# Patient Record
Sex: Female | Born: 1950 | Race: White | Hispanic: No | Marital: Married | Smoking: Never smoker
Health system: Southern US, Community
[De-identification: ages and names within clinical notes are randomized; demographics above are authoritative.]

## PROBLEM LIST (undated history)

## (undated) DIAGNOSIS — R011 Cardiac murmur, unspecified: Secondary | ICD-10-CM

## (undated) DIAGNOSIS — M1990 Unspecified osteoarthritis, unspecified site: Secondary | ICD-10-CM

## (undated) DIAGNOSIS — K219 Gastro-esophageal reflux disease without esophagitis: Secondary | ICD-10-CM

## (undated) DIAGNOSIS — D649 Anemia, unspecified: Secondary | ICD-10-CM

## (undated) DIAGNOSIS — I350 Nonrheumatic aortic (valve) stenosis: Secondary | ICD-10-CM

## (undated) DIAGNOSIS — E7800 Pure hypercholesterolemia, unspecified: Secondary | ICD-10-CM

## (undated) DIAGNOSIS — R931 Abnormal findings on diagnostic imaging of heart and coronary circulation: Secondary | ICD-10-CM

## (undated) DIAGNOSIS — I10 Essential (primary) hypertension: Secondary | ICD-10-CM

## (undated) DIAGNOSIS — I509 Heart failure, unspecified: Secondary | ICD-10-CM

## (undated) DIAGNOSIS — E119 Type 2 diabetes mellitus without complications: Secondary | ICD-10-CM

## (undated) DIAGNOSIS — Z95820 Peripheral vascular angioplasty status with implants and grafts: Secondary | ICD-10-CM

## (undated) DIAGNOSIS — I5030 Unspecified diastolic (congestive) heart failure: Secondary | ICD-10-CM

## (undated) DIAGNOSIS — I2510 Atherosclerotic heart disease of native coronary artery without angina pectoris: Secondary | ICD-10-CM

## (undated) DIAGNOSIS — G43909 Migraine, unspecified, not intractable, without status migrainosus: Secondary | ICD-10-CM

## (undated) DIAGNOSIS — Z9289 Personal history of other medical treatment: Secondary | ICD-10-CM

## (undated) HISTORY — DX: Heart failure, unspecified: I50.9

## (undated) HISTORY — PX: BREAST BIOPSY: SHX20

## (undated) HISTORY — PX: CARDIAC CATHETERIZATION: SHX172

---
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
## 2004-11-29 ENCOUNTER — Other Ambulatory Visit: Admission: RE | Admit: 2004-11-29 | Discharge: 2004-11-29 | Payer: Self-pay | Admitting: Obstetrics and Gynecology

## 2004-12-08 ENCOUNTER — Ambulatory Visit (HOSPITAL_COMMUNITY): Admission: RE | Admit: 2004-12-08 | Discharge: 2004-12-08 | Payer: Self-pay | Admitting: Unknown Physician Specialty

## 2006-02-26 ENCOUNTER — Ambulatory Visit (HOSPITAL_COMMUNITY): Admission: RE | Admit: 2006-02-26 | Discharge: 2006-02-26 | Payer: Self-pay | Admitting: Obstetrics and Gynecology

## 2006-07-17 ENCOUNTER — Other Ambulatory Visit: Admission: RE | Admit: 2006-07-17 | Discharge: 2006-07-17 | Payer: Self-pay | Admitting: Obstetrics and Gynecology

## 2006-08-17 ENCOUNTER — Ambulatory Visit: Payer: Self-pay | Admitting: Gastroenterology

## 2007-04-26 ENCOUNTER — Ambulatory Visit (HOSPITAL_COMMUNITY): Admission: RE | Admit: 2007-04-26 | Discharge: 2007-04-26 | Payer: Self-pay | Admitting: Obstetrics and Gynecology

## 2007-08-13 ENCOUNTER — Other Ambulatory Visit: Admission: RE | Admit: 2007-08-13 | Discharge: 2007-08-13 | Payer: Self-pay | Admitting: Obstetrics and Gynecology

## 2008-04-30 ENCOUNTER — Ambulatory Visit (HOSPITAL_COMMUNITY): Admission: RE | Admit: 2008-04-30 | Discharge: 2008-04-30 | Payer: Self-pay | Admitting: Obstetrics and Gynecology

## 2009-05-12 ENCOUNTER — Ambulatory Visit (HOSPITAL_COMMUNITY): Admission: RE | Admit: 2009-05-12 | Discharge: 2009-05-12 | Payer: Self-pay | Admitting: Obstetrics and Gynecology

## 2010-05-16 ENCOUNTER — Ambulatory Visit (HOSPITAL_COMMUNITY): Admission: RE | Admit: 2010-05-16 | Discharge: 2010-05-16 | Payer: Self-pay | Admitting: Obstetrics and Gynecology

## 2011-05-10 ENCOUNTER — Other Ambulatory Visit: Payer: Self-pay | Admitting: Obstetrics and Gynecology

## 2011-05-10 DIAGNOSIS — Z1231 Encounter for screening mammogram for malignant neoplasm of breast: Secondary | ICD-10-CM

## 2011-05-25 ENCOUNTER — Ambulatory Visit (HOSPITAL_COMMUNITY)
Admission: RE | Admit: 2011-05-25 | Discharge: 2011-05-25 | Disposition: A | Discharge status: Home / Self Care | Payer: BC Managed Care – PPO | Source: Ambulatory Visit | Attending: Obstetrics and Gynecology | Admitting: Obstetrics and Gynecology

## 2011-05-25 DIAGNOSIS — Z1231 Encounter for screening mammogram for malignant neoplasm of breast: Secondary | ICD-10-CM | POA: Insufficient documentation

## 2012-05-07 ENCOUNTER — Other Ambulatory Visit: Payer: Self-pay | Admitting: Obstetrics and Gynecology

## 2012-05-07 DIAGNOSIS — Z1231 Encounter for screening mammogram for malignant neoplasm of breast: Secondary | ICD-10-CM

## 2012-05-28 ENCOUNTER — Ambulatory Visit (HOSPITAL_COMMUNITY)
Admission: RE | Admit: 2012-05-28 | Discharge: 2012-05-28 | Disposition: A | Discharge status: Home / Self Care | Payer: BC Managed Care – PPO | Source: Ambulatory Visit | Attending: Obstetrics and Gynecology | Admitting: Obstetrics and Gynecology

## 2012-05-28 DIAGNOSIS — Z1231 Encounter for screening mammogram for malignant neoplasm of breast: Secondary | ICD-10-CM | POA: Insufficient documentation

## 2013-04-22 ENCOUNTER — Encounter: Payer: Self-pay | Admitting: Obstetrics and Gynecology

## 2013-04-28 ENCOUNTER — Encounter: Payer: Self-pay | Admitting: Gynecology

## 2013-04-28 ENCOUNTER — Ambulatory Visit: Payer: Self-pay | Admitting: Obstetrics and Gynecology

## 2013-04-28 ENCOUNTER — Ambulatory Visit (INDEPENDENT_AMBULATORY_CARE_PROVIDER_SITE_OTHER): Payer: BC Managed Care – PPO | Admitting: Gynecology

## 2013-04-28 VITALS — BP 140/80 | HR 80 | Resp 14 | Ht 62.0 in | Wt 195.0 lb

## 2013-04-28 DIAGNOSIS — I10 Essential (primary) hypertension: Secondary | ICD-10-CM

## 2013-04-28 DIAGNOSIS — Z01419 Encounter for gynecological examination (general) (routine) without abnormal findings: Secondary | ICD-10-CM

## 2013-04-28 DIAGNOSIS — Z124 Encounter for screening for malignant neoplasm of cervix: Secondary | ICD-10-CM

## 2013-04-28 DIAGNOSIS — E7800 Pure hypercholesterolemia, unspecified: Secondary | ICD-10-CM | POA: Insufficient documentation

## 2013-04-28 NOTE — Progress Notes (Signed)
62 y.o. Married Caucasian female   [phone] here for annual exam. Pt reports menses absent.  She does not report hot flashes, does not have night sweats, does have vaginal dryness.  She is not using lubricants.  She does not report post-menopasual bleeding.  Pt had never filled estrace cream due to lack of sexual activity.  Prior to hurting knee, pt used to clog on regular basis.    Patient's last menstrual period was 08/07/1998.          Sexually active: yes  The current method of family planning is status post menopausal. Exercising: yes  The patient does not participate in regular exercise at present. Last pap: 01/14/2010 Abnormal PAP: no Mammogram: 05/2012 BSE: not monthly  Colonoscopy:  2010 DEXA: no Alcohol: 3-4 drinks/wk Tobacco: no  Hgb: PCP ; Urine: PCP  Health Maintenance  Topic Date Due  . Pap Smear  05/28/1968  . Colonoscopy  05/28/2000  . Zostavax  05/28/2010  . Influenza Vaccine  03/07/2013  . Mammogram  05/28/2014  . Tetanus/tdap  08/07/2017    Family History  Problem Relation Age of Onset  . Hypertension Mother   . Heart disease Mother     There are no active problems to display for this patient.   History reviewed. No pertinent past medical history.  Past Surgical History  Procedure Laterality Date  . Appendectomy    . Breast biopsy Right     2- benign  . Stomach surgery  1965    Hairball removed from stomach    Allergies: Review of patient's allergies indicates no known allergies.  Current Outpatient Prescriptions  Medication Sig Dispense Refill  . CALCIUM PO Take by mouth 2 (two) times daily.      . Cholecalciferol (VITAMIN D) 2000 UNITS CAPS Take by mouth.      . IBUPROFEN PO Take by mouth.      . Multiple Vitamins-Minerals (MULTIVITAMIN PO) Take by mouth.      Marland Kitchen lisinopril-hydrochlorothiazide (PRINZIDE,ZESTORETIC) 20-12.5 MG per tablet       . SIMVASTATIN PO Take by mouth.       No current facility-administered medications for this visit.     ROS: Pertinent items are noted in HPI.  Exam:    BP 140/80  Pulse 80  Resp 14  Ht 5\' 2"  (1.575 m)  Wt 195 lb (88.451 kg)  BMI 35.66 kg/m2  LMP 08/07/1998 Weight change: @WEIGHTCHANGE @ Last 3 height recordings:  Ht Readings from Last 3 Encounters:  04/28/13 5\' 2"  (1.575 m)   General appearance: alert, cooperative and appears stated age Head: Normocephalic, without obvious abnormality, atraumatic Neck: no adenopathy, no carotid bruit, no JVD, supple, symmetrical, trachea midline and thyroid not enlarged, symmetric, no tenderness/mass/nodules Lungs: clear to auscultation bilaterally Breasts: normal appearance, no masses or tenderness Heart: regular rate and rhythm, holosystolic flow murmur II/IV mitral Abdomen: soft, non-tender; bowel sounds normal; no masses,  no organomegaly Extremities: extremities normal, atraumatic, no cyanosis or edema Skin: Skin color, texture, turgor normal. No rashes or lesions Lymph nodes: Cervical, supraclavicular, and axillary nodes normal. no inguinal nodes palpated Neurologic: Grossly normal   Pelvic: External genitalia:  atrophic appearance              Urethra: normal appearing urethra with no masses, tenderness or lesions              Bartholins and Skenes: Bartholin's, Urethra, Skene's normal  Vagina: atrophic              Cervix: normal appearance              Pap taken: yes        Bimanual Exam:  Uterus:  uterus is normal size, shape, consistency and nontender                                      Adnexa:    no masses                                      Rectovaginal: Confirms                                      Anus:  normal sphincter tone, no lesions  A: well woman Menopause Mitral flow murmur     P: mammogram due, BSE stressed pap smear with HRHPV-guidelines reviewed Pt with echo-5y before, sees Dr Kevan Ny, no antibiotic prophylaxis counseled on breast self exam, mammography screening, menopause, osteoporosis,  adequate intake of calcium and vitamin D, diet and exercise return annually or prn Discussed PAP guideline changes, importance of weight bearing exercises, calcium, vit D and balanced diet.  An After Visit Summary was printed and given to the patient.

## 2013-04-28 NOTE — Patient Instructions (Signed)

## 2013-05-07 ENCOUNTER — Other Ambulatory Visit: Payer: Self-pay | Admitting: Gynecology

## 2013-05-07 DIAGNOSIS — Z1231 Encounter for screening mammogram for malignant neoplasm of breast: Secondary | ICD-10-CM

## 2013-06-05 ENCOUNTER — Other Ambulatory Visit: Payer: Self-pay | Admitting: Orthopedic Surgery

## 2013-06-05 NOTE — Progress Notes (Signed)
Preoperative surgical orders have been place into the Epic hospital system for Kerry Perez on 06/05/2013, 1:01 PM  by Patrica Duel for surgery on 06/18/2013.  Preop Knee Scope orders including IV Tylenol and IV Decadron as long as there are no contraindications to the above medications. Avel Peace, PA-C

## 2013-06-12 ENCOUNTER — Encounter (HOSPITAL_COMMUNITY): Payer: Self-pay | Admitting: Pharmacy Technician

## 2013-06-13 ENCOUNTER — Encounter (HOSPITAL_COMMUNITY): Payer: Self-pay

## 2013-06-13 ENCOUNTER — Ambulatory Visit (HOSPITAL_COMMUNITY)
Admission: RE | Admit: 2013-06-13 | Discharge: 2013-06-13 | Disposition: A | Discharge status: Home / Self Care | Payer: BC Managed Care – PPO | Source: Ambulatory Visit | Attending: Orthopedic Surgery | Admitting: Orthopedic Surgery

## 2013-06-13 ENCOUNTER — Ambulatory Visit (HOSPITAL_COMMUNITY): Admission: RE | Admit: 2013-06-13 | Payer: BC Managed Care – PPO | Source: Ambulatory Visit

## 2013-06-13 ENCOUNTER — Encounter (HOSPITAL_COMMUNITY): Payer: Self-pay | Admitting: *Deleted

## 2013-06-13 ENCOUNTER — Encounter (HOSPITAL_COMMUNITY)
Admission: RE | Admit: 2013-06-13 | Discharge: 2013-06-13 | Disposition: A | Discharge status: Home / Self Care | Payer: BC Managed Care – PPO | Source: Ambulatory Visit | Attending: Orthopedic Surgery | Admitting: Orthopedic Surgery

## 2013-06-13 DIAGNOSIS — IMO0002 Reserved for concepts with insufficient information to code with codable children: Secondary | ICD-10-CM | POA: Insufficient documentation

## 2013-06-13 DIAGNOSIS — Z01810 Encounter for preprocedural cardiovascular examination: Secondary | ICD-10-CM | POA: Insufficient documentation

## 2013-06-13 DIAGNOSIS — Z01812 Encounter for preprocedural laboratory examination: Secondary | ICD-10-CM | POA: Insufficient documentation

## 2013-06-13 DIAGNOSIS — X58XXXA Exposure to other specified factors, initial encounter: Secondary | ICD-10-CM | POA: Insufficient documentation

## 2013-06-13 DIAGNOSIS — M4130 Thoracogenic scoliosis, site unspecified: Secondary | ICD-10-CM | POA: Insufficient documentation

## 2013-06-13 DIAGNOSIS — Z01818 Encounter for other preprocedural examination: Secondary | ICD-10-CM | POA: Insufficient documentation

## 2013-06-13 LAB — BASIC METABOLIC PANEL
BUN: 12 mg/dL (ref 6–23)
Calcium: 9.5 mg/dL (ref 8.4–10.5)
Creatinine, Ser: 0.63 mg/dL (ref 0.50–1.10)
GFR calc Af Amer: >90 mL/min (ref >90)

## 2013-06-13 LAB — CBC
MCH: 29.6 pg (ref 26.0–34.0)
MCHC: 33.9 g/dL (ref 30.0–36.0)
MCV: 87.4 fL (ref 78.0–100.0)
Platelets: 327 10*3/uL (ref 150–400)
RDW: 13 % (ref 11.5–15.5)

## 2013-06-13 NOTE — Patient Instructions (Addendum)
20 SOPHEE MCKIMMY  06/13/2013   Your procedure is scheduled on:  11-12 -2014  Report to [facility] at      0800  AM .  Call this number if you have problems the morning of surgery: [phone]  Or Presurgical Testing [phone](Asalee Barrette)   .   Do not eat food:After Midnight.   Take these medicines the morning of surgery with A SIP OF WATER: none   Do not wear jewelry, make-up or nail polish.  Do not wear lotions, powders, or perfumes. You may wear deodorant.  Do not shave 12 hours prior to first CHG shower(legs and under arms).(face and neck okay.)  Do not bring valuables to the hospital.  Contacts, dentures or removable bridgework, body piercing, hair pins may not be worn into surgery.  Leave suitcase in the car. After surgery it may be brought to your room.  For patients admitted to the hospital, checkout time is 11:00 AM the day of discharge.   Patients discharged the day of surgery will not be allowed to drive home. Must have responsible person with you x 24 hours once discharged.  Name and phone number of your driver: Council Mechanic- spouse [phone] cell  Special Instructions: CHG(Chlorhedine 4%-"Hibiclens","Betasept","Aplicare") Shower Use Special Wash: see special instructions.(avoid face and genitals)   Please read over the following fact sheets that you were given: Incentive Spirometry Instruction.    Failure to follow these instructions may result in Cancellation of your surgery.   Patient signature_______________________________________________________

## 2013-06-13 NOTE — Progress Notes (Signed)
06-13-13 1610 labs viewable in Epic-please note.

## 2013-06-13 NOTE — Pre-Procedure Instructions (Addendum)
06-13-13 EKG/ CXR done today 06-13-13 1610 labs viewable in epic-note to Dr. Deri Fuelling office.

## 2013-06-17 DIAGNOSIS — S83249A Other tear of medial meniscus, current injury, unspecified knee, initial encounter: Secondary | ICD-10-CM

## 2013-06-17 NOTE — H&P (Signed)
  CC- Kerry Perez is a 62 y.o. female who presents with left knee pain.  HPI- . Knee Pain: Patient presents with knee pain involving the  left knee. Onset of the symptoms was several months ago. Inciting event: none known. Current symptoms include giving out, locking, pain located medially and stiffness. Pain is aggravated by lateral movements, pivoting, rising after sitting and squatting.  Patient has had prior knee problems. Evaluation to date: MRI: abnormal medial meniscalt tear. Treatment to date: corticosteroid injection which was not very effective.  Past Medical History  Diagnosis Date  . Hypertension   . Heart murmur   . GERD (gastroesophageal reflux disease)     mild  . Hypercholesterolemia   . Headache(784.0)     migraines, less frequent now  . Arthritis     ? knee issues    Past Surgical History  Procedure Laterality Date  . Appendectomy    . Breast biopsy Right     2- benign  . Stomach surgery  1965    Hairball removed from stomach    Prior to Admission medications   Medication Sig Start Date End Date Taking? Authorizing Provider  calcium-vitamin D (OSCAL WITH D) 500-200 MG-UNIT per tablet Take 1 tablet by mouth 2 (two) times daily.    Historical Provider, MD  Cholecalciferol (VITAMIN D) 2000 UNITS CAPS Take 1 capsule by mouth daily.     Historical Provider, MD  ibuprofen (ADVIL,MOTRIN) 200 MG tablet Take 400 mg by mouth every 6 (six) hours as needed for headache.    Historical Provider, MD  lisinopril-hydrochlorothiazide (PRINZIDE,ZESTORETIC) 20-12.5 MG per tablet Take 1 tablet by mouth every morning.  04/08/13   Historical Provider, MD  Multiple Vitamins-Minerals (MULTIVITAMIN PO) Take 1 tablet by mouth daily.     Historical Provider, MD  simvastatin (ZOCOR) 20 MG tablet Take 20 mg by mouth at bedtime.    Historical Provider, MD   KNEE EXAM antalgic gait, soft tissue tenderness over medial joint line, no effusion, negative drawer sign, collateral ligaments  intact  Physical Examination: General appearance - alert, well appearing, and in no distress Mental status - alert, oriented to person, place, and time Chest - clear to auscultation, no wheezes, rales or rhonchi, symmetric air entry Heart - normal rate, regular rhythm, normal S1, S2, no murmurs, rubs, clicks or gallops Abdomen - soft, nontender, nondistended, no masses or organomegaly Neurological - alert, oriented, normal speech, no focal findings or movement disorder noted   Asessment/Plan--- Left knee medial meniscal tear- - Plan left knee arthroscopy with meniscal debridement. Procedure risks and potential comps discussed with patient who elects to proceed. Goals are decreased pain and increased function with a high likelihood of achieving both

## 2013-06-18 ENCOUNTER — Encounter (HOSPITAL_COMMUNITY)
Admission: RE | Disposition: A | Discharge status: Home / Self Care | Payer: Self-pay | Source: Ambulatory Visit | Attending: Orthopedic Surgery

## 2013-06-18 ENCOUNTER — Ambulatory Visit (HOSPITAL_COMMUNITY)
Admission: RE | Admit: 2013-06-18 | Discharge: 2013-06-18 | Disposition: A | Discharge status: Home / Self Care | Payer: BC Managed Care – PPO | Source: Ambulatory Visit | Attending: Orthopedic Surgery | Admitting: Orthopedic Surgery

## 2013-06-18 ENCOUNTER — Encounter (HOSPITAL_COMMUNITY): Payer: BC Managed Care – PPO | Admitting: Anesthesiology

## 2013-06-18 ENCOUNTER — Encounter (HOSPITAL_COMMUNITY): Payer: Self-pay | Admitting: *Deleted

## 2013-06-18 ENCOUNTER — Ambulatory Visit (HOSPITAL_COMMUNITY): Payer: BC Managed Care – PPO | Admitting: Anesthesiology

## 2013-06-18 DIAGNOSIS — Z79899 Other long term (current) drug therapy: Secondary | ICD-10-CM | POA: Insufficient documentation

## 2013-06-18 DIAGNOSIS — M2240 Chondromalacia patellae, unspecified knee: Secondary | ICD-10-CM | POA: Insufficient documentation

## 2013-06-18 DIAGNOSIS — S83242A Other tear of medial meniscus, current injury, left knee, initial encounter: Secondary | ICD-10-CM

## 2013-06-18 DIAGNOSIS — X58XXXA Exposure to other specified factors, initial encounter: Secondary | ICD-10-CM | POA: Insufficient documentation

## 2013-06-18 DIAGNOSIS — S83249A Other tear of medial meniscus, current injury, unspecified knee, initial encounter: Secondary | ICD-10-CM

## 2013-06-18 DIAGNOSIS — I10 Essential (primary) hypertension: Secondary | ICD-10-CM | POA: Insufficient documentation

## 2013-06-18 DIAGNOSIS — E7800 Pure hypercholesterolemia, unspecified: Secondary | ICD-10-CM | POA: Insufficient documentation

## 2013-06-18 DIAGNOSIS — IMO0002 Reserved for concepts with insufficient information to code with codable children: Secondary | ICD-10-CM | POA: Insufficient documentation

## 2013-06-18 DIAGNOSIS — K219 Gastro-esophageal reflux disease without esophagitis: Secondary | ICD-10-CM | POA: Insufficient documentation

## 2013-06-18 HISTORY — DX: Gastro-esophageal reflux disease without esophagitis: K21.9

## 2013-06-18 HISTORY — DX: Pure hypercholesterolemia, unspecified: E78.00

## 2013-06-18 HISTORY — DX: Cardiac murmur, unspecified: R01.1

## 2013-06-18 HISTORY — DX: Essential (primary) hypertension: I10

## 2013-06-18 HISTORY — PX: KNEE ARTHROSCOPY: SHX127

## 2013-06-18 HISTORY — DX: Unspecified osteoarthritis, unspecified site: M19.90

## 2013-06-18 SURGERY — ARTHROSCOPY, KNEE
Anesthesia: General | Site: Knee | Laterality: Left | Wound class: Clean

## 2013-06-18 MED ORDER — BUPIVACAINE-EPINEPHRINE 0.25% -1:200000 IJ SOLN
INTRAMUSCULAR | Status: DC | PRN
  Administered 2013-06-18: 30 mL

## 2013-06-18 MED ORDER — LACTATED RINGERS IV SOLN: INTRAVENOUS | Status: DC

## 2013-06-18 MED ORDER — FENTANYL CITRATE 0.05 MG/ML IJ SOLN
INTRAMUSCULAR | Status: DC | PRN
  Administered 2013-06-18: 25 ug via INTRAVENOUS
  Administered 2013-06-18: 50 ug via INTRAVENOUS
  Administered 2013-06-18: 25 ug via INTRAVENOUS

## 2013-06-18 MED ORDER — LACTATED RINGERS IR SOLN
Status: DC | PRN
  Administered 2013-06-18: 6000 mL

## 2013-06-18 MED ORDER — LACTATED RINGERS IV SOLN
INTRAVENOUS | Status: DC
  Administered 2013-06-18: 1000 mL via INTRAVENOUS

## 2013-06-18 MED ORDER — ONDANSETRON HCL 4 MG/2ML IJ SOLN
INTRAMUSCULAR | Status: DC | PRN
  Administered 2013-06-18: 4 mg via INTRAVENOUS

## 2013-06-18 MED ORDER — PROMETHAZINE HCL 25 MG/ML IJ SOLN: 6.2500 mg | INTRAMUSCULAR | Status: DC | PRN

## 2013-06-18 MED ORDER — MEPERIDINE HCL 50 MG/ML IJ SOLN: 6.2500 mg | INTRAMUSCULAR | Status: DC | PRN

## 2013-06-18 MED ORDER — FENTANYL CITRATE 0.05 MG/ML IJ SOLN
25.0000 ug | INTRAMUSCULAR | Status: DC | PRN
  Administered 2013-06-18 (×2): 50 ug via INTRAVENOUS

## 2013-06-18 MED ORDER — FENTANYL CITRATE 0.05 MG/ML IJ SOLN
INTRAMUSCULAR | Status: AC
  Filled 2013-06-18: qty 2

## 2013-06-18 MED ORDER — ACETAMINOPHEN 10 MG/ML IV SOLN
1000.0000 mg | Freq: Once | INTRAVENOUS | Status: DC
  Filled 2013-06-18: qty 100

## 2013-06-18 MED ORDER — HYDROMORPHONE HCL PF 1 MG/ML IJ SOLN
INTRAMUSCULAR | Status: AC
  Filled 2013-06-18: qty 1

## 2013-06-18 MED ORDER — HYDROMORPHONE HCL PF 1 MG/ML IJ SOLN
0.2500 mg | INTRAMUSCULAR | Status: DC | PRN
  Administered 2013-06-18: 0.5 mg via INTRAVENOUS

## 2013-06-18 MED ORDER — BUPIVACAINE HCL (PF) 0.25 % IJ SOLN
INTRAMUSCULAR | Status: AC
  Filled 2013-06-18: qty 30

## 2013-06-18 MED ORDER — DEXAMETHASONE SODIUM PHOSPHATE 10 MG/ML IJ SOLN: 10.0000 mg | Freq: Once | INTRAMUSCULAR | Status: DC

## 2013-06-18 MED ORDER — CEFAZOLIN SODIUM-DEXTROSE 2-3 GM-% IV SOLR
2.0000 g | INTRAVENOUS | Status: AC
  Administered 2013-06-18: 2 g via INTRAVENOUS

## 2013-06-18 MED ORDER — CHLORHEXIDINE GLUCONATE 4 % EX LIQD: 60.0000 mL | Freq: Once | CUTANEOUS | Status: DC

## 2013-06-18 MED ORDER — MIDAZOLAM HCL 5 MG/5ML IJ SOLN
INTRAMUSCULAR | Status: DC | PRN
  Administered 2013-06-18: 2 mg via INTRAVENOUS

## 2013-06-18 MED ORDER — PROPOFOL 10 MG/ML IV BOLUS
INTRAVENOUS | Status: DC | PRN
  Administered 2013-06-18: 50 mg via INTRAVENOUS
  Administered 2013-06-18: 200 mg via INTRAVENOUS

## 2013-06-18 MED ORDER — METHOCARBAMOL 100 MG/ML IJ SOLN
500.0000 mg | Freq: Once | INTRAVENOUS | Status: AC
  Administered 2013-06-18: 500 mg via INTRAVENOUS
  Filled 2013-06-18: qty 5

## 2013-06-18 MED ORDER — DEXAMETHASONE SODIUM PHOSPHATE 10 MG/ML IJ SOLN
INTRAMUSCULAR | Status: DC | PRN
  Administered 2013-06-18: 10 mg via INTRAVENOUS

## 2013-06-18 MED ORDER — SODIUM CHLORIDE 0.9 % IV SOLN: INTRAVENOUS | Status: DC

## 2013-06-18 MED ORDER — METHOCARBAMOL 500 MG PO TABS: 500.0000 mg | ORAL_TABLET | Freq: Four times a day (QID) | ORAL | Status: DC

## 2013-06-18 MED ORDER — CEFAZOLIN SODIUM-DEXTROSE 2-3 GM-% IV SOLR
INTRAVENOUS | Status: AC
  Filled 2013-06-18: qty 50

## 2013-06-18 MED ORDER — HYDROCODONE-ACETAMINOPHEN 5-325 MG PO TABS: 1.0000 | ORAL_TABLET | Freq: Four times a day (QID) | ORAL | Status: DC | PRN

## 2013-06-18 SURGICAL SUPPLY — 28 items
BANDAGE ELASTIC 6 VELCRO ST LF (GAUZE/BANDAGES/DRESSINGS) ×2 IMPLANT
BLADE 4.2CUDA (BLADE) ×2 IMPLANT
CLOTH BEACON ORANGE TIMEOUT ST (SAFETY) ×2 IMPLANT
COUNTER NEEDLE 20 DBL MAG RED (NEEDLE) ×2 IMPLANT
CUFF TOURN SGL QUICK 34 (TOURNIQUET CUFF) ×1
CUFF TRNQT CYL 34X4X40X1 (TOURNIQUET CUFF) ×1 IMPLANT
DRAPE U-SHAPE 47X51 STRL (DRAPES) ×2 IMPLANT
DRSG EMULSION OIL 3X3 NADH (GAUZE/BANDAGES/DRESSINGS) ×2 IMPLANT
DURAPREP 26ML APPLICATOR (WOUND CARE) ×2 IMPLANT
GLOVE BIO SURGEON STRL SZ8 (GLOVE) ×2 IMPLANT
GLOVE BIOGEL PI IND STRL 8 (GLOVE) ×2 IMPLANT
GLOVE BIOGEL PI INDICATOR 8 (GLOVE) ×2
GLOVE SURG SS PI 6.5 STRL IVOR (GLOVE) ×2 IMPLANT
GOWN PREVENTION PLUS LG XLONG (DISPOSABLE) ×2 IMPLANT
MANIFOLD NEPTUNE II (INSTRUMENTS) ×4 IMPLANT
PACK ARTHROSCOPY WL (CUSTOM PROCEDURE TRAY) ×2 IMPLANT
PACK ICE MAXI GEL EZY WRAP (MISCELLANEOUS) ×6 IMPLANT
PADDING CAST COTTON 6X4 STRL (CAST SUPPLIES) ×2 IMPLANT
POSITIONER SURGICAL ARM (MISCELLANEOUS) ×2 IMPLANT
SET ARTHROSCOPY TUBING (MISCELLANEOUS) ×1
SET ARTHROSCOPY TUBING LN (MISCELLANEOUS) ×1 IMPLANT
SPONGE GAUZE 4X4 12PLY (GAUZE/BANDAGES/DRESSINGS) ×2 IMPLANT
SUT ETHILON 4 0 PS 2 18 (SUTURE) ×2 IMPLANT
SYR 20CC LL (SYRINGE) ×2 IMPLANT
SYR 30ML LL (SYRINGE) ×2 IMPLANT
TOWEL OR 17X26 10 PK STRL BLUE (TOWEL DISPOSABLE) ×2 IMPLANT
WAND 90 DEG TURBOVAC W/CORD (SURGICAL WAND) ×2 IMPLANT
WRAP KNEE MAXI GEL POST OP (GAUZE/BANDAGES/DRESSINGS) ×4 IMPLANT

## 2013-06-18 NOTE — Preoperative (Signed)
Beta Blockers   Reason not to administer Beta Blockers:Not Applicable 

## 2013-06-18 NOTE — Anesthesia Preprocedure Evaluation (Signed)
Anesthesia Evaluation  Patient identified by MRN, date of birth, ID band Patient awake    Reviewed: Allergy & Precautions, H&P , NPO status , Patient's Chart, lab work & pertinent test results  Airway Mallampati: II TM Distance: >3 FB Neck ROM: Full    Dental no notable dental hx.    Pulmonary neg pulmonary ROS,  breath sounds clear to auscultation  Pulmonary exam normal       Cardiovascular hypertension, Pt. on medications negative cardio ROS  Rhythm:Regular Rate:Normal     Neuro/Psych negative neurological ROS  negative psych ROS   GI/Hepatic negative GI ROS, Neg liver ROS,   Endo/Other  negative endocrine ROS  Renal/GU negative Renal ROS  negative genitourinary   Musculoskeletal negative musculoskeletal ROS (+)   Abdominal   Peds negative pediatric ROS (+)  Hematology negative hematology ROS (+)   Anesthesia Other Findings   Reproductive/Obstetrics negative OB ROS                           Anesthesia Physical Anesthesia Plan  ASA: II  Anesthesia Plan: General   Post-op Pain Management:    Induction: Intravenous  Airway Management Planned: LMA  Additional Equipment:   Intra-op Plan:   Post-operative Plan: Extubation in OR  Informed Consent: I have reviewed the patients History and Physical, chart, labs and discussed the procedure including the risks, benefits and alternatives for the proposed anesthesia with the patient or authorized representative who has indicated his/her understanding and acceptance.   Dental advisory given  Plan Discussed with: CRNA  Anesthesia Plan Comments:         Anesthesia Quick Evaluation  

## 2013-06-18 NOTE — Anesthesia Postprocedure Evaluation (Signed)
Anesthesia Post Note  Patient: Kerry Perez  Procedure(s) Performed: Procedure(s) (LRB): LEFT KNEE ARTHROSCOPY WITH DEBRIDEMENT (Left)  Anesthesia type: General  Patient location: PACU  Post pain: Pain level controlled  Post assessment: Post-op Vital signs reviewed  Last Vitals: BP 130/60  Pulse 70  Temp(Src) 36.2 C (Oral)  Resp 18  SpO2 100%  Post vital signs: Reviewed  Level of consciousness: sedated  Complications: No apparent anesthesia complications

## 2013-06-18 NOTE — Op Note (Signed)
Preoperative diagnosis-  Left knee medial meniscal tear  Postoperative diagnosis Left- knee medial meniscal tear   Procedure- Left knee arthroscopy with medial meniscal debridement    Surgeon- Gus Rankin. Kiran Lapine, MD  Anesthesia-General  EBL-  Minimal  Complications- None  Condition- PACU - hemodynamically stable.  Brief clinical note- -Kerry Perez is a 62 y.o.  female with a several month history of left knee pain and mechanical symptoms. Exam and history suggested medial meniscal tear confirmed by MRI. The patient presents now for arthroscopy and debridement  Procedure in detail -       After successful administration of General anesthetic, a tourmiquet is placed high on the Left  thigh and the Left lower extremity is prepped and draped in the usual sterile fashion. Time out is performed by the surgical team. Standard superomedial and inferolateral portal sites are marked and incisions made with an 11 blade. The inflow cannula is passed through the superomedial portal and camera through the inferolateral portal and inflow is initiated. Arthroscopic visualization proceeds.      The undersurface of the patella and trochlea are visualized and there is grade III chondromalacia central trochlea but no unstable defects. The medial and lateral gutters are visualized and there are  no loose bodies. Flexion and valgus force is applied to the knee and the medial compartment is entered. A spinal needle is passed into the joint through the site marked for the inferomedial portal. A small incision is made and the dilator passed into the joint. The findings for the medial compartment are medial meniscal tear at junction of body and posterior horn coursing back through the posterior horn. There is also grade II chondromalacia medial femoral condyle but no unstable defects . The meniscal tear is debrided to a stable base with baskets and a shaver and sealed off with the Arthrocare.     The intercondylar  notch is visualized and the ACL appears normal. The lateral compartment is entered and the findings are normal .      The joint is again inspected and there are no other tears, defects or loose bodies identified. The arthroscopic equipment is then removed from the inferior portals which are closed with interrupted 4-0 nylon. 20 ml of .25% Marcaine with epinephrine are injected through the inflow cannula and the cannula is then removed and the portal closed with nylon. The incisions are cleaned and dried and a bulky sterile dressing is applied. The patient is then awakened and transported to recovery in stable condition.   06/18/2013, 11:02 AM

## 2013-06-18 NOTE — Transfer of Care (Signed)
Immediate Anesthesia Transfer of Care Note  Patient: Kerry Perez  Procedure(s) Performed: Procedure(s) (LRB): LEFT KNEE ARTHROSCOPY WITH DEBRIDEMENT (Left)  Patient Location: PACU  Anesthesia Type: General  Level of Consciousness: sedated, patient cooperative and responds to stimulation  Airway & Oxygen Therapy: Patient Spontanous Breathing and Patient connected to face mask oxgen  Post-op Assessment: Report given to PACU RN and Post -op Vital signs reviewed and stable  Post vital signs: Reviewed and stable  Complications: No apparent anesthesia complications

## 2013-06-18 NOTE — Interval H&P Note (Signed)
History and Physical Interval Note:  06/18/2013 9:46 AM  Kerry Perez  has presented today for surgery, with the diagnosis of left knee medial mensical tear  The various methods of treatment have been discussed with the patient and family. After consideration of risks, benefits and other options for treatment, the patient has consented to  Procedure(s): LEFT KNEE ARTHROSCOPY WITH DEBRIDEMENT (Left) as a surgical intervention .  The patient's history has been reviewed, patient examined, no change in status, stable for surgery.  I have reviewed the patient's chart and labs.  Questions were answered to the patient's satisfaction.     Loanne Drilling

## 2013-06-19 ENCOUNTER — Encounter (HOSPITAL_COMMUNITY): Payer: Self-pay | Admitting: Orthopedic Surgery

## 2013-07-08 ENCOUNTER — Ambulatory Visit (HOSPITAL_COMMUNITY)
Admission: RE | Admit: 2013-07-08 | Discharge: 2013-07-08 | Disposition: A | Discharge status: Home / Self Care | Payer: BC Managed Care – PPO | Source: Ambulatory Visit | Attending: Gynecology | Admitting: Gynecology

## 2013-07-08 ENCOUNTER — Other Ambulatory Visit: Payer: Self-pay | Admitting: Gynecology

## 2013-07-08 DIAGNOSIS — Z1231 Encounter for screening mammogram for malignant neoplasm of breast: Secondary | ICD-10-CM | POA: Insufficient documentation

## 2013-10-14 ENCOUNTER — Ambulatory Visit (INDEPENDENT_AMBULATORY_CARE_PROVIDER_SITE_OTHER): Payer: BC Managed Care – PPO | Admitting: Cardiology

## 2013-10-14 ENCOUNTER — Ambulatory Visit (HOSPITAL_COMMUNITY)
Admission: RE | Admit: 2013-10-14 | Discharge: 2013-10-14 | Disposition: A | Discharge status: Home / Self Care | Payer: BC Managed Care – PPO | Source: Ambulatory Visit | Attending: Cardiology | Admitting: Cardiology

## 2013-10-14 ENCOUNTER — Encounter (HOSPITAL_COMMUNITY): Payer: Self-pay

## 2013-10-14 ENCOUNTER — Encounter: Payer: Self-pay | Admitting: Cardiology

## 2013-10-14 VITALS — BP 130/80 | HR 62 | Ht 62.0 in | Wt 203.0 lb

## 2013-10-14 DIAGNOSIS — R0609 Other forms of dyspnea: Secondary | ICD-10-CM | POA: Insufficient documentation

## 2013-10-14 DIAGNOSIS — R0600 Dyspnea, unspecified: Secondary | ICD-10-CM

## 2013-10-14 DIAGNOSIS — R079 Chest pain, unspecified: Secondary | ICD-10-CM

## 2013-10-14 DIAGNOSIS — R0989 Other specified symptoms and signs involving the circulatory and respiratory systems: Principal | ICD-10-CM | POA: Insufficient documentation

## 2013-10-14 DIAGNOSIS — R0789 Other chest pain: Secondary | ICD-10-CM

## 2013-10-14 DIAGNOSIS — I2510 Atherosclerotic heart disease of native coronary artery without angina pectoris: Secondary | ICD-10-CM | POA: Insufficient documentation

## 2013-10-14 MED ORDER — IOHEXOL 350 MG/ML SOLN
80.0000 mL | Freq: Once | INTRAVENOUS | Status: AC | PRN
  Administered 2013-10-14: 80 mL via INTRAVENOUS

## 2013-10-14 NOTE — Progress Notes (Signed)
Patient ID: Kerry Perez Pooler, female   DOB: 07-18-1951, 63 y.o.   MRN: [id number]    Patient Name: Kerry Perez Pendley Date of Encounter: 10/14/2013  Primary Care Provider:  Hollice EspyGATES,DONNA RUTH, MD Primary Cardiologist:  Lars MassonNELSON, Rhylei Mcquaig H  Problem List   Past Medical History  Diagnosis Date  . Hypertension   . Heart murmur   . GERD (gastroesophageal reflux disease)     mild  . Hypercholesterolemia   . Headache(784.0)     migraines, less frequent now  . Arthritis     ? knee issues   Past Surgical History  Procedure Laterality Date  . Appendectomy    . Breast biopsy Right     2- benign  . Stomach surgery  1965    Hairball removed from stomach  . Knee arthroscopy Left 06/18/2013    Procedure: LEFT KNEE ARTHROSCOPY WITH DEBRIDEMENT;  Surgeon: Loanne DrillingFrank V Aluisio, MD;  Location: [facility];  Service: Orthopedics;  Laterality: Left;    Allergies  No Known Allergies  HPI  Pleasant 63 year old female with prior medical history of well-controlled hypertension and hyperlipidemia who has been referred by her primary care physician for concerns of chest pain and dyspnea on exertion. The patient started to feel short of breath on 4 days ago when she was walking downtown and attributed her SOB to bad weather and walking too fast. However her dyspnea persisted and appears with any mild to moderate exertion. She also feels retrosternal, pressure-like chest pain that's persistent and gets worse with exertion. She feels overall very weak. The patient is planning to fly to [place] tomorrow as she just had her first grandchild born. She denies any palpitations or syncope. She has never smoked. Her father had a bypass surgery in his 760s.  Home Medications  Prior to Admission medications   Medication Sig Start Date End Date Taking? Authorizing Provider  aspirin 81 MG tablet Take 81 mg by mouth daily.   Yes Historical Provider, MD  calcium-vitamin D (OSCAL WITH D) 500-200 MG-UNIT per tablet Take  1 tablet by mouth 2 (two) times daily.   Yes Historical Provider, MD  Cholecalciferol (VITAMIN D) 2000 UNITS CAPS Take 1 capsule by mouth daily.    Yes Historical Provider, MD  HYDROcodone-acetaminophen (NORCO) 5-325 MG per tablet Take 1-2 tablets by mouth every 6 (six) hours as needed for moderate pain. 06/18/13  Yes Loanne DrillingFrank V Aluisio, MD  ibuprofen (ADVIL,MOTRIN) 200 MG tablet Take 400 mg by mouth every 6 (six) hours as needed for headache.   Yes Historical Provider, MD  lisinopril-hydrochlorothiazide (PRINZIDE,ZESTORETIC) 20-25 MG per tablet Take 1 tablet by mouth daily.   Yes Historical Provider, MD  methocarbamol (ROBAXIN) 500 MG tablet Take 1 tablet (500 mg total) by mouth 4 (four) times daily. As needed for muscle spasm 06/18/13  Yes Loanne DrillingFrank V Aluisio, MD  Multiple Vitamins-Minerals (MULTIVITAMIN PO) Take 1 tablet by mouth daily.    Yes Historical Provider, MD  nebivolol (BYSTOLIC) 5 MG tablet Take 5 mg by mouth daily.   Yes Historical Provider, MD  simvastatin (ZOCOR) 20 MG tablet Take 20 mg by mouth at bedtime.   Yes Historical Provider, MD    Family History  Family History  Problem Relation Age of Onset  . Hypertension Mother   . Heart disease Mother     Social History  History   Social History  . Marital Status: Married    Spouse Name: N/A    Number of Children: N/A  . Years of  Education: N/A   Occupational History  . Not on file.   Social History Main Topics  . Smoking status: Never Smoker   . Smokeless tobacco: Not on file  . Alcohol Use: 0.0 oz/week    1-4 Glasses of wine, 3-4 Drinks containing 0.5 oz of alcohol per week     Comment: weekly  . Drug Use: No  . Sexual Activity: No   Other Topics Concern  . Not on file   Social History Narrative  . No narrative on file     Review of Systems, as per HPI, otherwise negative General:  No chills, fever, night sweats or weight changes.  Cardiovascular:  No chest pain, dyspnea on exertion, edema, orthopnea,  palpitations, paroxysmal nocturnal dyspnea. Dermatological: No rash, lesions/masses Respiratory: No cough, dyspnea Urologic: No hematuria, dysuria Abdominal:   No nausea, vomiting, diarrhea, bright red blood per rectum, melena, or hematemesis Neurologic:  No visual changes, wkns, changes in mental status. All other systems reviewed and are otherwise negative except as noted above.  Physical Exam  Blood pressure 130/80, pulse 62, height 5\' 2"  (1.575 m), weight 203 lb (92.08 kg), last menstrual period 08/07/1998.  General: Pleasant, NAD Psych: Normal affect. Neuro: Alert and oriented X 3. Moves all extremities spontaneously. HEENT: Normal  Neck: Supple without bruits or JVD. Lungs:  Resp regular and unlabored, CTA. Heart: RRR no s3, s4, or murmurs. Abdomen: Soft, non-tender, non-distended, BS + x 4.  Extremities: No clubbing, cyanosis or edema. DP/PT/Radials 2+ and equal bilaterally.  Labs:  No results found for this basename: CKTOTAL, CKMB, TROPONINI,  in the last 72 hours Lab Results  Component Value Date   WBC 6.5 06/13/2013   HGB 12.4 06/13/2013   HCT 36.6 06/13/2013   MCV 87.4 06/13/2013   PLT 327 06/13/2013   No results found for this basename: NA, K, CL, CO2, BUN, CREATININE, CALCIUM, LABALBU, PROT, BILITOT, ALKPHOS, ALT, AST, GLUCOSE,  in the last 168 hours No results found for this basename: CHOL, HDL, LDLCALC, TRIG   No results found for this basename: DDIMER   No components found with this basename: POCBNP,   Accessory Clinical Findings  echocardiogram  ECG - sinus rhythm normal EKG   Assessment & Plan  A very pleasant 63 year old female with prior medical history of hypertension hyperlipidemia and family history of coronary artery disease. The patient is presenting with typical exertional chest pain and shortness of breath. The differential includes coronary artery disease or pulmonary embolism. We would normally order cardiac catheterization however patient has  planned travel tomorrow we will try to perform coronary CT today to rule out the significant coronary stenosis or pulmonary embolism. Patient has normal creatinine, her heart rate is 62 and suitable for coronary CT. Hypertension is controlled.  Hyperlipidemia is followed by PCP.    Lars Masson, MD, [facility] 10/14/2013, 4:34 PM

## 2013-10-14 NOTE — Patient Instructions (Signed)
**Note De-Identified Chere Babson Obfuscation** Your physician has requested that you have cardiac CT. Cardiac computed tomography (CT) is a painless test that uses an x-ray machine to take clear, detailed pictures of your heart. For further information please visit [email or web address] Please follow instruction sheet as given.  Your physician recommends that you schedule a follow-up appointment in: after test

## 2013-10-15 ENCOUNTER — Encounter (HOSPITAL_COMMUNITY): Payer: Self-pay | Admitting: General Practice

## 2013-10-15 ENCOUNTER — Observation Stay (HOSPITAL_COMMUNITY)
Admission: AD | Admit: 2013-10-15 | Discharge: 2013-10-17 | Disposition: A | Discharge status: Home / Self Care | Payer: BC Managed Care – PPO | Source: Ambulatory Visit | Attending: Cardiology | Admitting: Cardiology

## 2013-10-15 ENCOUNTER — Encounter (HOSPITAL_COMMUNITY)
Admission: AD | Disposition: A | Discharge status: Home / Self Care | Payer: BC Managed Care – PPO | Source: Ambulatory Visit | Attending: Cardiology

## 2013-10-15 DIAGNOSIS — I2510 Atherosclerotic heart disease of native coronary artery without angina pectoris: Secondary | ICD-10-CM | POA: Diagnosis present

## 2013-10-15 DIAGNOSIS — R079 Chest pain, unspecified: Secondary | ICD-10-CM | POA: Diagnosis present

## 2013-10-15 DIAGNOSIS — Z79899 Other long term (current) drug therapy: Secondary | ICD-10-CM | POA: Insufficient documentation

## 2013-10-15 DIAGNOSIS — R0989 Other specified symptoms and signs involving the circulatory and respiratory systems: Secondary | ICD-10-CM | POA: Insufficient documentation

## 2013-10-15 DIAGNOSIS — I5031 Acute diastolic (congestive) heart failure: Secondary | ICD-10-CM | POA: Diagnosis present

## 2013-10-15 DIAGNOSIS — K219 Gastro-esophageal reflux disease without esophagitis: Secondary | ICD-10-CM | POA: Insufficient documentation

## 2013-10-15 DIAGNOSIS — Z862 Personal history of diseases of the blood and blood-forming organs and certain disorders involving the immune mechanism: Secondary | ICD-10-CM | POA: Insufficient documentation

## 2013-10-15 DIAGNOSIS — I200 Unstable angina: Secondary | ICD-10-CM | POA: Diagnosis present

## 2013-10-15 DIAGNOSIS — E7800 Pure hypercholesterolemia, unspecified: Secondary | ICD-10-CM

## 2013-10-15 DIAGNOSIS — Z7982 Long term (current) use of aspirin: Secondary | ICD-10-CM | POA: Insufficient documentation

## 2013-10-15 DIAGNOSIS — M1710 Unilateral primary osteoarthritis, unspecified knee: Secondary | ICD-10-CM | POA: Insufficient documentation

## 2013-10-15 DIAGNOSIS — I10 Essential (primary) hypertension: Secondary | ICD-10-CM

## 2013-10-15 DIAGNOSIS — Z9889 Other specified postprocedural states: Secondary | ICD-10-CM | POA: Insufficient documentation

## 2013-10-15 DIAGNOSIS — IMO0002 Reserved for concepts with insufficient information to code with codable children: Secondary | ICD-10-CM

## 2013-10-15 DIAGNOSIS — E119 Type 2 diabetes mellitus without complications: Secondary | ICD-10-CM | POA: Insufficient documentation

## 2013-10-15 DIAGNOSIS — R0789 Other chest pain: Principal | ICD-10-CM | POA: Insufficient documentation

## 2013-10-15 DIAGNOSIS — R0609 Other forms of dyspnea: Secondary | ICD-10-CM | POA: Diagnosis present

## 2013-10-15 DIAGNOSIS — I359 Nonrheumatic aortic valve disorder, unspecified: Secondary | ICD-10-CM | POA: Insufficient documentation

## 2013-10-15 HISTORY — DX: Nonrheumatic aortic (valve) stenosis: I35.0

## 2013-10-15 HISTORY — DX: Unspecified diastolic (congestive) heart failure: I50.30

## 2013-10-15 HISTORY — DX: Abnormal findings on diagnostic imaging of heart and coronary circulation: R93.1

## 2013-10-15 HISTORY — DX: Anemia, unspecified: D64.9

## 2013-10-15 HISTORY — PX: LEFT HEART CATHETERIZATION WITH CORONARY ANGIOGRAM: SHX5451

## 2013-10-15 HISTORY — DX: Atherosclerotic heart disease of native coronary artery without angina pectoris: I25.10

## 2013-10-15 LAB — BASIC METABOLIC PANEL
BUN: 13 mg/dL (ref 6–23)
CO2: 27 mEq/L (ref 19–32)
Calcium: 9.4 mg/dL (ref 8.4–10.5)
Chloride: 100 mEq/L (ref 96–112)
Creatinine, Ser: 0.76 mg/dL (ref 0.50–1.10)
GFR calc Af Amer: >90 mL/min (ref >90)
GFR calc non Af Amer: 88 mL/min — ABNORMAL LOW (ref >90)
Glucose, Bld: 113 mg/dL — ABNORMAL HIGH (ref 70–99)
Potassium: 4.6 mEq/L (ref 3.7–5.3)
Sodium: 140 mEq/L (ref 137–147)

## 2013-10-15 LAB — CBC WITH DIFFERENTIAL/PLATELET
Basophils Absolute: 0 10*3/uL (ref 0.0–0.1)
Basophils Relative: 1 % (ref 0–1)
Eosinophils Absolute: 0.2 10*3/uL (ref 0.0–0.7)
Eosinophils Relative: 3 % (ref 0–5)
HCT: 33.1 % — ABNORMAL LOW (ref 36.0–46.0)
Hemoglobin: 11.3 g/dL — ABNORMAL LOW (ref 12.0–15.0)
Lymphocytes Relative: 26 % (ref 12–46)
Lymphs Abs: 1.3 10*3/uL (ref 0.7–4.0)
MCH: 30.5 pg (ref 26.0–34.0)
MCHC: 34.1 g/dL (ref 30.0–36.0)
MCV: 89.2 fL (ref 78.0–100.0)
Monocytes Absolute: 0.7 10*3/uL (ref 0.1–1.0)
Monocytes Relative: 13 % — ABNORMAL HIGH (ref 3–12)
Neutro Abs: 2.8 10*3/uL (ref 1.7–7.7)
Neutrophils Relative %: 56 % (ref 43–77)
Platelets: 268 10*3/uL (ref 150–400)
RBC: 3.71 MIL/uL — ABNORMAL LOW (ref 3.87–5.11)
RDW: 12.9 % (ref 11.5–15.5)
WBC: 5 10*3/uL (ref 4.0–10.5)

## 2013-10-15 LAB — PRO B NATRIURETIC PEPTIDE: Pro B Natriuretic peptide (BNP): 648.8 pg/mL — ABNORMAL HIGH (ref 0–125)

## 2013-10-15 LAB — PROTIME-INR
INR: 0.96 (ref 0.00–1.49)
Prothrombin Time: 12.6 seconds (ref 11.6–15.2)

## 2013-10-15 LAB — APTT: aPTT: 31 seconds (ref 24–37)

## 2013-10-15 SURGERY — LEFT HEART CATHETERIZATION WITH CORONARY ANGIOGRAM
Anesthesia: LOCAL

## 2013-10-15 MED ORDER — SIMVASTATIN 20 MG PO TABS
20.0000 mg | ORAL_TABLET | Freq: Every day | ORAL | Status: DC
  Administered 2013-10-15 – 2013-10-16 (×2): 20 mg via ORAL
  Filled 2013-10-15 (×3): qty 1

## 2013-10-15 MED ORDER — SODIUM CHLORIDE 0.9 % IJ SOLN: 3.0000 mL | INTRAMUSCULAR | Status: DC | PRN

## 2013-10-15 MED ORDER — HYDROCHLOROTHIAZIDE 25 MG PO TABS
25.0000 mg | ORAL_TABLET | Freq: Every day | ORAL | Status: DC
  Administered 2013-10-15 – 2013-10-17 (×3): 25 mg via ORAL
  Filled 2013-10-15 (×3): qty 1

## 2013-10-15 MED ORDER — HEPARIN (PORCINE) IN NACL 2-0.9 UNIT/ML-% IJ SOLN
INTRAMUSCULAR | Status: AC
  Filled 2013-10-15: qty 1000

## 2013-10-15 MED ORDER — NITROGLYCERIN 0.2 MG/ML ON CALL CATH LAB
INTRAVENOUS | Status: AC
  Filled 2013-10-15: qty 1

## 2013-10-15 MED ORDER — VITAMIN D 50 MCG (2000 UT) PO CAPS: 1.0000 | ORAL_CAPSULE | Freq: Every day | ORAL | Status: DC

## 2013-10-15 MED ORDER — SODIUM CHLORIDE 0.9 % IV SOLN: INTRAVENOUS | Status: AC

## 2013-10-15 MED ORDER — SODIUM CHLORIDE 0.9 % IJ SOLN
3.0000 mL | Freq: Two times a day (BID) | INTRAMUSCULAR | Status: DC
  Administered 2013-10-16 – 2013-10-17 (×3): 3 mL via INTRAVENOUS

## 2013-10-15 MED ORDER — ACETAMINOPHEN 325 MG PO TABS: 650.0000 mg | ORAL_TABLET | ORAL | Status: DC | PRN

## 2013-10-15 MED ORDER — SODIUM CHLORIDE 0.9 % IV SOLN: 250.0000 mL | INTRAVENOUS | Status: DC | PRN

## 2013-10-15 MED ORDER — FENTANYL CITRATE 0.05 MG/ML IJ SOLN
INTRAMUSCULAR | Status: AC
Start: 2013-10-15 — End: 2013-10-15
  Filled 2013-10-15: qty 2

## 2013-10-15 MED ORDER — LISINOPRIL-HYDROCHLOROTHIAZIDE 20-25 MG PO TABS: 1.0000 | ORAL_TABLET | Freq: Every day | ORAL | Status: DC

## 2013-10-15 MED ORDER — ASPIRIN 81 MG PO CHEW
81.0000 mg | CHEWABLE_TABLET | ORAL | Status: AC
  Administered 2013-10-15: 81 mg via ORAL

## 2013-10-15 MED ORDER — ASPIRIN 81 MG PO TABS: 81.0000 mg | ORAL_TABLET | Freq: Every day | ORAL | Status: DC

## 2013-10-15 MED ORDER — ASPIRIN EC 81 MG PO TBEC
81.0000 mg | DELAYED_RELEASE_TABLET | Freq: Every day | ORAL | Status: DC
  Administered 2013-10-16 – 2013-10-17 (×2): 81 mg via ORAL
  Filled 2013-10-15 (×3): qty 1

## 2013-10-15 MED ORDER — LISINOPRIL 20 MG PO TABS
20.0000 mg | ORAL_TABLET | Freq: Every day | ORAL | Status: DC
  Administered 2013-10-15 – 2013-10-17 (×3): 20 mg via ORAL
  Filled 2013-10-15 (×3): qty 1

## 2013-10-15 MED ORDER — NEBIVOLOL HCL 5 MG PO TABS
5.0000 mg | ORAL_TABLET | Freq: Every evening | ORAL | Status: DC
  Administered 2013-10-15: 5 mg via ORAL
  Filled 2013-10-15 (×2): qty 1

## 2013-10-15 MED ORDER — HEPARIN SODIUM (PORCINE) 5000 UNIT/ML IJ SOLN
5000.0000 [IU] | Freq: Three times a day (TID) | INTRAMUSCULAR | Status: DC
  Administered 2013-10-15 – 2013-10-17 (×5): 5000 [IU] via SUBCUTANEOUS
  Filled 2013-10-15 (×8): qty 1

## 2013-10-15 MED ORDER — ONDANSETRON HCL 4 MG/2ML IJ SOLN: 4.0000 mg | Freq: Four times a day (QID) | INTRAMUSCULAR | Status: DC | PRN

## 2013-10-15 MED ORDER — VITAMIN D3 25 MCG (1000 UNIT) PO TABS
2000.0000 [IU] | ORAL_TABLET | Freq: Every day | ORAL | Status: DC
  Administered 2013-10-16 – 2013-10-17 (×2): 2000 [IU] via ORAL
  Filled 2013-10-15 (×3): qty 2

## 2013-10-15 MED ORDER — NITROGLYCERIN 0.4 MG SL SUBL: 0.4000 mg | SUBLINGUAL_TABLET | SUBLINGUAL | Status: DC | PRN

## 2013-10-15 MED ORDER — CALCIUM CARBONATE-VITAMIN D 500-200 MG-UNIT PO TABS
1.0000 | ORAL_TABLET | Freq: Two times a day (BID) | ORAL | Status: DC
  Administered 2013-10-15 – 2013-10-17 (×4): 1 via ORAL
  Filled 2013-10-15 (×5): qty 1

## 2013-10-15 MED ORDER — SODIUM CHLORIDE 0.9 % IV SOLN: INTRAVENOUS | Status: DC

## 2013-10-15 MED ORDER — LIDOCAINE HCL (PF) 1 % IJ SOLN
INTRAMUSCULAR | Status: AC
  Filled 2013-10-15: qty 30

## 2013-10-15 MED ORDER — VERAPAMIL HCL 2.5 MG/ML IV SOLN
INTRAVENOUS | Status: AC
  Filled 2013-10-15: qty 2

## 2013-10-15 MED ORDER — ADULT MULTIVITAMIN W/MINERALS CH
1.0000 | ORAL_TABLET | Freq: Every day | ORAL | Status: DC
  Administered 2013-10-16 – 2013-10-17 (×2): 1 via ORAL
  Filled 2013-10-15 (×3): qty 1

## 2013-10-15 MED ORDER — MIDAZOLAM HCL 2 MG/2ML IJ SOLN
INTRAMUSCULAR | Status: AC
  Filled 2013-10-15: qty 2

## 2013-10-15 NOTE — Addendum Note (Signed)
Addended by: Lars MassonNELSON, Aedon Deason H on: 10/15/2013 08:14 AM   Modules accepted: Orders

## 2013-10-15 NOTE — H&P (Signed)
See office note by Dr. Delton See from yesterday which serves as this patient's H&P. She has evaluated the patient this morning. Cardiac CT demonstrated concern for significant CAD thus Dr. Delton See arranged for patient to go for cath today. Ronie Spies PA-C  ---------------------------------------------------------------  Patient ID: Kerry Perez, female   DOB: [birth year], 63 y.o.   MRN: [id number]     Patient Name: Kerry Perez Date of Encounter: 10/14/2013  Primary Care Provider:  Hollice Espy, MD Primary Cardiologist:  Lars Masson  Problem List   Past Medical History   Diagnosis  Date   .  Hypertension     .  Heart murmur     .  GERD (gastroesophageal reflux disease)         mild   .  Hypercholesterolemia     .  Headache(784.0)         migraines, less frequent now   .  Arthritis         ? knee issues    Past Surgical History   Procedure  Laterality  Date   .  Appendectomy       .  Breast biopsy  Right         2- benign   .  Stomach surgery    1965       Hairball removed from stomach   .  Knee arthroscopy  Left  06/18/2013       Procedure: LEFT KNEE ARTHROSCOPY WITH DEBRIDEMENT;  Surgeon: Loanne Drilling, MD;  Location: [facility];  Service: Orthopedics;  Laterality: Left;    Allergies No Known Allergies   HPI Pleasant 63 year old female with prior medical history of well-controlled hypertension and hyperlipidemia who has been referred by her primary care physician for concerns of chest pain and dyspnea on exertion. The patient started to feel short of breath on 4 days ago when she was walking downtown and attributed her SOB to bad weather and walking too fast. However her dyspnea persisted and appears with any mild to moderate exertion. She also feels retrosternal, pressure-like chest pain that's persistent and gets worse with exertion. She feels overall very weak. The patient is planning to fly to [place] tomorrow as she just had her first grandchild  born. She denies any palpitations or syncope. She has never smoked. Her father had a bypass surgery in his 43s.  Home Medications   Prior to Admission medications    Medication  Sig  Start Date  End Date  Taking?  Authorizing Provider   aspirin 81 MG tablet  Take 81 mg by mouth daily.      Yes  Historical Provider, MD   calcium-vitamin D (OSCAL WITH D) 500-200 MG-UNIT per tablet  Take 1 tablet by mouth 2 (two) times daily.      Yes  Historical Provider, MD   Cholecalciferol (VITAMIN D) 2000 UNITS CAPS  Take 1 capsule by mouth daily.       Yes  Historical Provider, MD   HYDROcodone-acetaminophen (NORCO) 5-325 MG per tablet  Take 1-2 tablets by mouth every 6 (six) hours as needed for moderate pain.  06/18/13    Yes  Loanne Drilling, MD   ibuprofen (ADVIL,MOTRIN) 200 MG tablet  Take 400 mg by mouth every 6 (six) hours as needed for headache.      Yes  Historical Provider, MD   lisinopril-hydrochlorothiazide (PRINZIDE,ZESTORETIC) 20-25 MG per tablet  Take 1 tablet by mouth daily.      Yes  Historical Provider, MD   methocarbamol (ROBAXIN) 500 MG tablet  Take 1 tablet (500 mg total) by mouth 4 (four) times daily. As needed for muscle spasm  06/18/13    Yes  Loanne DrillingFrank V Aluisio, MD   Multiple Vitamins-Minerals (MULTIVITAMIN PO)  Take 1 tablet by mouth daily.       Yes  Historical Provider, MD   nebivolol (BYSTOLIC) 5 MG tablet  Take 5 mg by mouth daily.      Yes  Historical Provider, MD   simvastatin (ZOCOR) 20 MG tablet  Take 20 mg by mouth at bedtime.      Yes  Historical Provider, MD    Family History   Problem  Relation  Age of Onset   .  Hypertension  Mother     .  Heart disease  Mother      Social History   .  Marital Status:  Married       Spouse Name:  N/A       Number of Children:  N/A   .  Years of Education:  N/A    Occupational History   .  Not on file.     Social History Main Topics   .  Smoking status:  Never Smoker    .  Smokeless tobacco:  Not on file   .  Alcohol Use:  0.0  oz/week       1-4 Glasses of wine, 3-4 Drinks containing 0.5 oz of alcohol per week         Comment: weekly   .  Drug Use:  No   .  Sexual Activity:  No     Other Topics  Concern   .  Not on file   Social History Narrative   .  No narrative on file    Review of Systems, as per HPI, otherwise negative General:  No chills, fever, night sweats or weight changes.   Cardiovascular:  No chest pain, dyspnea on exertion, edema, orthopnea, palpitations, paroxysmal nocturnal dyspnea. Dermatological: No rash, lesions/masses Respiratory: No cough, dyspnea Urologic: No hematuria, dysuria Abdominal:   No nausea, vomiting, diarrhea, bright red blood per rectum, melena, or hematemesis Neurologic:  No visual changes, wkns, changes in mental status. All other systems reviewed and are otherwise negative except as noted above.   Physical Exam Blood pressure 130/80, pulse 62, height 5\' 2"  (1.575 m), weight 203 lb (92.08 kg), last menstrual period 08/07/1998.  General: Pleasant, NAD Psych: Normal affect. Neuro: Alert and oriented X 3. Moves all extremities spontaneously. HEENT: Normal          Neck: Supple without bruits or JVD. Lungs:  Resp regular and unlabored, CTA. Heart: RRR no s3, s4, or murmurs. Abdomen: Soft, non-tender, non-distended, BS + x 4.   Extremities: No clubbing, cyanosis or edema. DP/PT/Radials 2+ and equal bilaterally.   Labs: No results found for this basename: CKTOTAL, CKMB, TROPONINI,  in the last 72 hours Lab Results   Component  Value  Date     WBC  6.5  06/13/2013     HGB  12.4  06/13/2013     HCT  36.6  06/13/2013     MCV  87.4  06/13/2013     PLT  327  06/13/2013    No results found for this basename: NA, K, CL, CO2, BUN, CREATININE, CALCIUM, LABALBU, PROT, BILITOT, ALKPHOS, ALT, AST, GLUCOSE,  in the last 168 hours No results found for this basename: CHOL, HDL, LDLCALC, TRIG  No results found for this basename: DDIMER    No components found with this basename:  POCBNP,    Accessory Clinical Findings echocardiogram  ECG - sinus rhythm normal EKG  Assessment & Plan A very pleasant 63 year old female with prior medical history of hypertension hyperlipidemia and family history of coronary artery disease. The patient is presenting with typical exertional chest pain and shortness of breath. The differential includes coronary artery disease or pulmonary embolism. We would normally order cardiac catheterization however patient has planned travel tomorrow we will try to perform coronary CT today to rule out the significant coronary stenosis or pulmonary embolism. Patient has normal creatinine, her heart rate is 62 and suitable for coronary CT. Hypertension is controlled.   Hyperlipidemia is followed by PCP.    Lars Masson, MD, [facility] 10/14/2013, 4:34 PM

## 2013-10-15 NOTE — CV Procedure (Signed)
   Cardiac Catheterization Procedure Note  Name: Kerry ForgetCatherine J Perez MRN: [id number] DOB: 13-Mar-1951  Procedure: Left Heart Cath, Selective Coronary Angiography, LV angiography  Indication: chest pain with abnormal CTA.  Medications:  Sedation:  1 mg IV Versed, 25 mcg IV Fentanyl  Contrast:  70 mL Omnipaque   Procedural Details: The right wrist was prepped, draped, and anesthetized with 1% lidocaine. Using the modified Seldinger technique, a 5 French sheath was introduced into the right radial artery. 3 mg of verapamil was administered through the sheath, weight-based unfractionated heparin was administered intravenously. A Jackie catheter was used for selective coronary angiography. A pigtail catheter was used for left ventriculography. Catheter exchanges were performed over an exchange length guidewire. There were no immediate procedural complications. A TR band was used for radial hemostasis at the completion of the procedure.  The patient was transferred to the post catheterization recovery area for further monitoring.  Procedural Findings:  Hemodynamics: AO:  141/59   mmHg LV:  142/12    mmHg LVEDP: 20  mmHg  Coronary angiography: Coronary dominance: right   Left Main:  normal  Left Anterior Descending (LAD):  Normal in size and mildly calcified. There is a 50-60% discrete stenosis after the origin of second diagonal. The rest of the midsegment has minor irregularities. There is mild myocardial bridging noted in the midsegment.  1st diagonal (D1):  Normal in size with 30% ostial stenosis.  2nd diagonal (D2):  Normal in size with 20% ostial stenosis.  3rd diagonal (D3):  Small in size with minor irregularities.  Circumflex (LCx):  Normal in size and nondominant. The vessel has minor irregularities.  1st obtuse marginal:  Very small in size.  2nd obtuse marginal:  Normal in size with no significant disease.  3rd obtuse marginal:  Normal in size with minor  irregularities.   Right Coronary Artery: normal in size and dominant. The stent percent proximal stenosis and 20% mid stenosis.  Posterior descending artery: normal in size with no significant disease.  Posterior AV segment: normal in size with no significant disease.  Posterolateral branchs:  No significant disease.  Left ventriculography: Left ventricular systolic function is normal , LVEF is estimated at 60 %, there is no significant mitral regurgitation   Final Conclusions:   1. Moderate mid LAD stenosis with mild mid myocardial bridging.  2.Normal LV systolic function with mildly elevated left ventricular end-diastolic pressure.  Recommendations:  I requested an echocardiogram. Recommend initial medical therapy. If the patient fails medical therapy, consider functional testing or pressure wire interrogation of the LAD  Lorine BearsMuhammad Arida MD, [facility] 10/15/2013, 7:47 PM

## 2013-10-15 NOTE — Progress Notes (Signed)
The patient presented with unstable angina and DOE.  Coronary CT showed 1 vessel CAD in LAD and non-obstructive CAD in RCA and LCX.  Chest CT showed mild CHF and B/L pleural effusions. There is also significant lymphadenopathy in the mediastinum, with no obvious mass in the lung, however not the entire lungs fields were visualized on this study.   We will admit her for left cardiac cath and treatment of CHF. She will an echocardiogram (never had one).   For the mediastinal lymphadenopathy we will repeat large FOV chest CT in 1 months to evaluate for the progression (the differential includes reactive lymphadenopathy vs mass).   Lars MassonELSON, Sheranda Seabrooks H 10/15/2013

## 2013-10-15 NOTE — H&P (Signed)
Lars MassonELSON, Candia Kingsbury H 10/15/2013

## 2013-10-15 NOTE — Interval H&P Note (Signed)
Cath Lab Visit (complete for each Cath Lab visit)  Clinical Evaluation Leading to the Procedure:   ACS: no  Non-ACS:    Anginal Classification: CCS III  Anti-ischemic medical therapy: Minimal Therapy (1 class of medications)  Non-Invasive Test Results: Intermediate-risk stress test findings: cardiac mortality 1-3%/year  Prior CABG: No previous CABG      History and Physical Interval Note:  10/15/2013 6:51 PM  Elio Forgetatherine J Gailey  has presented today for surgery, with the diagnosis of cp  The various methods of treatment have been discussed with the patient and family. After consideration of risks, benefits and other options for treatment, the patient has consented to  Procedure(s): LEFT HEART CATHETERIZATION WITH CORONARY ANGIOGRAM (N/A) as a surgical intervention .  The patient's history has been reviewed, patient examined, no change in status, stable for surgery.  I have reviewed the patient's chart and labs.  Questions were answered to the patient's satisfaction.     Lorine BearsMuhammad Noam Karaffa

## 2013-10-15 NOTE — Addendum Note (Signed)
Addended by: Lars MassonNELSON, Sou Nohr H on: 10/15/2013 08:30 AM   Modules accepted: Orders

## 2013-10-16 DIAGNOSIS — I2510 Atherosclerotic heart disease of native coronary artery without angina pectoris: Secondary | ICD-10-CM

## 2013-10-16 DIAGNOSIS — R0609 Other forms of dyspnea: Secondary | ICD-10-CM

## 2013-10-16 DIAGNOSIS — I200 Unstable angina: Secondary | ICD-10-CM | POA: Diagnosis present

## 2013-10-16 DIAGNOSIS — R0989 Other specified symptoms and signs involving the circulatory and respiratory systems: Secondary | ICD-10-CM

## 2013-10-16 DIAGNOSIS — I359 Nonrheumatic aortic valve disorder, unspecified: Secondary | ICD-10-CM

## 2013-10-16 DIAGNOSIS — I10 Essential (primary) hypertension: Secondary | ICD-10-CM

## 2013-10-16 DIAGNOSIS — R079 Chest pain, unspecified: Secondary | ICD-10-CM

## 2013-10-16 DIAGNOSIS — E7800 Pure hypercholesterolemia, unspecified: Secondary | ICD-10-CM

## 2013-10-16 DIAGNOSIS — I5031 Acute diastolic (congestive) heart failure: Secondary | ICD-10-CM

## 2013-10-16 LAB — CBC
HCT: 29.8 % — ABNORMAL LOW (ref 36.0–46.0)
HEMOGLOBIN: 10 g/dL — AB (ref 12.0–15.0)
MCH: 29.9 pg (ref 26.0–34.0)
MCHC: 33.6 g/dL (ref 30.0–36.0)
MCV: 89.2 fL (ref 78.0–100.0)
PLATELETS: 244 10*3/uL (ref 150–400)
RBC: 3.34 MIL/uL — AB (ref 3.87–5.11)
RDW: 13 % (ref 11.5–15.5)
WBC: 5 10*3/uL (ref 4.0–10.5)

## 2013-10-16 LAB — BASIC METABOLIC PANEL
BUN: 12 mg/dL (ref 6–23)
CALCIUM: 9.3 mg/dL (ref 8.4–10.5)
CO2: 24 meq/L (ref 19–32)
Chloride: 99 mEq/L (ref 96–112)
Creatinine, Ser: 0.72 mg/dL (ref 0.50–1.10)
GFR calc Af Amer: >90 mL/min (ref >90)
GFR calc non Af Amer: 89 mL/min — ABNORMAL LOW (ref >90)
GLUCOSE: 83 mg/dL (ref 70–99)
Potassium: 4.2 mEq/L (ref 3.7–5.3)
SODIUM: 138 meq/L (ref 137–147)

## 2013-10-16 MED ORDER — ISOSORBIDE MONONITRATE ER 30 MG PO TB24
30.0000 mg | ORAL_TABLET | Freq: Every day | ORAL | Status: DC
  Administered 2013-10-16 – 2013-10-17 (×2): 30 mg via ORAL
  Filled 2013-10-16 (×2): qty 1

## 2013-10-16 MED ORDER — FUROSEMIDE 10 MG/ML IJ SOLN
40.0000 mg | Freq: Once | INTRAMUSCULAR | Status: AC
  Administered 2013-10-16: 40 mg via INTRAVENOUS
  Filled 2013-10-16: qty 4

## 2013-10-16 MED ORDER — METOPROLOL SUCCINATE ER 25 MG PO TB24
25.0000 mg | ORAL_TABLET | Freq: Every day | ORAL | Status: DC
  Administered 2013-10-17: 25 mg via ORAL
  Filled 2013-10-16: qty 1

## 2013-10-16 NOTE — Progress Notes (Signed)
UR completed 

## 2013-10-16 NOTE — Progress Notes (Signed)
DAILY PROGRESS NOTE  Subjective:  No chest pain currently. Cath showed moderate LAD lesion - FFR was not performed. CTA suggested more likely severe LAD stenosis. She reported typical angina and dyspnea with exertion. An echocardiogram was scheduled for today.  BNP is elevated at 648.  Apparently she was planning on flying to [place] this weekend to see her new granddaughter.   Objective:  Temp:  [98 F (36.7 C)] 98 F (36.7 C) (03/12 0529) Pulse Rate:  [54-62] 54 (03/12 0529) Resp:  [16-18] 18 (03/12 0529) BP: (117-173)/(45-74) 130/53 mmHg (03/12 0529) SpO2:  [96 %-98 %] 98 % (03/12 0529) Weight change:   Intake/Output from previous day: 03/11 0701 - 03/12 0700 In: 436 [P.O.:436] Out: 1100 [Urine:1100]  Intake/Output from this shift: Total I/O In: -  Out: 300 [Urine:300]  Medications: Current Facility-Administered Medications  Medication Dose Route Frequency Provider Last Rate Last Dose  . 0.9 %  sodium chloride infusion  250 mL Intravenous PRN Dorothy Spark, MD      . acetaminophen (TYLENOL) tablet 650 mg  650 mg Oral Q4H PRN Dayna N Dunn, PA-C      . aspirin EC tablet 81 mg  81 mg Oral Daily Dorothy Spark, MD   81 mg at 10/16/13 1005  . calcium-vitamin D (OSCAL WITH D) 500-200 MG-UNIT per tablet 1 tablet  1 tablet Oral BID Dayna N Dunn, PA-C   1 tablet at 10/16/13 1005  . cholecalciferol (VITAMIN D) tablet 2,000 Units  2,000 Units Oral Daily Dorothy Spark, MD   2,000 Units at 10/16/13 1005  . heparin injection 5,000 Units  5,000 Units Subcutaneous 3 times per day Dorothy Spark, MD   5,000 Units at 10/16/13 0511  . lisinopril (PRINIVIL,ZESTRIL) tablet 20 mg  20 mg Oral Daily Dorothy Spark, MD   20 mg at 10/16/13 1005   And  . hydrochlorothiazide (HYDRODIURIL) tablet 25 mg  25 mg Oral Daily Dorothy Spark, MD   25 mg at 10/16/13 1005  . multivitamin with minerals tablet 1 tablet  1 tablet Oral Daily Dayna N Dunn, PA-C   1 tablet at 10/16/13 1005    . nebivolol (BYSTOLIC) tablet 5 mg  5 mg Oral QPM Dayna N Dunn, PA-C   5 mg at 10/15/13 1828  . nitroGLYCERIN (NITROSTAT) SL tablet 0.4 mg  0.4 mg Sublingual Q5 Min x 3 PRN Dayna N Dunn, PA-C      . ondansetron (ZOFRAN) injection 4 mg  4 mg Intravenous Q6H PRN Dayna N Dunn, PA-C      . simvastatin (ZOCOR) tablet 20 mg  20 mg Oral QHS Dayna N Dunn, PA-C   20 mg at 10/15/13 2222  . sodium chloride 0.9 % injection 3 mL  3 mL Intravenous Q12H Dorothy Spark, MD   3 mL at 10/16/13 1005  . sodium chloride 0.9 % injection 3 mL  3 mL Intravenous PRN Dorothy Spark, MD        Physical Exam: General appearance: alert and no distress Lungs: rales bibasilar Heart: regular rate and rhythm, S1, S2 normal, no murmur, click, rub or gallop Abdomen: soft, non-tender; bowel sounds normal; no masses,  no organomegaly Extremities: extremities normal, atraumatic, no cyanosis or edema  Lab Results: Results for orders placed during the hospital encounter of 10/15/13 (from the past 48 hour(s))  APTT     Status: None   Collection Time    10/15/13 10:50 AM  Result Value Ref Range   aPTT 31  24 - 37 seconds  BASIC METABOLIC PANEL     Status: Abnormal   Collection Time    10/15/13 10:50 AM      Result Value Ref Range   Sodium 140  137 - 147 mEq/L   Potassium 4.6  3.7 - 5.3 mEq/L   Chloride 100  96 - 112 mEq/L   CO2 27  19 - 32 mEq/L   Glucose, Bld 113 (*) 70 - 99 mg/dL   BUN 13  6 - 23 mg/dL   Creatinine, Ser 0.76  0.50 - 1.10 mg/dL   Calcium 9.4  8.4 - 10.5 mg/dL   GFR calc non Af Amer 88 (*) >90 mL/min   GFR calc Af Amer >90  >90 mL/min   Comment: (NOTE)     The eGFR has been calculated using the CKD EPI equation.     This calculation has not been validated in all clinical situations.     eGFR's persistently <90 mL/min signify possible Chronic Kidney     Disease.  CBC WITH DIFFERENTIAL     Status: Abnormal   Collection Time    10/15/13 10:50 AM      Result Value Ref Range   WBC 5.0  4.0  - 10.5 K/uL   RBC 3.71 (*) 3.87 - 5.11 MIL/uL   Hemoglobin 11.3 (*) 12.0 - 15.0 g/dL   HCT 33.1 (*) 36.0 - 46.0 %   MCV 89.2  78.0 - 100.0 fL   MCH 30.5  26.0 - 34.0 pg   MCHC 34.1  30.0 - 36.0 g/dL   RDW 12.9  11.5 - 15.5 %   Platelets 268  150 - 400 K/uL   Neutrophils Relative % 56  43 - 77 %   Neutro Abs 2.8  1.7 - 7.7 K/uL   Lymphocytes Relative 26  12 - 46 %   Lymphs Abs 1.3  0.7 - 4.0 K/uL   Monocytes Relative 13 (*) 3 - 12 %   Monocytes Absolute 0.7  0.1 - 1.0 K/uL   Eosinophils Relative 3  0 - 5 %   Eosinophils Absolute 0.2  0.0 - 0.7 K/uL   Basophils Relative 1  0 - 1 %   Basophils Absolute 0.0  0.0 - 0.1 K/uL  PRO B NATRIURETIC PEPTIDE     Status: Abnormal   Collection Time    10/15/13 10:50 AM      Result Value Ref Range   Pro B Natriuretic peptide (BNP) 648.8 (*) 0 - 125 pg/mL  PROTIME-INR     Status: None   Collection Time    10/15/13 10:50 AM      Result Value Ref Range   Prothrombin Time 12.6  11.6 - 15.2 seconds   INR 0.96  0.00 - 4.13  BASIC METABOLIC PANEL     Status: Abnormal   Collection Time    10/16/13  4:08 AM      Result Value Ref Range   Sodium 138  137 - 147 mEq/L   Potassium 4.2  3.7 - 5.3 mEq/L   Chloride 99  96 - 112 mEq/L   CO2 24  19 - 32 mEq/L   Glucose, Bld 83  70 - 99 mg/dL   BUN 12  6 - 23 mg/dL   Creatinine, Ser 0.72  0.50 - 1.10 mg/dL   Calcium 9.3  8.4 - 10.5 mg/dL   GFR calc non Af Amer 89 (*) >90  mL/min   GFR calc Af Amer >90  >90 mL/min   Comment: (NOTE)     The eGFR has been calculated using the CKD EPI equation.     This calculation has not been validated in all clinical situations.     eGFR's persistently <90 mL/min signify possible Chronic Kidney     Disease.  CBC     Status: Abnormal   Collection Time    10/16/13  4:08 AM      Result Value Ref Range   WBC 5.0  4.0 - 10.5 K/uL   RBC 3.34 (*) 3.87 - 5.11 MIL/uL   Hemoglobin 10.0 (*) 12.0 - 15.0 g/dL   HCT 29.8 (*) 36.0 - 46.0 %   MCV 89.2  78.0 - 100.0 fL   MCH 29.9   26.0 - 34.0 pg   MCHC 33.6  30.0 - 36.0 g/dL   RDW 13.0  11.5 - 15.5 %   Platelets 244  150 - 400 K/uL    Imaging: Ct Heart Morp W/cta Cor W/score W/ca W/cm &/or Wo/cm  10/14/2013   CLINICAL DATA Dyspnea on exertion, chest pain  EXAM Cardiac/Coronary  CT  TECHNIQUE The patient was scanned on a Philips 256 scanner.  FINDINGS A 120 kV prospective scan was triggered in the descending thoracic aorta at 111 HU's. Axial non-contrast 78m slices were carried out through the heart. The data set was analyzed on a dedicated work station and scored using the ARenovo Gantry rotation speed was 270 msecs and collimation was .9 mm. No beta blockade and 0.4 mg of sl NTG was given. The 3D data set was reconstructed in 5% intervals of the 67-82 % of the R-R cycle. Diastolic phases were analyzed on a dedicated work station using MPR, MIP and VRT modes. The patient received 80 cc of contrast.  The quality of the study is affected by patient's size (BMI 37) and mild motion.  Aorta: Normal size of the aortic root, ascending aorta, aortic arch and descending aorta. Mild focal calcifications in the ascending aorta and aortic arch.  Aortic Valve:  Trileaflet, no calcifications.  Coronary Arteries:  Normal origin, right dominance.  Left main is a large vessel that gives rise to LAD and LCX artery.  Left main has minimal ostial non-calficied plague associated with 0-25% stenosis.  LAD is a large caliber, long vessel that wraps around the apex and extends to at least half of the inferior interventricular groove. It gives rise to 3 diagonal branches. The ostial LAD has moderate mixed plague with associated stenosis 50-70%. Mid LAD at the takeoff of the second diagonal branch has severe calcified plague with associated stenosis > 70%. The severity of this lesion might be overestimated by significant calcification and blooming artifact. Distal LAD has minimal calcified plague with associated stenosis 0-25%.  First diagonal branch  is rather small and free of plague.  Second diagonal branch is moderate caliber vessel that has mild mixed ostial plague with associated stenosis 25-50%.  Third diagonal branch is very distal and small vessel without obvious plague.  Left circumflex artery is a moderate caliber non-dominant caliber vessel that gives rise to two large obtuse marginal branches. LCX has a mild calcified plague in the proximal segment with associated 25-50% stenosis.  Both obtuse marginal branches are poorly visualized secondary to patient's size but appears to only have minimal plague.  RCA is a very large dominant vessel that gives rise to PDA and PLVB.  Proximal RCA has mild non-calcified  plague associated with 25-50 % stenosis. This lesion is associated with negative remodeling and therefore represent unstable lesion. There is another minimal calcified plague in the proximal RCA associated with 0-25% stenosis. There is minimal calcified plague in the mid RCA associated with 0-25% stenosis.  There is no obvious plague in the distal RCA, PDA or PLVB.  IMPRESSION 1. Coronary calcium score of 144. This was 45 percentile for age and sex matched control.  2.  Normal origin or coronary arteries, right dominance.  3. One vessel coronary artery disease with significant lesions in the mid and possibly proximal LAD. Cardiac catheterization is recommended.  Ena Dawley  SIGNATURE  Electronically Signed   By: Ena Dawley   On: 10/14/2013 21:59    Assessment:  Principal Problem:   Unstable angina Active Problems:   DOE (dyspnea on exertion)   Chest pain   Acute diastolic heart failure, NYHA class 2   CAD (coronary artery disease)   Plan:  Symptoms are suggestive of unstable angina.  It appears she has, mostly likely, diastolic heart failure possibly related to ischemia. Cath yesterday did not evaluate the functional significance of her moderate to severe lesion. Discussed with Dr. Meda Coffee - she wishes to have a functional  exercise myoview in the hospital to evaluate her LAD lesion. Will give lasix 40 mg IV x 1 today. Add imdur 30 mg daily to her regimen. Check BMP, BNP and FLP in the am, but will likely increase her statin. Change Bystolic to Toprol, which is a better negative chronotrope and will provide adequate bp control. Stress test in the am tomorrow.   Time Spent Directly with Patient:  30 minutes  Length of Stay:  LOS: 1 day   Pixie Casino, MD, [facility] Attending Cardiologist [facility]  Kirsta Probert C 10/16/2013, 12:02 PM

## 2013-10-16 NOTE — Progress Notes (Signed)
TR Band removed at 2215 and pressure dressing applied. Site a Level 0, no bleeding noted.  Pt educated about keeping arm elevated above the heart and limiting movement in that wrist for 24 hours. Will continue to monitor.

## 2013-10-16 NOTE — Progress Notes (Signed)
  Echocardiogram 2D Echocardiogram has been performed.  Arvil ChacoFoster, Binta Statzer 10/16/2013, 10:56 AM

## 2013-10-17 ENCOUNTER — Observation Stay (HOSPITAL_COMMUNITY): Payer: BC Managed Care – PPO

## 2013-10-17 ENCOUNTER — Encounter (HOSPITAL_COMMUNITY): Payer: Self-pay | Admitting: Physician Assistant

## 2013-10-17 DIAGNOSIS — I2510 Atherosclerotic heart disease of native coronary artery without angina pectoris: Secondary | ICD-10-CM

## 2013-10-17 LAB — HEPATIC FUNCTION PANEL
ALK PHOS: 89 U/L (ref 39–117)
ALT: 36 U/L — ABNORMAL HIGH (ref 0–35)
AST: 21 U/L (ref 0–37)
Albumin: 3.3 g/dL — ABNORMAL LOW (ref 3.5–5.2)
Bilirubin, Direct: <0.2 mg/dL (ref 0.0–0.3)
TOTAL PROTEIN: 6.5 g/dL (ref 6.0–8.3)
Total Bilirubin: 0.3 mg/dL (ref 0.3–1.2)

## 2013-10-17 LAB — LIPID PANEL
CHOL/HDL RATIO: 2.3 ratio
Cholesterol: 138 mg/dL (ref 0–200)
HDL: 60 mg/dL (ref >39)
LDL CALC: 58 mg/dL (ref 0–99)
Triglycerides: 98 mg/dL (ref <150)
VLDL: 20 mg/dL (ref 0–40)

## 2013-10-17 LAB — BASIC METABOLIC PANEL
BUN: 13 mg/dL (ref 6–23)
CO2: 27 mEq/L (ref 19–32)
Calcium: 9.7 mg/dL (ref 8.4–10.5)
Chloride: 92 mEq/L — ABNORMAL LOW (ref 96–112)
Creatinine, Ser: 0.81 mg/dL (ref 0.50–1.10)
GFR calc Af Amer: 88 mL/min — ABNORMAL LOW (ref >90)
GFR, EST NON AFRICAN AMERICAN: 76 mL/min — AB (ref >90)
Glucose, Bld: 95 mg/dL (ref 70–99)
Potassium: 3.8 mEq/L (ref 3.7–5.3)
SODIUM: 135 meq/L — AB (ref 137–147)

## 2013-10-17 LAB — PRO B NATRIURETIC PEPTIDE: PRO B NATRI PEPTIDE: 305.7 pg/mL — AB (ref 0–125)

## 2013-10-17 MED ORDER — LIVING BETTER WITH HEART FAILURE BOOK
Freq: Once | Status: AC
  Administered 2013-10-17: 18:00:00
  Filled 2013-10-17: qty 1

## 2013-10-17 MED ORDER — FUROSEMIDE 10 MG/ML IJ SOLN
40.0000 mg | Freq: Once | INTRAMUSCULAR | Status: AC
  Administered 2013-10-17: 40 mg via INTRAVENOUS

## 2013-10-17 MED ORDER — NITROGLYCERIN 0.4 MG SL SUBL: 0.4000 mg | SUBLINGUAL_TABLET | SUBLINGUAL | Status: DC | PRN

## 2013-10-17 MED ORDER — FUROSEMIDE 40 MG PO TABS: 40.0000 mg | ORAL_TABLET | Freq: Every day | ORAL | Status: DC | PRN

## 2013-10-17 MED ORDER — ISOSORBIDE MONONITRATE ER 30 MG PO TB24: 30.0000 mg | ORAL_TABLET | Freq: Every day | ORAL | Status: DC

## 2013-10-17 MED ORDER — FUROSEMIDE 10 MG/ML IJ SOLN: 20.0000 mg | Freq: Once | INTRAMUSCULAR | Status: DC

## 2013-10-17 MED ORDER — TECHNETIUM TC 99M SESTAMIBI GENERIC - CARDIOLITE
30.0000 | Freq: Once | INTRAVENOUS | Status: AC | PRN
  Administered 2013-10-17: 30 via INTRAVENOUS

## 2013-10-17 MED ORDER — TECHNETIUM TC 99M SESTAMIBI GENERIC - CARDIOLITE
10.0000 | Freq: Once | INTRAVENOUS | Status: AC | PRN
  Administered 2013-10-17: 10 via INTRAVENOUS

## 2013-10-17 MED ORDER — METOPROLOL SUCCINATE ER 25 MG PO TB24: 25.0000 mg | ORAL_TABLET | Freq: Every day | ORAL | Status: DC

## 2013-10-17 MED ORDER — ATORVASTATIN CALCIUM 80 MG PO TABS: 80.0000 mg | ORAL_TABLET | Freq: Every evening | ORAL | Status: DC

## 2013-10-17 NOTE — Discharge Summary (Signed)
Discharge Summary   Patient ID: Elio ForgetCatherine J Vieau MRN: [id number], DOB/AGE: 64-Sep-1952 64 y.o. Admit date: 10/15/2013 D/C date:     10/17/2013  Primary Care Provider: Hollice EspyGATES,DONNA RUTH, MD Primary Cardiologist: Delton SeeNelson  Primary Discharge Diagnoses:  1. CAD - moderate LAD lesion by cath 10/15/13 with nonischemic nuclear stress test 10/17/13 2. Acute diastolic CHF - EF 60-65% 3. HTN  4. Hyperlipidemia - due to dx of CAD and h/o diet-controlled DM, statin was increased  5. Mild AS by echo 10/16/13  6. Normocytic anemia; pt denies bleeding  7. Ancillary cardiac CT findings: "Prominent lymphoid tissue in the hilar regions bilaterally is nonspecific. In the setting of potential congestive failure, this may be related to edema and lymphatic congestion, however, clinical correlation for signs and symptoms of the lymphoproliferative disorder or other malignancy is suggested." - no constitutional symptoms - asked to f/u PCP  Secondary Discharge Diagnoses:  1. GERD 2. Diet-controlled diabetes mellitus 3. Arthritis  Hospital Course: Ms. Maury DusGlasgow is a 63 y/o F with history of HTN, HL, GERD, family history of CAD but no prior cardiac history who presented recently to the office for evaluation of CP and dyspnea. The patient started to feel short of breath several days prior to evaluation when she was walking downtown. She attributed her SOB to bad weather and walking too fast. However her dyspnea persisted and appeared to recur with any mild to moderate exertion. She also was feeling retrosternal, pressure-like chest pain that was persistent and worsened with exertion. Symptoms were concerning for unstable angina. Cardiac CT was performed demonstrating calcium score of 144, 96% for age/sex matched control, one vessel CAD with significant lesions in mid and possibly prox LAD. The patient was set up to come to the hospital for cardiac catheterization. She underwent this on 10/15/13 which demonstrated: 1.  Moderate mid LAD stenosis with mild mid myocardial bridging.  2.Normal LV systolic function with mildly elevated left ventricular end-diastolic pressure. FFR was not performed. It was decided that she would require functional testing and thus exercise cardiolite was ordered. In the meantime, in setting of elevated LVEDP and pBNP of 648, she was given a trial of 40mg  IV Lasix. Note that ancillary findings on CT had returned by that time demonstrating possible interstitial edema as well as  "prominent lymphoid tissue in the hilar regions bilaterally is nonspecific. In the setting of potential congestive failure, this may be related to edema and lymphatic congestion, however, clinical correlation for signs and symptoms of the lymphoproliferative disorder or other malignancy is suggested." With the dose of IV Lasix she urinated over 3L. She denied constitutional symptoms including weight loss, fever, night sweats. She was informed of the CT findings (which may be due to acute diastolic CHF) and asked to f/u with her PCP for this. Dr. Rennis GoldenHilty changed Bystolic to Toprol (citing better negative chronotrope) and added Imdur to her regimen.  She underwent exercise nuclear stress testing and went 9:41 on the Bruce protocol, reaching target HR. Nuclear scan was negative for ischemia, particularly in the distribution of the LAD. She did not have any chest pain during exercise. She did have dyspnea but she felt it was actually improved from the exertional dyspnea she had been having at home. Thus it was felt that perhaps some of her symptoms were due to acute diastolic CHF. 2D Echo (10/16/13) showed EF 60-65%, no RWMA, mild LVH, mild AS. Her AS will need to be followed as an outpatient. Dr. Rennis GoldenHilty gave her another dose  of IV Lasix today as her pBNP was still mildly elevated. He has seen and examined her and feels she is stable for discharge this afternoon. She will be given a prescription for PRN Lasix.  Statin was titrated,  thus consider f/u lipids/LFTs in 6 weeks. Dr. Rennis Golden cleared her to fly to [place] to see her new grandson if she remains feeling better (with post-cath lifting instructions as below). She also had Hgb go from 11.3 to 10.0. Some of this may be related to pre/post cath fluids - she denies any known bleeding. She was asked to f/u PCP for this as well.  Discharge Vitals: Blood pressure 120/46, pulse 60, temperature 98.1 F (36.7 C), temperature source Oral, resp. rate 16, height 5\' 2"  (1.575 m), weight 194 lb 6.4 oz (88.179 kg), last menstrual period 08/07/1998, SpO2 95.00%.  Labs: Lab Results  Component Value Date   WBC 5.0 10/16/2013   HGB 10.0* 10/16/2013   HCT 29.8* 10/16/2013   MCV 89.2 10/16/2013   PLT 244 10/16/2013     Recent Labs Lab 10/17/13 0430  NA 135*  K 3.8  CL 92*  CO2 27  BUN 13  CREATININE 0.81  CALCIUM 9.7  PROT 6.5  BILITOT 0.3  ALKPHOS 89  ALT 36*  AST 21  GLUCOSE 95    Lab Results  Component Value Date   CHOL 138 10/17/2013   HDL 60 10/17/2013   LDLCALC 58 10/17/2013   TRIG 98 10/17/2013    Diagnostic Studies/Procedures   Cardiac Cath 10/15/13 Cardiac Catheterization Procedure Note  Name: YANISSA MICHALSKY  MRN: [id number]  DOB: 1951-03-04  Procedure: Left Heart Cath, Selective Coronary Angiography, LV angiography  Indication: chest pain with abnormal CTA.  Medications:  Sedation: 1 mg IV Versed, 25 mcg IV Fentanyl  Contrast: 70 mL Omnipaque  Procedural Details: The right wrist was prepped, draped, and anesthetized with 1% lidocaine. Using the modified Seldinger technique, a 5 French sheath was introduced into the right radial artery. 3 mg of verapamil was administered through the sheath, weight-based unfractionated heparin was administered intravenously. A Jackie catheter was used for selective coronary angiography. A pigtail catheter was used for left ventriculography. Catheter exchanges were performed over an exchange length guidewire. There were no  immediate procedural complications. A TR band was used for radial hemostasis at the completion of the procedure. The patient was transferred to the post catheterization recovery area for further monitoring.  Procedural Findings:  Hemodynamics:  AO: 141/59 mmHg  LV: 142/12 mmHg  LVEDP: 20 mmHg  Coronary angiography:  Coronary dominance: right  Left Main: normal  Left Anterior Descending (LAD): Normal in size and mildly calcified. There is a 50-60% discrete stenosis after the origin of second diagonal. The rest of the midsegment has minor irregularities. There is mild myocardial bridging noted in the midsegment.  1st diagonal (D1): Normal in size with 30% ostial stenosis.  2nd diagonal (D2): Normal in size with 20% ostial stenosis.  3rd diagonal (D3): Small in size with minor irregularities.  Circumflex (LCx): Normal in size and nondominant. The vessel has minor irregularities.  1st obtuse marginal: Very small in size.  2nd obtuse marginal: Normal in size with no significant disease.  3rd obtuse marginal: Normal in size with minor irregularities.  Right Coronary Artery: normal in size and dominant. The stent percent proximal stenosis and 20% mid stenosis.  Posterior descending artery: normal in size with no significant disease.  Posterior AV segment: normal in size with no significant disease.  Posterolateral branchs: No significant disease. Left ventriculography: Left ventricular systolic function is normal , LVEF is estimated at 60 %, there is no significant mitral regurgitation  Final Conclusions:  1. Moderate mid LAD stenosis with mild mid myocardial bridging.  2.Normal LV systolic function with mildly elevated left ventricular end-diastolic pressure.  Recommendations:  I requested an echocardiogram. Recommend initial medical therapy. If the patient fails medical therapy, consider functional testing or pressure wire interrogation of the LAD  Lorine Bears MD, [facility]  2D Echo 10/16/13 -  Left ventricle: The cavity size was normal. Wall thickness was increased in a pattern of mild LVH. Systolic function was normal. The estimated ejection fraction was in the range of 60% to 65%. Wall motion was normal; there were no regional wall motion abnormalities. Left ventricular diastolic function parameters were normal. - Aortic valve: There was very mild stenosis. Mild regurgitation. Valve area: 1.73cm^2(VTI). Valve area: 1.67cm^2 (Vmax). - Right atrium: The atrium was mildly dilated.   Nm Myocar Multi W/spect W/wall Motion / Ef  10/17/2013   CLINICAL DATA:  The patient has coronary artery disease. This study is done to assess for areas of ischemia. The patient walked on the treadmill for 10 minutes. 85% predicted maximum heart weight was obtained. The blood pressure response was normal. There was no chest pain. EKGs develop some nonspecific ST flattening. There were no diagnostic changes.  EXAM: MYOCARDIAL IMAGING WITH SPECT (REST AND EXERCISE)  GATED LEFT VENTRICULAR WALL MOTION STUDY  LEFT VENTRICULAR EJECTION FRACTION  TECHNIQUE: Standard myocardial SPECT imaging was performed after resting intravenous injection of 10 mCi Tc-62m sestamibi. Subsequently, exercise tolerance test was performed by the patient under the supervision of the Cardiology staff. At peak-stress, 30 mCi Tc-34m sestamibi was injected intravenously and standard myocardial SPECT imaging was performed. Quantitative gated imaging was also performed to evaluate left ventricular wall motion, and estimate left ventricular ejection fraction.  COMPARISON:  None.  FINDINGS: The raw data reveals no evidence of excess motion. Tomographic images visually reveal normal uptake in all segments both at rest and with stress. The quantitative analysis raises the question of very slight variation between rest and stress. However this does not appear to be a significant finding. There is no significant reversibility. Wall motion assessment  reveals that wall motion is normal. Ejection fraction is 70%. There are no wall motion abnormalities.  IMPRESSION: The study reveals no diagnostic abnormalities. There are nonspecific ST changes by EKG. The nuclear images revealed no definite evidence of ischemia. In particular there is no definite evidence of ischemia in the distribution of the LAD.   Electronically Signed   By: Willa Rough   On: 10/17/2013 13:47   Ct Heart Morp W/cta Cor W/score W/ca W/cm &/or Wo/cm  10/16/2013   ADDENDUM REPORT: 10/16/2013 18:18  ADDENDUM: OVER-READ INTERPRETATION  CT CHEST  The following report is an over-read performed by radiologist Dr. Llana Aliment of [facility] on 10/16/2013. This over-read does not include interpretation of cardiac or coronary anatomy or pathology. The CTA interpretation by the cardiologist is attached.  FINDINGS: Diffuse interlobular septal thickening throughout the lungs with patchy areas of ground-glass attenuation, favored to represent mild interstitial pulmonary edema. Small calcified granuloma in the left lower lobe. No other larger more suspicious appearing pulmonary nodules or masses are identified. Trace left and small right pleural effusions. Prominent lymphoid tissue in the hilar regions bilaterally, with the largest cluster of hilar lymph nodes on the right side measuring up to 1.8 x 1.9 cm.  Cluster of subcarinal lymph nodes measuring 1.8 x 2.5 cm. Small hiatal hernia. Visualized portions of the upper abdomen are unremarkable. There are no aggressive appearing lytic or blastic lesions noted in the visualized portions of the skeleton.  IMPRESSION: 1. The appearance of the lungs suggests mild interstitial pulmonary edema. Given the presence of bilateral pleural effusions, findings suggest congestive heart failure. 2. Prominent lymphoid tissue in the hilar regions bilaterally is nonspecific. In the setting of potential congestive failure, this may be related to edema and lymphatic  congestion, however, clinical correlation for signs and symptoms of the lymphoproliferative disorder or other malignancy is suggested.   Electronically Signed   By: Trudie Reed M.D.   On: 10/16/2013 18:18   10/16/2013   CLINICAL DATA:  Dyspnea on exertion, chest pain  EXAM: Cardiac/Coronary  CT  TECHNIQUE: The patient was scanned on a Philips 256 scanner.  FINDINGS: A 120 kV prospective scan was triggered in the descending thoracic aorta at 111 HU's. Axial non-contrast 3mm slices were carried out through the heart. The data set was analyzed on a dedicated work station and scored using the Agatson method. Gantry rotation speed was 270 msecs and collimation was .9 mm. No beta blockade and 0.4 mg of sl NTG was given. The 3D data set was reconstructed in 5% intervals of the 67-82 % of the R-R cycle. Diastolic phases were analyzed on a dedicated work station using MPR, MIP and VRT modes. The patient received 80 cc of contrast.  The quality of the study is affected by patient's size (BMI 37) and mild motion.  Aorta: Normal size of the aortic root, ascending aorta, aortic arch and descending aorta. Mild focal calcifications in the ascending aorta and aortic arch.  Aortic Valve:  Trileaflet, no calcifications.  Coronary Arteries:  Normal origin, right dominance.  Left main is a large vessel that gives rise to LAD and LCX artery.  Left main has minimal ostial non-calficied plague associated with 0-25% stenosis.  LAD is a large caliber, long vessel that wraps around the apex and extends to at least half of the inferior interventricular groove. It gives rise to 3 diagonal branches. The ostial LAD has moderate mixed plague with associated stenosis 50-70%. Mid LAD at the takeoff of the second diagonal branch has severe calcified plague with associated stenosis > 70%. The severity of this lesion might be overestimated by significant calcification and blooming artifact. Distal LAD has minimal calcified plague with associated  stenosis 0-25%.  First diagonal branch is rather small and free of plague.  Second diagonal branch is moderate caliber vessel that has mild mixed ostial plague with associated stenosis 25-50%.  Third diagonal branch is very distal and small vessel without obvious plague.  Left circumflex artery is a moderate caliber non-dominant caliber vessel that gives rise to two large obtuse marginal branches. LCX has a mild calcified plague in the proximal segment with associated 25-50% stenosis.  Both obtuse marginal branches are poorly visualized secondary to patient's size but appears to only have minimal plague.  RCA is a very large dominant vessel that gives rise to PDA and PLVB.  Proximal RCA has mild non-calcified plague associated with 25-50 % stenosis. This lesion is associated with negative remodeling and therefore represent unstable lesion. There is another minimal calcified plague in the proximal RCA associated with 0-25% stenosis. There is minimal calcified plague in the mid RCA associated with 0-25% stenosis.  There is no obvious plague in the distal RCA, PDA or PLVB.  IMPRESSION: 1. Coronary calcium score of 144. This was 69 percentile for age and sex matched control.  2.  Normal origin or coronary arteries, right dominance.  3. One vessel coronary artery disease with significant lesions in the mid and possibly proximal LAD. Cardiac catheterization is recommended.  Tobias Alexander  Electronically Signed: By: Tobias Alexander On: 10/14/2013 21:59    Discharge Medications   Current Discharge Medication List    START taking these medications   Details  atorvastatin (LIPITOR) 80 MG tablet Take 1 tablet (80 mg total) by mouth every evening. Qty: 30 tablet, Refills: 6    furosemide (LASIX) 40 MG tablet Take 1 tablet (40 mg total) by mouth daily as needed (shortness of breath, fluid weight gain or fluid retention). Qty: 30 tablet, Refills: 1    isosorbide mononitrate (IMDUR) 30 MG 24 hr tablet Take 1  tablet (30 mg total) by mouth daily. Qty: 30 tablet, Refills: 6    metoprolol succinate (TOPROL-XL) 25 MG 24 hr tablet Take 1 tablet (25 mg total) by mouth daily. Qty: 30 tablet, Refills: 6    nitroGLYCERIN (NITROSTAT) 0.4 MG SL tablet Place 1 tablet (0.4 mg total) under the tongue every 5 (five) minutes as needed for chest pain (up to 3 doses). Qty: 25 tablet, Refills: 4      CONTINUE these medications which have NOT CHANGED   Details  aspirin 81 MG tablet Take 81 mg by mouth daily.    calcium-vitamin D (OSCAL WITH D) 500-200 MG-UNIT per tablet Take 1 tablet by mouth 2 (two) times daily.    Cholecalciferol (VITAMIN D) 2000 UNITS CAPS Take 1 capsule by mouth daily.     lisinopril-hydrochlorothiazide (PRINZIDE,ZESTORETIC) 20-25 MG per tablet Take 1 tablet by mouth daily.    Multiple Vitamins-Minerals (MULTIVITAMIN PO) Take 1 tablet by mouth daily.       STOP taking these medications     ibuprofen (ADVIL,MOTRIN) 200 MG tablet      nebivolol (BYSTOLIC) 5 MG tablet      simvastatin (ZOCOR) 20 MG tablet         Disposition   The patient will be discharged in stable condition to home. Discharge Orders   Future Appointments Provider Department Dept Phone   11/03/2013 8:00 AM Rosalio Macadamia, NP [facility] [place] [facility] [phone]   05/01/2014 9:30 AM Bennye Alm, MD [facility] [phone]   Future Orders Complete By Expires   Diet - low sodium heart healthy  As directed    Increase activity slowly  As directed    Scheduling Instructions:     No driving for 2 days. No lifting over 5 lbs for 1 week. No sexual activity for 1 week. Dr. Rennis Golden feels that you are OK to fly to [place] if you are feeling well - just be mindful of your lifting restrictions. Keep procedure site clean & dry. If you notice increased pain, swelling, bleeding or pus, call/return!  You may shower, but no soaking baths/hot tubs/pools for 1 week.   For patients with congestive  heart failure, we give them these special instructions:  1. Follow a low-salt diet and watch your fluid intake. In general, you should not be taking in more than 2 liters of fluid per day (no more than 8 glasses per day). Some patients are restricted to less than 1.5 liters of fluid per day (no more than 6 glasses per day). This includes sources of water in foods like soup, coffee, tea,  milk, etc. 2. Weigh yourself on the same scale at same time of day and keep a log. 3. Call your doctor: (Anytime you feel any of the following symptoms)  - 3-4 pound weight gain in 1-2 days or 2 pounds overnight  - Shortness of breath, with or without a dry hacking cough  - Swelling in the hands, feet or stomach  - If you have to sleep on extra pillows at night in order to breathe   IT IS IMPORTANT TO LET YOUR DOCTOR KNOW EARLY ON IF YOU ARE HAVING SYMPTOMS SO WE CAN HELP YOU!  Patients taking aspirin and/or those with coronary artery disease should generally stay away from medicines like ibuprofen, Advil, Motrin, naproxen, and Aleve. There is increased cardiovascular risk as well as risk of stomach bleeding. Only take these medicines sparingly. You may take Tylenol as directed or talk to your primary doctor about alternatives.     Follow-up Information   Follow up with Norma Fredrickson, NP. ([facility] - 11/03/13 at 8am)    Specialty:  Nurse Practitioner   Contact information:   [address] [place] [address] [phone]       Follow up with Hollice Espy, MD. (Follow up with your primary care doctor for your anemia, as well as the CT scan findings (mild lymph node enlargement that may be related to your congestive heart failure, but may require monitoring of symptoms).)    Specialty:  Family Medicine   Contact information:   [address] [place] [address] [phone]         Duration of Discharge Encounter: Greater than 30 minutes including physician  and PA time.  Signed, Ronie Spies PA-C 10/17/2013, 3:29 PM

## 2013-10-17 NOTE — Progress Notes (Signed)
Patient: Elio ForgetCatherine J Melland / Admit Date: 10/15/2013 / Date of Encounter: 10/17/2013, 8:30 AM  Subjective  Feeling well today. Exercised 9:41. I initially thought she was not going to reach target but she did, and exercised a minute beyond that. She had dyspnea but no CP. She says she felt better doing this test than she had when walking around her neighborhood (which had prompted her to see a doctor in the first place).  Objective   Telemetry: NSR/SB  2D Echo 10/16/13 - Left ventricle: The cavity size was normal. Wall thickness was increased in a pattern of mild LVH. Systolic function was normal. The estimated ejection fraction was in the range of 60% to 65%. Wall motion was normal; there were no regional wall motion abnormalities. Left ventricular diastolic function parameters were normal. - Aortic valve: There was very mild stenosis. Mild regurgitation. Valve area: 1.73cm^2(VTI). Valve area: 1.67cm^2 (Vmax).  Physical Exam: Blood pressure 111/54, pulse 63, temperature 98.1 F (36.7 C), temperature source Oral, resp. rate 18, last menstrual period 08/07/1998, SpO2 95.00%. General: Well developed, well nourished WF, in no acute distress. Head: Normocephalic, atraumatic, sclera non-icteric, no xanthomas, nares are without discharge. Neck:  JVP not elevated. Lungs: Clear bilaterally to auscultation without wheezes, rales, or rhonchi. Breathing is unlabored. Heart: RRR S1 S2 with soft SEM, no rubs or gallops.  Abdomen: Soft, non-tender, non-distended with normoactive bowel sounds. No rebound/guarding. Extremities: No clubbing or cyanosis. No edema. Distal pedal pulses are 2+ and equal bilaterally. Neuro: Alert and oriented X 3. Moves all extremities spontaneously. Psych:  Responds to questions appropriately with a normal affect.   Intake/Output Summary (Last 24 hours) at 10/17/13 0830 Last data filed at 10/17/13 0551  Gross per 24 hour  Intake    440 ml  Output   3125 ml  Net  -2685  ml    Inpatient Medications:  . aspirin EC  81 mg Oral Daily  . calcium-vitamin D  1 tablet Oral BID  . cholecalciferol  2,000 Units Oral Daily  . heparin  5,000 Units Subcutaneous 3 times per day  . lisinopril  20 mg Oral Daily   And  . hydrochlorothiazide  25 mg Oral Daily  . isosorbide mononitrate  30 mg Oral Daily  . metoprolol succinate  25 mg Oral Daily  . multivitamin with minerals  1 tablet Oral Daily  . simvastatin  20 mg Oral QHS  . sodium chloride  3 mL Intravenous Q12H   Infusions:    Labs:  Recent Labs  10/16/13 0408 10/17/13 0430  NA 138 135*  K 4.2 3.8  CL 99 92*  CO2 24 27  GLUCOSE 83 95  BUN 12 13  CREATININE 0.72 0.81  CALCIUM 9.3 9.7   No results found for this basename: AST, ALT, ALKPHOS, BILITOT, PROT, ALBUMIN,  in the last 72 hours  Recent Labs  10/15/13 1050 10/16/13 0408  WBC 5.0 5.0  NEUTROABS 2.8  --   HGB 11.3* 10.0*  HCT 33.1* 29.8*  MCV 89.2 89.2  PLT 268 244     Radiology/Studies:  Ct Heart Morp W/cta Cor W/score W/ca W/cm &/or Wo/cm  10/16/2013   ADDENDUM REPORT: 10/16/2013 18:18  ADDENDUM: OVER-READ INTERPRETATION  CT CHEST  The following report is an over-read performed by radiologist Dr. Llana AlimentEntrikin of [facility] on 10/16/2013. This over-read does not include interpretation of cardiac or coronary anatomy or pathology. The CTA interpretation by the cardiologist is attached.  FINDINGS: Diffuse interlobular septal thickening throughout  the lungs with patchy areas of ground-glass attenuation, favored to represent mild interstitial pulmonary edema. Small calcified granuloma in the left lower lobe. No other larger more suspicious appearing pulmonary nodules or masses are identified. Trace left and small right pleural effusions. Prominent lymphoid tissue in the hilar regions bilaterally, with the largest cluster of hilar lymph nodes on the right side measuring up to 1.8 x 1.9 cm. Cluster of subcarinal lymph nodes measuring 1.8 x  2.5 cm. Small hiatal hernia. Visualized portions of the upper abdomen are unremarkable. There are no aggressive appearing lytic or blastic lesions noted in the visualized portions of the skeleton.  IMPRESSION: 1. The appearance of the lungs suggests mild interstitial pulmonary edema. Given the presence of bilateral pleural effusions, findings suggest congestive heart failure. 2. Prominent lymphoid tissue in the hilar regions bilaterally is nonspecific. In the setting of potential congestive failure, this may be related to edema and lymphatic congestion, however, clinical correlation for signs and symptoms of the lymphoproliferative disorder or other malignancy is suggested.   Electronically Signed   By: Trudie Reed M.D.   On: 10/16/2013 18:18   10/16/2013   CLINICAL DATA:  Dyspnea on exertion, chest pain  EXAM: Cardiac/Coronary  CT  TECHNIQUE: The patient was scanned on a Philips 256 scanner.  FINDINGS: A 120 kV prospective scan was triggered in the descending thoracic aorta at 111 HU's. Axial non-contrast 3mm slices were carried out through the heart. The data set was analyzed on a dedicated work station and scored using the Agatson method. Gantry rotation speed was 270 msecs and collimation was .9 mm. No beta blockade and 0.4 mg of sl NTG was given. The 3D data set was reconstructed in 5% intervals of the 67-82 % of the R-R cycle. Diastolic phases were analyzed on a dedicated work station using MPR, MIP and VRT modes. The patient received 80 cc of contrast.  The quality of the study is affected by patient's size (BMI 37) and mild motion.  Aorta: Normal size of the aortic root, ascending aorta, aortic arch and descending aorta. Mild focal calcifications in the ascending aorta and aortic arch.  Aortic Valve:  Trileaflet, no calcifications.  Coronary Arteries:  Normal origin, right dominance.  Left main is a large vessel that gives rise to LAD and LCX artery.  Left main has minimal ostial non-calficied plague  associated with 0-25% stenosis.  LAD is a large caliber, long vessel that wraps around the apex and extends to at least half of the inferior interventricular groove. It gives rise to 3 diagonal branches. The ostial LAD has moderate mixed plague with associated stenosis 50-70%. Mid LAD at the takeoff of the second diagonal branch has severe calcified plague with associated stenosis > 70%. The severity of this lesion might be overestimated by significant calcification and blooming artifact. Distal LAD has minimal calcified plague with associated stenosis 0-25%.  First diagonal branch is rather small and free of plague.  Second diagonal branch is moderate caliber vessel that has mild mixed ostial plague with associated stenosis 25-50%.  Third diagonal branch is very distal and small vessel without obvious plague.  Left circumflex artery is a moderate caliber non-dominant caliber vessel that gives rise to two large obtuse marginal branches. LCX has a mild calcified plague in the proximal segment with associated 25-50% stenosis.  Both obtuse marginal branches are poorly visualized secondary to patient's size but appears to only have minimal plague.  RCA is a very large dominant vessel that gives rise  to PDA and PLVB.  Proximal RCA has mild non-calcified plague associated with 25-50 % stenosis. This lesion is associated with negative remodeling and therefore represent unstable lesion. There is another minimal calcified plague in the proximal RCA associated with 0-25% stenosis. There is minimal calcified plague in the mid RCA associated with 0-25% stenosis.  There is no obvious plague in the distal RCA, PDA or PLVB.  IMPRESSION: 1. Coronary calcium score of 144. This was 25 percentile for age and sex matched control.  2.  Normal origin or coronary arteries, right dominance.  3. One vessel coronary artery disease with significant lesions in the mid and possibly proximal LAD. Cardiac catheterization is recommended.  Tobias Alexander  Electronically Signed: By: Tobias Alexander On: 10/14/2013 21:59     Assessment and Plan  1. CAD/unstable angina with mod mild LAD stenosis with mid myocardial bridging 2. Acute diastolic CHF - EF 60% at cath 11/14/79 3. HTN 4. HL 5. Mild AS by echo 10/16/13 6. Normocytic anemia; pt denies bleeding 7. Ancillary cardiac CT findings: "Prominent lymphoid tissue in the hilar regions bilaterally is nonspecific. In the setting of potential congestive failure, this may be related to edema and lymphatic congestion, however, clinical correlation for signs and symptoms of the lymphoproliferative disorder or other malignancy is suggested."  Bystolic changed to metoprolol (did not get dose yesterday). Significant diuresis with the dose of IV Lasix. Continue ASA, BB. Dr. Rennis Golden considering increase in statin. Lymphoid tissue may be related to CHF but should f/u with PCP further regarding this finding - pt informed of this finding and denies any constitutional sx including weight loss, fever, night sweats. Await nuc results to evaluate functional significance of her LAD lesion. If nonischemic, consider daily oral Lasix.  Signed, Ronie Spies PA-C

## 2013-10-17 NOTE — Progress Notes (Signed)
Discharge instructions and heart failure booklet given.  No questions asked.  Verbalized understanding.  Left via wheelchair with husband. Kerry OaksPerkia, [name]

## 2013-10-17 NOTE — Progress Notes (Signed)
Pt. Seen and examined. Agree with the NP/PA-C note as written.  Excellent diuresis yesterday. BNP still elevated (but down ~50% from yesterday). Will give an additional lasix dose today. Await nuclear stress results - I will informally review. She did pretty well on the treadmill, breathing is improved. If negative for ischemia, could d/c later today. If abnormal and suggestive of anterior ischemia, will likely need PCI.  Chrystie NoseKenneth C. Ibrahim Mcpheeters, MD, [facility] Attending Cardiologist [facility]

## 2013-10-22 ENCOUNTER — Encounter: Payer: Self-pay | Admitting: Cardiology

## 2013-10-24 ENCOUNTER — Telehealth: Payer: Self-pay | Admitting: Cardiology

## 2013-10-24 NOTE — Telephone Encounter (Signed)
DOD, Dr. SwazilandJordan reviewed & okay for pcp to switch Lisinopril-HCTZ to Losartan-HCTZ.  Mylo Redebbie Clarece Drzewiecki RN

## 2013-10-24 NOTE — Telephone Encounter (Signed)
Pt calls today b/c she has had swelling of the lips on several occasions. She recently saw her pcp & thought it might be her lisinopril-HCTZ  States her pcp wanted clearance from Dr. Delton SeeNelson to switch the Lisinopril-HCTZ to Losartan-HCTZ. Pt recently in hospital for cp has follow-up with Norma FredricksonLori Gerhardt on 11/03/13 Will take to the DOD for review Mylo Redebbie Hillery Bhalla RN

## 2013-10-24 NOTE — Telephone Encounter (Signed)
New message     Talk to a nurse about switching medication

## 2013-11-03 ENCOUNTER — Ambulatory Visit (INDEPENDENT_AMBULATORY_CARE_PROVIDER_SITE_OTHER)
Admission: RE | Admit: 2013-11-03 | Discharge: 2013-11-03 | Disposition: A | Discharge status: Home / Self Care | Payer: BC Managed Care – PPO | Source: Ambulatory Visit | Attending: Nurse Practitioner | Admitting: Nurse Practitioner

## 2013-11-03 ENCOUNTER — Ambulatory Visit (INDEPENDENT_AMBULATORY_CARE_PROVIDER_SITE_OTHER): Payer: BC Managed Care – PPO | Admitting: Nurse Practitioner

## 2013-11-03 ENCOUNTER — Encounter: Payer: Self-pay | Admitting: Nurse Practitioner

## 2013-11-03 VITALS — BP 130/70 | HR 59 | Ht 62.0 in | Wt 191.4 lb

## 2013-11-03 DIAGNOSIS — R9389 Abnormal findings on diagnostic imaging of other specified body structures: Secondary | ICD-10-CM

## 2013-11-03 DIAGNOSIS — Z9889 Other specified postprocedural states: Secondary | ICD-10-CM

## 2013-11-03 DIAGNOSIS — R931 Abnormal findings on diagnostic imaging of heart and coronary circulation: Secondary | ICD-10-CM

## 2013-11-03 DIAGNOSIS — R079 Chest pain, unspecified: Secondary | ICD-10-CM

## 2013-11-03 DIAGNOSIS — I5032 Chronic diastolic (congestive) heart failure: Secondary | ICD-10-CM

## 2013-11-03 MED ORDER — IOHEXOL 300 MG/ML  SOLN
80.0000 mL | Freq: Once | INTRAMUSCULAR | Status: AC | PRN
  Administered 2013-11-03: 80 mL via INTRAVENOUS

## 2013-11-03 NOTE — Patient Instructions (Signed)
We are going to arrange for a chest and neck CT with contrast - try to do today  Stay on your current medicines  Restrict salt  Continue to weigh daily - use the Lasix if needed  See Dr. Delton SeeNelson in 6 to 8 weeks  Call the [facility] office at [phone] if you have any questions, problems or concerns.

## 2013-11-03 NOTE — Progress Notes (Signed)
Kerry Perez Date of Birth: 06-22-1951 Medical Record #[id number]  History of Present Illness: Kerry Perez is seen back today for a post hospital visit. Seen for Kerry Perez. She is a 63 year old female with a history of HTN, HLD, diet controlled DM, OA, GERD, positive family history for CAD who was recently evaluated in the office for chest pain and dyspnea. Cardiac Ct showed calcium score of 144 and one vessel CAD in the LAD.   She was then referred for cardiac cath - this showed a moderate LAD lesion with a nonischemic nuclear stress test. She will be managed medically.  Does have diastolic dysfunction - EF is normal at 60 to 65%. Her statin was increased. She was also noted to have mild AS by echo and normocytic anemia on her labs. The cardiac CT also showed some prominent lymphoid tissue - no constitutional symptoms - asked to follow up with PCP.  Comes back today. Here alone. Has been back to Perez Kerry Perez - has had labs drawn. Says Kerry Perez was not sure what to do about the finding on cardiac CT. No fever or chills. No cough. Overall feeling better than when she first presented. No problems with her cath site. No lasix taken since discharge. Breathing ok. Has had some tightness/achiness over the upper left chest - constant Saturday and Sunday - better today. No precipitating factors. Did not try anything to make go away. Very different from what she initially presented with.    Current Outpatient Prescriptions  Medication Sig Dispense Refill  . aspirin 81 MG tablet Take 81 mg by mouth daily.      Marland Kitchen atorvastatin (LIPITOR) 80 MG tablet Take 1 tablet (80 mg total) by mouth every evening.  30 tablet  6  . calcium-vitamin D (OSCAL WITH D) 500-200 MG-UNIT per tablet Take 1 tablet by mouth 2 (two) times daily.      . Cholecalciferol (VITAMIN D) 2000 UNITS CAPS Take 1 capsule by mouth daily.       . furosemide (LASIX) 40 MG tablet Take 1 tablet (40 mg total) by mouth daily as needed  (shortness of breath, fluid weight gain or fluid retention).  30 tablet  1  . isosorbide mononitrate (IMDUR) 30 MG 24 hr tablet Take 1 tablet (30 mg total) by mouth daily.  30 tablet  6  . losartan-hydrochlorothiazide (HYZAAR) 100-25 MG per tablet Take 1 tablet by mouth daily.       . metoprolol succinate (TOPROL-XL) 25 MG 24 hr tablet Take 1 tablet (25 mg total) by mouth daily.  30 tablet  6  . Multiple Vitamins-Minerals (MULTIVITAMIN PO) Take 1 tablet by mouth daily.       . nitroGLYCERIN (NITROSTAT) 0.4 MG SL tablet Place 1 tablet (0.4 mg total) under the tongue every 5 (five) minutes as needed for chest pain (up to 3 doses).  25 tablet  4   No current facility-administered medications for this visit.    Allergies  Allergen Reactions  . Lisinopril     Lips swelled    Past Medical History  Diagnosis Date  . Hypertension   . GERD (gastroesophageal reflux disease)     mild  . Hypercholesterolemia   . Diet-controlled diabetes mellitus   . Anemia   . Arthritis     "left knee"   . CAD (coronary artery disease)     a. Moderate LAD lesion by cath 10/15/13 with nonischemic nuclear stress test 10/17/13  . Diastolic  CHF     a. Dx 10/2013 - normal EF but elevated LVEDP by cath.  . Aortic stenosis     a. Mild by echo 10/2013.  Marland Kitchen. Abnormal cardiac CT angiography     a. Ancillary findings: prominent hilar lymphoid tissue nonspecific - possibly related to CHF and lymphatic congestion, clinical correlation for signs/sx of ymphoproliferative disorder or other malignancy is suggested - denied constitutional sx and will f/u with PCP.    Past Surgical History  Procedure Laterality Date  . Stomach surgery  1965    Hairball removed from stomach  . Knee arthroscopy Left 06/18/2013    Procedure: LEFT KNEE ARTHROSCOPY WITH DEBRIDEMENT;  Surgeon: Kerry DrillingFrank V Aluisio, MD;  Location: [facility];  Service: Orthopedics;  Laterality: Left;  . Appendectomy  1965  . Breast biopsy Right ~ 2000; ~ 2002"    "both  benign"    History  Smoking status  . Never Smoker   Smokeless tobacco  . Never Used    History  Alcohol Use  . 1.2 oz/week  . 2 Glasses of wine per week    Family History  Problem Relation Age of Onset  . Hypertension Mother   . Heart disease Mother     Review of Systems: The review of systems is per the HPI.  All other systems were reviewed and are negative.  Physical Exam: BP 130/70  Pulse 59  Ht 5\' 2"  (1.575 m)  Wt 191 lb 6.4 oz (86.818 kg)  BMI 35.00 kg/m2  LMP 08/07/1998 Patient is very pleasant and in no acute distress. [name] Skin is warm and dry. Color is normal.  HEENT is unremarkable. Normocephalic/atraumatic. PERRL. Sclera are nonicteric. Neck is supple. No masses. No JVD. Lungs are clear. Cardiac exam shows a regular rate and rhythm. She has a harsh outflow murmur noted.  Abdomen is soft. Extremities are without edema. Gait and ROM are intact. No gross neurologic deficits noted.  Wt Readings from Last 3 Encounters:  11/03/13 191 lb 6.4 oz (86.818 kg)  10/17/13 194 lb 6.4 oz (88.179 kg)  10/17/13 194 lb 6.4 oz (88.179 kg)    LABORATORY DATA: EKG with sinus brady - unchanged. Reviewed with Dr. Delton SeeNelson  Lab Results  Component Value Date   WBC 5.0 10/16/2013   HGB 10.0* 10/16/2013   HCT 29.8* 10/16/2013   PLT 244 10/16/2013   GLUCOSE 95 10/17/2013   CHOL 138 10/17/2013   TRIG 98 10/17/2013   HDL 60 10/17/2013   LDLCALC 58 10/17/2013   ALT 36* 10/17/2013   AST 21 10/17/2013   NA 135* 10/17/2013   K 3.8 10/17/2013   CL 92* 10/17/2013   CREATININE 0.81 10/17/2013   BUN 13 10/17/2013   CO2 27 10/17/2013   INR 0.96 10/15/2013   Echo Study Conclusions  - Left ventricle: The cavity size was normal. Wall thickness was increased in a pattern of mild LVH. Systolic function was normal. The estimated ejection fraction was in the range of 60% to 65%. Wall motion was normal; there were no regional wall motion abnormalities. Left  ventricular diastolic function parameters were normal. - Aortic valve: There was very mild stenosis. Mild regurgitation. Valve area: 1.73cm^2(VTI). Valve area: 1.67cm^2 (Vmax). - Right atrium: The atrium was mildly dilated.  Coronary angiography:  Coronary dominance: right  Left Main: normal  Left Anterior Descending (LAD): Normal in size and mildly calcified. There is a 50-60% discrete stenosis after the origin of second diagonal. The rest of  the midsegment has minor irregularities. There is mild myocardial bridging noted in the midsegment.  1st diagonal (D1): Normal in size with 30% ostial stenosis.  2nd diagonal (D2): Normal in size with 20% ostial stenosis.  3rd diagonal (D3): Small in size with minor irregularities.  Circumflex (LCx): Normal in size and nondominant. The vessel has minor irregularities.  1st obtuse marginal: Very small in size.  2nd obtuse marginal: Normal in size with no significant disease.  3rd obtuse marginal: Normal in size with minor irregularities.  Right Coronary Artery: normal in size and dominant. The stent percent proximal stenosis and 20% mid stenosis.  Posterior descending artery: normal in size with no significant disease.  Posterior AV segment: normal in size with no significant disease.  Posterolateral branchs: No significant disease. Left ventriculography: Left ventricular systolic function is normal , LVEF is estimated at 60 %, there is no significant mitral regurgitation  Final Conclusions:  1. Moderate mid LAD stenosis with mild mid myocardial bridging.  2.Normal LV systolic function with mildly elevated left ventricular end-diastolic pressure.  Recommendations:  I requested an echocardiogram. Recommend initial medical therapy. If the patient fails medical therapy, consider functional testing or pressure wire interrogation of the LAD  Lorine Bears MD, [facility]  10/15/2013, 7:47 PM   CARDIAC CT IMPRESSION: 1. The appearance of the lungs suggests  mild interstitial pulmonary edema. Given the presence of bilateral pleural effusions, findings suggest congestive heart failure. 2. Prominent lymphoid tissue in the hilar regions bilaterally is nonspecific. In the setting of potential congestive failure, this may be related to edema and lymphatic congestion, however, clinical correlation for signs and symptoms of the lymphoproliferative disorder or other malignancy is suggested.   Electronically Signed By: Trudie Reed M.D. On: 10/16/2013 18:18  IMPRESSION: 1. Coronary calcium score of 144. This was 20 percentile for age and sex matched control.  2. Normal origin or coronary arteries, right dominance.  3. One vessel coronary artery disease with significant lesions in the mid and possibly proximal LAD. Cardiac catheterization is recommended.  Tobias Alexander   Assessment / Plan:  1. S/P cardiac cath - to manage medically - her initial symptoms of chest pain and dyspnea seem to be resolved. Now with left upper chest pain - discussed with Kerry Perez here this am - she would like to proceed with CT of the chest and neck with contrast. Kerry Perez is concerned for a malignant process given the findings on cardiac CT. Further disposition to follow.  2. Diastolic HF - looks compensated. Continue with current regimen.   3. Normocytic anemia - has seen her PCP with repeat labs  4. Abnormal cardiac CT - lymphoid tissue - will proceed on with chest and neck CT with contrast to further define.  5. Mild AS - will need following.  Further disposition to follow. Perez Kerry Perez back in 6 to 8 weeks with fasting labs on return.   Patient is agreeable to this plan and will call if any problems develop in the interim.   Rosalio Macadamia, RN, ANP-C [facility] [address] [place], [place]  [address] [phone]

## 2013-12-08 ENCOUNTER — Ambulatory Visit: Payer: BC Managed Care – PPO | Admitting: Cardiology

## 2013-12-17 ENCOUNTER — Encounter: Payer: Self-pay | Admitting: Cardiology

## 2013-12-17 ENCOUNTER — Ambulatory Visit (INDEPENDENT_AMBULATORY_CARE_PROVIDER_SITE_OTHER): Payer: BC Managed Care – PPO | Admitting: Cardiology

## 2013-12-17 VITALS — BP 128/60 | HR 64 | Ht 62.0 in | Wt 184.0 lb

## 2013-12-17 DIAGNOSIS — I2510 Atherosclerotic heart disease of native coronary artery without angina pectoris: Secondary | ICD-10-CM

## 2013-12-17 DIAGNOSIS — I10 Essential (primary) hypertension: Secondary | ICD-10-CM

## 2013-12-17 DIAGNOSIS — I5031 Acute diastolic (congestive) heart failure: Secondary | ICD-10-CM

## 2013-12-17 DIAGNOSIS — I200 Unstable angina: Secondary | ICD-10-CM

## 2013-12-17 DIAGNOSIS — R079 Chest pain, unspecified: Secondary | ICD-10-CM

## 2013-12-17 LAB — COMPREHENSIVE METABOLIC PANEL
ALT: 23 U/L (ref 0–35)
AST: 23 U/L (ref 0–37)
Albumin: 3.9 g/dL (ref 3.5–5.2)
Alkaline Phosphatase: 93 U/L (ref 39–117)
BUN: 15 mg/dL (ref 6–23)
CO2: 30 mEq/L (ref 19–32)
Calcium: 9.3 mg/dL (ref 8.4–10.5)
Chloride: 99 mEq/L (ref 96–112)
Creatinine, Ser: 0.8 mg/dL (ref 0.4–1.2)
GFR: 74.7 mL/min (ref >60.00)
Glucose, Bld: 109 mg/dL — ABNORMAL HIGH (ref 70–99)
Potassium: 3.6 mEq/L (ref 3.5–5.1)
Sodium: 136 mEq/L (ref 135–145)
Total Bilirubin: 0.8 mg/dL (ref 0.2–1.2)
Total Protein: 7.4 g/dL (ref 6.0–8.3)

## 2013-12-17 NOTE — Progress Notes (Signed)
Patient ID: Kerry Perez, female   DOB: 1951/05/12, 63 y.o.   MRN: [id number]    History of Present Illness: Kerry Perez is seen back today for a post hospital visit. Seen for Dr. Delton See. She is a 63 year old female with a history of HTN, HLD, diet controlled DM, OA, GERD, positive family history for CAD who was recently evaluated in the office for chest pain and dyspnea. Cardiac Ct showed calcium score of 144 and one vessel CAD in the LAD.   She was then referred for cardiac cath - this showed a moderate LAD lesion with a nonischemic nuclear stress test. She will be managed medically.  Does have diastolic dysfunction - EF is normal at 60 to 65%. Her statin was increased. She was also noted to have mild AS by echo and normocytic anemia on her labs. The cardiac CT also showed some prominent lymphoid tissue - no constitutional symptoms - asked to follow up with PCP.  Comes back today. Says Dr. Kevan Ny - her PCP was not sure what to do about the finding on cardiac CT. No fever or chills. No cough. Overall feeling better than when she first presented. She has lost at least 10 pounds but she attributes of having edema. No problems with her cath site. No lasix taken since discharge. Breathing ok. She denies any chest pain in the last couples of weeks. She has started to walk on daily basis and feels okay about it.    Allergies  Allergen Reactions  . Lisinopril     Lips swelled   Past Medical History  Diagnosis Date  . Hypertension   . GERD (gastroesophageal reflux disease)     mild  . Hypercholesterolemia   . Diet-controlled diabetes mellitus   . Anemia   . Arthritis     "left knee"   . CAD (coronary artery disease)     a. Moderate LAD lesion by cath 10/15/13 with nonischemic nuclear stress test 10/17/13  . Diastolic CHF     a. Dx 10/2013 - normal EF but elevated LVEDP by cath.  . Aortic stenosis     a. Mild by echo 10/2013.  Marland Kitchen Abnormal cardiac CT angiography     a. Ancillary findings:  prominent hilar lymphoid tissue nonspecific - possibly related to CHF and lymphatic congestion, clinical correlation for signs/sx of ymphoproliferative disorder or other malignancy is suggested - denied constitutional sx and will f/u with PCP.    Past Surgical History  Procedure Laterality Date  . Stomach surgery  1965    Hairball removed from stomach  . Knee arthroscopy Left 06/18/2013    Procedure: LEFT KNEE ARTHROSCOPY WITH DEBRIDEMENT;  Surgeon: Loanne Drilling, MD;  Location: [facility];  Service: Orthopedics;  Laterality: Left;  . Appendectomy  1965  . Breast biopsy Right ~ 2000; ~ 2002"    "both benign"    History  Smoking status  . Never Smoker   Smokeless tobacco  . Never Used    History  Alcohol Use  . 1.2 oz/week  . 2 Glasses of wine per week    Family History  Problem Relation Age of Onset  . Hypertension Mother   . Heart disease Mother     Review of Systems: The review of systems is per the HPI.  All other systems were reviewed and are negative.  Physical Exam: LMP 08/07/1998 Patient is very pleasant and in no acute distress. Cisco accent. She is obese. Skin is warm and dry. Color  is normal.  HEENT is unremarkable. Normocephalic/atraumatic. PERRL. Sclera are nonicteric. Neck is supple. No masses. No JVD. Lungs are clear. Cardiac exam shows a regular rate and rhythm. She has a harsh outflow murmur noted.  Abdomen is soft. Extremities are without edema. Gait and ROM are intact. No gross neurologic deficits noted.  Wt Readings from Last 3 Encounters:  11/03/13 191 lb 6.4 oz (86.818 kg)  10/17/13 194 lb 6.4 oz (88.179 kg)  10/17/13 194 lb 6.4 oz (88.179 kg)    LABORATORY DATA: EKG with sinus brady - unchanged. Reviewed with Dr. Delton SeeNelson  Lab Results  Component Value Date   WBC 5.0 10/16/2013   HGB 10.0* 10/16/2013   HCT 29.8* 10/16/2013   PLT 244 10/16/2013   GLUCOSE 95 10/17/2013   CHOL 138 10/17/2013   TRIG 98 10/17/2013   HDL 60 10/17/2013   LDLCALC 58  10/17/2013   ALT 36* 10/17/2013   AST 21 10/17/2013   NA 135* 10/17/2013   K 3.8 10/17/2013   CL 92* 10/17/2013   CREATININE 0.81 10/17/2013   BUN 13 10/17/2013   CO2 27 10/17/2013   INR 0.96 10/15/2013   Echo Study Conclusions  - Left ventricle: The cavity size was normal. Wall thickness was increased in a pattern of mild LVH. Systolic function was normal. The estimated ejection fraction was in the range of 60% to 65%. Wall motion was normal; there were no regional wall motion abnormalities. Left ventricular diastolic function parameters were normal. - Aortic valve: There was very mild stenosis. Mild regurgitation. Valve area: 1.73cm^2(VTI). Valve area: 1.67cm^2 (Vmax). - Right atrium: The atrium was mildly dilated.  Coronary angiography:  Coronary dominance: right  Left Main: normal  Left Anterior Descending (LAD): Normal in size and mildly calcified. There is a 50-60% discrete stenosis after the origin of second diagonal. The rest of the midsegment has minor irregularities. There is mild myocardial bridging noted in the midsegment.  1st diagonal (D1): Normal in size with 30% ostial stenosis.  2nd diagonal (D2): Normal in size with 20% ostial stenosis.  3rd diagonal (D3): Small in size with minor irregularities.  Circumflex (LCx): Normal in size and nondominant. The vessel has minor irregularities.  1st obtuse marginal: Very small in size.  2nd obtuse marginal: Normal in size with no significant disease.  3rd obtuse marginal: Normal in size with minor irregularities.  Right Coronary Artery: normal in size and dominant. The stent percent proximal stenosis and 20% mid stenosis.  Posterior descending artery: normal in size with no significant disease.  Posterior AV segment: normal in size with no significant disease.  Posterolateral branchs: No significant disease. Left ventriculography: Left ventricular systolic function is normal , LVEF is estimated at 60 %, there is no significant  mitral regurgitation  Final Conclusions:  1. Moderate mid LAD stenosis with mild mid myocardial bridging.  2.Normal LV systolic function with mildly elevated left ventricular end-diastolic pressure.  Recommendations:  I requested an echocardiogram. Recommend initial medical therapy. If the patient fails medical therapy, consider functional testing or pressure wire interrogation of the LAD  Lorine BearsMuhammad Arida MD, [facility]  10/15/2013, 7:47 PM   CARDIAC CT IMPRESSION: 1. The appearance of the lungs suggests mild interstitial pulmonary edema. Given the presence of bilateral pleural effusions, findings suggest congestive heart failure. 2. Prominent lymphoid tissue in the hilar regions bilaterally is nonspecific. In the setting of potential congestive failure, this may be related to edema and lymphatic congestion, however, clinical correlation for signs and symptoms of the  lymphoproliferative disorder or other malignancy is suggested.   Electronically Signed By: Trudie Reedaniel Entrikin M.D. On: 10/16/2013 18:18  IMPRESSION: 1. Coronary calcium score of 144. This was 6396 percentile for age and sex matched control.  2. Normal origin or coronary arteries, right dominance.  3. One vessel coronary artery disease with significant lesions in the mid and possibly proximal LAD. Cardiac catheterization is recommended.  Tobias AlexanderKatarina Dalisa Forrer   Assessment / Plan:  1. S/P cardiac cath - moderate LAD disease, to manage medically - her initial symptoms of chest pain and dyspnea seem to be resolved. Stress test negative for ischemia, possible I was in the future if her symptoms persist.  Dr. Delton SeeNelson is concerned for a malignant process given the findings on cardiac CT. Further disposition to follow.  2. Chronic diastolic HF - looks compensated. Continue with current regimen.   3. Normocytic anemia - has seen her PCP with repeat labs  4. Abnormal cardiac CT - lymphoid tissue or coronary CT however a followup CT  chest showed only 2 mm lung nodule. No lymphadenopathy inhaler or mediastinal region. No mass in the neck area. Because the patient has never been a smoker we will repeat a chest CT in 1 year.  5. Mild AS - will need following.  Followup in 4 months.   Lars MassonKatarina H Shaunte Weissinger 12/17/2013

## 2013-12-17 NOTE — Patient Instructions (Signed)
Your physician recommends that you schedule a follow-up appointment in: [place] Delton SeeNELSON   Your physician recommends that you continue on your current medications as directed. Please refer to the Current Medication list given to you today.  Your physician recommends that you return for lab work in: TODAY (CMET)

## 2014-01-16 ENCOUNTER — Other Ambulatory Visit: Payer: Self-pay | Admitting: *Deleted

## 2014-01-16 MED ORDER — ISOSORBIDE MONONITRATE ER 30 MG PO TB24: 30.0000 mg | ORAL_TABLET | Freq: Every day | ORAL | Status: DC

## 2014-01-16 MED ORDER — METOPROLOL SUCCINATE ER 25 MG PO TB24: 25.0000 mg | ORAL_TABLET | Freq: Every day | ORAL | Status: DC

## 2014-03-17 ENCOUNTER — Encounter: Payer: Self-pay | Admitting: Cardiology

## 2014-03-17 ENCOUNTER — Ambulatory Visit (INDEPENDENT_AMBULATORY_CARE_PROVIDER_SITE_OTHER): Payer: BC Managed Care – PPO | Admitting: Cardiology

## 2014-03-17 VITALS — BP 108/58 | HR 64 | Ht 62.0 in | Wt 175.6 lb

## 2014-03-17 DIAGNOSIS — I951 Orthostatic hypotension: Secondary | ICD-10-CM

## 2014-03-17 NOTE — Patient Instructions (Signed)
Your physician has recommended you make the following change in your medication:   STOP TAKING YOUR IMDUR (ISOSORBIDE TARTRATE NOW)  Your physician recommends that you schedule a follow-up appointment in: 4 MONTHS WITH DR Delton SeeNELSON

## 2014-03-17 NOTE — Progress Notes (Signed)
Patient ID: Kerry Perez, female   DOB: 02-Mar-1951, 63 y.o.   MRN: [id number]    History of Present Illness: Kerry Perez is seen back today for a post hospital visit. Seen for Dr. Delton See. She is a 63 year old female with a history of HTN, HLD, diet controlled DM, OA, GERD, positive family history for CAD who was recently evaluated in the office for chest pain and dyspnea. Cardiac Ct showed calcium score of 144 and one vessel CAD in the LAD.   She was then referred for cardiac cath - this showed a moderate LAD lesion with a nonischemic nuclear stress test. She will be managed medically.  Does have diastolic dysfunction - EF is normal at 60 to 65%. Her statin was increased. She was also noted to have mild AS by echo and normocytic anemia on her labs. The cardiac CT also showed some prominent lymphoid tissue - no constitutional symptoms - asked to follow up with PCP.  Comes back today. Says Dr. Kevan Ny - her PCP was not sure what to do about the finding on cardiac CT. No fever or chills. No cough. Overall feeling better than when she first presented. She has lost at least 10 pounds but she attributes of having edema. No problems with her cath site. No lasix taken since discharge. Breathing ok. She denies any chest pain in the last couples of weeks. She has started to walk on daily basis and feels okay about it.  03/17/2014 - the patient reports occasional lower retrosternal chest tightness that is not related to exertion. It is a very mild pressure. She also complains of orthostatic hypotension. She is planning to travel to [place]. No LE edema, DOE, orthopnea.     Allergies  Allergen Reactions  . Lisinopril Other (See Comments)    Lips swelled   Past Medical History  Diagnosis Date  . Hypertension   . GERD (gastroesophageal reflux disease)     mild  . Hypercholesterolemia   . Diet-controlled diabetes mellitus   . Anemia   . Arthritis     "left knee"   . CAD (coronary artery disease)       a. Moderate LAD lesion by cath 10/15/13 with nonischemic nuclear stress test 10/17/13  . Diastolic CHF     a. Dx 10/2013 - normal EF but elevated LVEDP by cath.  . Aortic stenosis     a. Mild by echo 10/2013.  Marland Kitchen Abnormal cardiac CT angiography     a. Ancillary findings: prominent hilar lymphoid tissue nonspecific - possibly related to CHF and lymphatic congestion, clinical correlation for signs/sx of ymphoproliferative disorder or other malignancy is suggested - denied constitutional sx and will f/u with PCP.    Past Surgical History  Procedure Laterality Date  . Stomach surgery  1965    Hairball removed from stomach  . Knee arthroscopy Left 06/18/2013    Procedure: LEFT KNEE ARTHROSCOPY WITH DEBRIDEMENT;  Surgeon: Loanne Drilling, MD;  Location: [facility];  Service: Orthopedics;  Laterality: Left;  . Appendectomy  1965  . Breast biopsy Right ~ 2000; ~ 2002"    "both benign"    History  Smoking status  . Never Smoker   Smokeless tobacco  . Never Used    History  Alcohol Use  . 1.2 oz/week  . 2 Glasses of wine per week    Family History  Problem Relation Age of Onset  . Hypertension Mother   . Heart disease Mother  Review of Systems: The review of systems is per the HPI.  All other systems were reviewed and are negative.  Physical Exam: BP 108/58  Pulse 64  Ht 5\' 2"  (1.575 m)  Wt 175 lb 9.6 oz (79.652 kg)  BMI 32.11 kg/m2  LMP 08/07/1998 Patient is very pleasant and in no acute distress. [place] accent. She is obese. Skin is warm and dry. Color is normal.  HEENT is unremarkable. Normocephalic/atraumatic. PERRL. Sclera are nonicteric. Neck is supple. No masses. No JVD. Lungs are clear. Cardiac exam shows a regular rate and rhythm. She has a harsh outflow murmur noted.  Abdomen is soft. Extremities are without edema. Gait and ROM are intact. No gross neurologic deficits noted.  Wt Readings from Last 3 Encounters:  03/17/14 175 lb 9.6 oz (79.652 kg)  12/17/13 184 lb  (83.462 kg)  11/03/13 191 lb 6.4 oz (86.818 kg)    LABORATORY DATA: EKG with sinus brady - unchanged. Reviewed with Dr. Delton See  Lab Results  Component Value Date   WBC 5.0 10/16/2013   HGB 10.0* 10/16/2013   HCT 29.8* 10/16/2013   PLT 244 10/16/2013   GLUCOSE 109* 12/17/2013   CHOL 138 10/17/2013   TRIG 98 10/17/2013   HDL 60 10/17/2013   LDLCALC 58 10/17/2013   ALT 23 12/17/2013   AST 23 12/17/2013   NA 136 12/17/2013   K 3.6 12/17/2013   CL 99 12/17/2013   CREATININE 0.8 12/17/2013   BUN 15 12/17/2013   CO2 30 12/17/2013   INR 0.96 10/15/2013   Echo Study Conclusions  - Left ventricle: The cavity size was normal. Wall thickness was increased in a pattern of mild LVH. Systolic function was normal. The estimated ejection fraction was in the range of 60% to 65%. Wall motion was normal; there were no regional wall motion abnormalities. Left ventricular diastolic function parameters were normal. - Aortic valve: There was very mild stenosis. Mild regurgitation. Valve area: 1.73cm^2(VTI). Valve area: 1.67cm^2 (Vmax). - Right atrium: The atrium was mildly dilated.  Coronary angiography:  Coronary dominance: right  Left Main: normal  Left Anterior Descending (LAD): Normal in size and mildly calcified. There is a 50-60% discrete stenosis after the origin of second diagonal. The rest of the midsegment has minor irregularities. There is mild myocardial bridging noted in the midsegment.  1st diagonal (D1): Normal in size with 30% ostial stenosis.  2nd diagonal (D2): Normal in size with 20% ostial stenosis.  3rd diagonal (D3): Small in size with minor irregularities.  Circumflex (LCx): Normal in size and nondominant. The vessel has minor irregularities.  1st obtuse marginal: Very small in size.  2nd obtuse marginal: Normal in size with no significant disease.  3rd obtuse marginal: Normal in size with minor irregularities.  Right Coronary Artery: normal in size and dominant. The stent percent  proximal stenosis and 20% mid stenosis.  Posterior descending artery: normal in size with no significant disease.  Posterior AV segment: normal in size with no significant disease.  Posterolateral branchs: No significant disease. Left ventriculography: Left ventricular systolic function is normal , LVEF is estimated at 60 %, there is no significant mitral regurgitation  Final Conclusions:  1. Moderate mid LAD stenosis with mild mid myocardial bridging.  2.Normal LV systolic function with mildly elevated left ventricular end-diastolic pressure.  Recommendations:  I requested an echocardiogram. Recommend initial medical therapy. If the patient fails medical therapy, consider functional testing or pressure wire interrogation of the LAD  Lorine Bears MD, [facility]  10/15/2013, 7:47 PM  Chest CT 10/2013 IMPRESSION:  No evidence for mediastinal or hilar lymphadenopathy.  Probable bilateral tiny calcified granulomata. As the 2 mm nodule in  the left lower lobe cannot be definitely seen to be calcified,  followup by consensus criteria is suggested. If the patient is at  high risk for bronchogenic carcinoma, follow-up chest CT at 1 year  is recommended. If the patient is at low risk, no follow-up is  needed. This recommendation follows the consensus statement:  Guidelines for Management of Small Pulmonary Nodules Detected on CT  Scans: A Statement from the Fleischner Society as published in  Radiology 2005; 237:395-400.  Electronically Signed  By: Kennith CenterEric Mansell M.D.  On: 11/03/2013 12:06  CARDIAC CT IMPRESSION: 1. The appearance of the lungs suggests mild interstitial pulmonary edema. Given the presence of bilateral pleural effusions, findings suggest congestive heart failure. 2. Prominent lymphoid tissue in the hilar regions bilaterally is nonspecific. In the setting of potential congestive failure, this may be related to edema and lymphatic congestion, however, clinical correlation for signs  and symptoms of the lymphoproliferative disorder or other malignancy is suggested.  Electronically Signed By: Trudie Reedaniel Entrikin M.D. On: 10/16/2013 18:18  IMPRESSION: 1. Coronary calcium score of 144. This was 4096 percentile for age and sex matched control.  2. Normal origin or coronary arteries, right dominance.  3. One vessel coronary artery disease with significant lesions in the mid and possibly proximal LAD. Cardiac catheterization is recommended.  Tobias AlexanderKatarina Camaryn Lumbert   Assessment / Plan:  1. S/P cardiac cath - moderate LAD disease, to manage medically - her initial symptoms of chest pain and dyspnea seem to be resolved. Stress test negative for ischemia, her current symptoms are very mild, for now we'll continue medical therapy and consider intervention if her symptoms persist or get worse.  2. Chronic diastolic HF - compensated. Continue with current regimen.   3. Normocytic anemia - has seen her PCP with repeat labs  4. Abnormal cardiac CT - lymphoid tissue or coronary CT however a followup CT chest showed only 2 mm lung nodule. No lymphadenopathy inhaler or mediastinal region. No mass in the neck area. Because the patient has never been a smoker we will repeat a chest CT in 1 year (March 2016).  5. Mild AS - will need following.  6. Orthostatic hypotension - we will discontinue Imdur and follow for symptoms.  Followup in 4 months.   Lars MassonELSON, Yazmeen Woolf H 03/17/2014

## 2014-05-01 ENCOUNTER — Ambulatory Visit: Payer: BC Managed Care – PPO | Admitting: Gynecology

## 2014-05-12 ENCOUNTER — Ambulatory Visit (INDEPENDENT_AMBULATORY_CARE_PROVIDER_SITE_OTHER): Payer: BC Managed Care – PPO | Admitting: Gynecology

## 2014-05-12 ENCOUNTER — Encounter: Payer: Self-pay | Admitting: Gynecology

## 2014-05-12 VITALS — BP 110/70 | HR 74 | Resp 18 | Ht 62.0 in | Wt 172.0 lb

## 2014-05-12 DIAGNOSIS — Z01419 Encounter for gynecological examination (general) (routine) without abnormal findings: Secondary | ICD-10-CM

## 2014-05-12 DIAGNOSIS — Z0000 Encounter for general adult medical examination without abnormal findings: Secondary | ICD-10-CM

## 2014-05-12 LAB — POCT URINALYSIS DIPSTICK
Leukocytes, UA: NEGATIVE
PH UA: 5
RBC UA: 2
Urobilinogen, UA: NEGATIVE

## 2014-05-12 NOTE — Progress Notes (Signed)
63 y.o. Married Caucasian female   [phone] here for annual exam. Pt reports menses are absent due to Menopause. She does not report hot flashes, does not have night sweats, does have vaginal dryness.  She is using lubricants, Olive Oil.  She does not report post-menopasual bleeding. Pt recently dx with CAD with doing well with Lasix, sleep not interrupted.   Unsure if she has had a DEXA-will confirm and follow up with Dr Kevan Ny  Patient's last menstrual period was 08/07/1998.          Sexually active: Yes.    The current method of family planning is post menopausal status.    Exercising: Yes.    Walking, clogging Last pap:  04/28/13 NEG HR HPV not indicated  Abnormal PAP: no Mammogram: 07/09/13 Bi-Rads 1 BSE: occasionally  Colonoscopy: still current -Normal- not due for 3-4 years per pt. DEXA: never had one? Alcohol: 3-4 glasses of wine Tobacco: no  Labs: Shaune Pollack, MD ; Urine: Blood 2  Health Maintenance  Topic Date Due  . Colonoscopy  05/28/2000  . Zostavax  05/28/2010  . Influenza Vaccine  03/07/2014  . Mammogram  05/28/2014  . Pap Smear  04/28/2016  . Tetanus/tdap  08/07/2017    Family History  Problem Relation Age of Onset  . Hypertension Mother   . Heart disease Mother     Patient Active Problem List   Diagnosis Date Noted  . Orthostatic hypotension 03/17/2014  . Acute diastolic heart failure, NYHA class 2 10/16/2013  . CAD (coronary artery disease) 10/16/2013  . Unstable angina 10/16/2013  . Chest pain 10/15/2013  . Chest pain on exertion 10/14/2013  . DOE (dyspnea on exertion) 10/14/2013  . Acute medial meniscal tear 06/17/2013  . Essential hypertension, benign 04/28/2013  . Pure hypercholesterolemia 04/28/2013    Past Medical History  Diagnosis Date  . Hypertension   . GERD (gastroesophageal reflux disease)     mild  . Hypercholesterolemia   . Diet-controlled diabetes mellitus   . Anemia   . Arthritis     "left knee"   . CAD (coronary artery  disease)     a. Moderate LAD lesion by cath 10/15/13 with nonischemic nuclear stress test 10/17/13  . Diastolic CHF     a. Dx 10/2013 - normal EF but elevated LVEDP by cath.  . Aortic stenosis     a. Mild by echo 10/2013.  Marland Kitchen Abnormal cardiac CT angiography     a. Ancillary findings: prominent hilar lymphoid tissue nonspecific - possibly related to CHF and lymphatic congestion, clinical correlation for signs/sx of ymphoproliferative disorder or other malignancy is suggested - denied constitutional sx and will f/u with PCP.    Past Surgical History  Procedure Laterality Date  . Stomach surgery  1965    Hairball removed from stomach  . Knee arthroscopy Left 06/18/2013    Procedure: LEFT KNEE ARTHROSCOPY WITH DEBRIDEMENT;  Surgeon: Loanne Drilling, MD;  Location: [facility];  Service: Orthopedics;  Laterality: Left;  . Appendectomy  1965  . Breast biopsy Right ~ 2000; ~ 2002"    "both benign"    Allergies: Lisinopril  Current Outpatient Prescriptions  Medication Sig Dispense Refill  . acetaminophen (TYLENOL) 325 MG tablet Take 650 mg by mouth as needed.      Marland Kitchen aspirin 81 MG tablet Take 81 mg by mouth daily.      Marland Kitchen atorvastatin (LIPITOR) 80 MG tablet Take 1 tablet (80 mg total) by mouth every evening.  30 tablet  6  . calcium-vitamin D (OSCAL WITH D) 500-200 MG-UNIT per tablet Take 1 tablet by mouth 2 (two) times daily.      . Cholecalciferol (VITAMIN D) 2000 UNITS CAPS Take 1 capsule by mouth daily.       . furosemide (LASIX) 40 MG tablet Take 1 tablet (40 mg total) by mouth daily as needed (shortness of breath, fluid weight gain or fluid retention).  30 tablet  1  . losartan-hydrochlorothiazide (HYZAAR) 100-25 MG per tablet Take 1 tablet by mouth daily.       . metoprolol succinate (TOPROL-XL) 25 MG 24 hr tablet Take 1 tablet (25 mg total) by mouth daily.  90 tablet  0  . Multiple Vitamins-Minerals (MULTIVITAMIN PO) Take 1 tablet by mouth daily.       . nitroGLYCERIN (NITROSTAT) 0.4 MG SL  tablet Place 1 tablet (0.4 mg total) under the tongue every 5 (five) minutes as needed for chest pain (up to 3 doses).  25 tablet  4  . isosorbide mononitrate (IMDUR) 30 MG 24 hr tablet        No current facility-administered medications for this visit.    ROS: Pertinent items are noted in HPI.  Exam:    LMP 08/07/1998 Weight change: @WEIGHTCHANGE @ Last 3 height recordings:  Ht Readings from Last 3 Encounters:  03/17/14 5\' 2"  (1.575 m)  12/17/13 5\' 2"  (1.575 m)  11/03/13 5\' 2"  (1.575 m)   General appearance: alert, cooperative and appears stated age Head: Normocephalic, without obvious abnormality, atraumatic Neck: no adenopathy, no carotid bruit, no JVD, supple, symmetrical, trachea midline and thyroid not enlarged, symmetric, no tenderness/mass/nodules Lungs: clear to auscultation bilaterally Breasts: Inspection negative, No nipple retraction or dimpling, No nipple discharge or bleeding, No axillary or supraclavicular adenopathy, Normal to palpation without dominant masses Heart: regular rate and rhythm, S1, S2 normal, no murmur, click, rub or gallop Abdomen: soft, non-tender; bowel sounds normal; no masses,  no organomegaly Extremities: extremities normal, atraumatic, no cyanosis or edema Skin: Skin color, texture, turgor normal. No rashes or lesions Lymph nodes: Cervical, supraclavicular, and axillary nodes normal. no inguinal nodes palpated Neurologic: Grossly normal   Pelvic: External genitalia:  no lesions              Urethra: normal appearing urethra with no masses, tenderness or lesions              Bartholins and Skenes: normal                 Vagina: atrophic              Cervix: normal appearance              Pap taken: No.        Bimanual Exam:  Uterus:  uterus is normal size, shape, consistency and nontender                                      Adnexa:    no masses                                      Rectovaginal: Confirms  Anus:  normal sphincter tone, no lesions   1. Laboratory examination ordered as part of a routine general medical examination  - POCT Urinalysis Dipstick  2. Encounter for routine gynecological examination  counseled on breast self exam, mammography screening,  Osteoporosis-will confirm with PCP and have testing if not done previously, adequate intake of calcium and vitamin D, diet and exercise return annually or prn Discussed PAP guideline changes, importance of weight bearing exercises, calcium, vit D and balanced diet.   An After Visit Summary was printed and given to the patient.

## 2014-06-08 ENCOUNTER — Encounter: Payer: Self-pay | Admitting: Gynecology

## 2014-06-23 ENCOUNTER — Other Ambulatory Visit (HOSPITAL_COMMUNITY): Payer: Self-pay | Admitting: Family Medicine

## 2014-06-23 DIAGNOSIS — Z1231 Encounter for screening mammogram for malignant neoplasm of breast: Secondary | ICD-10-CM

## 2014-06-29 ENCOUNTER — Other Ambulatory Visit: Payer: Self-pay | Admitting: Cardiology

## 2014-06-29 ENCOUNTER — Other Ambulatory Visit: Payer: Self-pay | Admitting: *Deleted

## 2014-06-29 MED ORDER — METOPROLOL SUCCINATE ER 25 MG PO TB24: 25.0000 mg | ORAL_TABLET | Freq: Every day | ORAL | Status: DC

## 2014-07-10 ENCOUNTER — Ambulatory Visit (HOSPITAL_COMMUNITY)
Admission: RE | Admit: 2014-07-10 | Discharge: 2014-07-10 | Disposition: A | Discharge status: Home / Self Care | Payer: BC Managed Care – PPO | Source: Ambulatory Visit | Attending: Family Medicine | Admitting: Family Medicine

## 2014-07-10 DIAGNOSIS — Z1231 Encounter for screening mammogram for malignant neoplasm of breast: Secondary | ICD-10-CM | POA: Diagnosis not present

## 2014-07-16 ENCOUNTER — Ambulatory Visit: Payer: BC Managed Care – PPO | Admitting: Cardiology

## 2014-07-16 ENCOUNTER — Encounter (HOSPITAL_COMMUNITY): Payer: Self-pay | Admitting: Cardiovascular Disease

## 2014-07-17 ENCOUNTER — Ambulatory Visit: Payer: BC Managed Care – PPO | Admitting: Cardiology

## 2014-08-10 ENCOUNTER — Ambulatory Visit (INDEPENDENT_AMBULATORY_CARE_PROVIDER_SITE_OTHER): Payer: BLUE CROSS/BLUE SHIELD | Admitting: Cardiology

## 2014-08-10 ENCOUNTER — Encounter: Payer: Self-pay | Admitting: Cardiology

## 2014-08-10 VITALS — BP 120/68 | HR 61 | Ht 62.0 in | Wt 167.8 lb

## 2014-08-10 DIAGNOSIS — E7800 Pure hypercholesterolemia, unspecified: Secondary | ICD-10-CM

## 2014-08-10 DIAGNOSIS — I2583 Coronary atherosclerosis due to lipid rich plaque: Secondary | ICD-10-CM

## 2014-08-10 DIAGNOSIS — I208 Other forms of angina pectoris: Secondary | ICD-10-CM

## 2014-08-10 DIAGNOSIS — I2510 Atherosclerotic heart disease of native coronary artery without angina pectoris: Secondary | ICD-10-CM

## 2014-08-10 DIAGNOSIS — I10 Essential (primary) hypertension: Secondary | ICD-10-CM

## 2014-08-10 LAB — COMPREHENSIVE METABOLIC PANEL
ALT: 20 U/L (ref 0–35)
AST: 22 U/L (ref 0–37)
Albumin: 3.9 g/dL (ref 3.5–5.2)
Alkaline Phosphatase: 73 U/L (ref 39–117)
BUN: 19 mg/dL (ref 6–23)
CO2: 31 mEq/L (ref 19–32)
Calcium: 9.2 mg/dL (ref 8.4–10.5)
Chloride: 99 mEq/L (ref 96–112)
Creatinine, Ser: 0.8 mg/dL (ref 0.4–1.2)
GFR: 80.16 mL/min (ref >60.00)
Glucose, Bld: 103 mg/dL — ABNORMAL HIGH (ref 70–99)
Potassium: 3.8 mEq/L (ref 3.5–5.1)
Sodium: 137 mEq/L (ref 135–145)
Total Bilirubin: 0.8 mg/dL (ref 0.2–1.2)
Total Protein: 7.3 g/dL (ref 6.0–8.3)

## 2014-08-10 LAB — LIPID PANEL
Cholesterol: 146 mg/dL (ref 0–200)
HDL: 61.2 mg/dL (ref >39.00)
LDL Cholesterol: 69 mg/dL (ref 0–99)
NonHDL: 84.8
Total CHOL/HDL Ratio: 2
Triglycerides: 79 mg/dL (ref 0.0–149.0)
VLDL: 15.8 mg/dL (ref 0.0–40.0)

## 2014-08-10 NOTE — Patient Instructions (Signed)
Your physician recommends that you continue on your current medications as directed. Please refer to the Current Medication list given to you today.    Your physician recommends that you return for lab work in: TODAY-  CMET AND LIPIDS      Your physician wants you to follow-up in: ONE YEAR WITH DR NELSON You will receive a reminder letter in the mail two months in advance. If you don't receive a letter, please call our office to schedule the follow-up appointment.   

## 2014-08-10 NOTE — Progress Notes (Signed)
Patient ID: Kerry Perez, female   DOB: [birth year], 64 y.o.   MRN: [id number]    History of Present Illness: Kerry Perez is seen back today for a post hospital visit. Seen for Dr. Delton See. She is a 64 year old female with a history of HTN, HLD, diet controlled DM, OA, GERD, positive family history for CAD who was recently evaluated in the office for chest pain and dyspnea. Cardiac Ct showed calcium score of 144 and one vessel CAD in the LAD.   She was then referred for cardiac cath - this showed a moderate LAD lesion with a nonischemic nuclear stress test. She will be managed medically.  Does have diastolic dysfunction - EF is normal at 60 to 65%. Her statin was increased. She was also noted to have mild AS by echo and normocytic anemia on her labs. The cardiac CT also showed some prominent lymphoid tissue - no constitutional symptoms - asked to follow up with PCP.  Comes back today. Says Dr. Kevan Ny - her PCP was not sure what to do about the finding on cardiac CT. No fever or chills. No cough. Overall feeling better than when she first presented. She has lost at least 10 pounds but she attributes of having edema. No problems with her cath site. No lasix taken since discharge. Breathing ok. She denies any chest pain in the last couples of weeks. She has started to walk on daily basis and feels okay about it.  03/17/2014 - the patient reports occasional lower retrosternal chest tightness that is not related to exertion. It is a very mild pressure. She also complains of orthostatic hypotension. She is planning to travel to [place]. No LE edema, DOE, orthopnea.   08/10/2014 - the patient is coming after 4 months, she reports the same symptoms of stable angina that are not related to exertion. She walks 3-4 miles a day, enjoys it and doesn't report any shortness of breath and only occasional chest pain. She is very compliant with her medicines. She has chronic knee pains and she does feel that those  Worse with use of atorvastatin. She denies any orthopnea, paroxysmal nocturnal dyspnea. She denies any palpitations or syncope.    Allergies  Allergen Reactions  . Lisinopril Other (See Comments)    Lips swelled   Past Medical History  Diagnosis Date  . Hypertension   . GERD (gastroesophageal reflux disease)     mild  . Hypercholesterolemia   . Diet-controlled diabetes mellitus   . Anemia   . Arthritis     "left knee"   . CAD (coronary artery disease)     a. Moderate LAD lesion by cath 10/15/13 with nonischemic nuclear stress test 10/17/13  . Diastolic CHF     a. Dx 10/2013 - normal EF but elevated LVEDP by cath.  . Aortic stenosis     a. Mild by echo 10/2013.  Marland Kitchen Abnormal cardiac CT angiography     a. Ancillary findings: prominent hilar lymphoid tissue nonspecific - possibly related to CHF and lymphatic congestion, clinical correlation for signs/sx of ymphoproliferative disorder or other malignancy is suggested - denied constitutional sx and will f/u with PCP.  Marland Kitchen Heart failure     Past Surgical History  Procedure Laterality Date  . Stomach surgery  1965    Hairball removed from stomach  . Knee arthroscopy Left 06/18/2013    Procedure: LEFT KNEE ARTHROSCOPY WITH DEBRIDEMENT;  Surgeon: Loanne Drilling, MD;  Location: [facility];  Service: Orthopedics;  Laterality: Left;  . Appendectomy  1965  . Breast biopsy Right ~ 2000; ~ 2002"    "both benign"  . Left heart catheterization with coronary angiogram N/A 10/15/2013    Procedure: LEFT HEART CATHETERIZATION WITH CORONARY ANGIOGRAM;  Surgeon: Iran Ouch, MD;  Location: [facility];  Service: Cardiovascular;  Laterality: N/A;    History  Smoking status  . Never Smoker   Smokeless tobacco  . Never Used    History  Alcohol Use  . 1.8 - 2.4 oz/week  . 3-4 Glasses of wine per week    Family History  Problem Relation Age of Onset  . Hypertension Mother   . Heart disease Mother     Review of Systems: The review of  systems is per the HPI.  All other systems were reviewed and are negative.  Physical Exam: BP 120/68 mmHg  Pulse 61  Ht 5\' 2"  (1.575 m)  Wt 167 lb 12.8 oz (76.114 kg)  BMI 30.68 kg/m2  LMP 08/07/1998 Patient is very pleasant and in no acute distress. [place] accent. She is obese. Skin is warm and dry. Color is normal.  HEENT is unremarkable. Normocephalic/atraumatic. PERRL. Sclera are nonicteric. Neck is supple. No masses. No JVD. Lungs are clear. Cardiac exam shows a regular rate and rhythm. She has a harsh outflow murmur noted.  Abdomen is soft. Extremities are without edema. Gait and ROM are intact. No gross neurologic deficits noted.  Wt Readings from Last 3 Encounters:  08/10/14 167 lb 12.8 oz (76.114 kg)  05/12/14 172 lb (78.019 kg)  03/17/14 175 lb 9.6 oz (79.652 kg)    LABORATORY DATA: EKG with sinus brady - unchanged. Reviewed with Dr. Delton See  Lab Results  Component Value Date   WBC 5.0 10/16/2013   HGB 10.0* 10/16/2013   HCT 29.8* 10/16/2013   PLT 244 10/16/2013   GLUCOSE 109* 12/17/2013   CHOL 138 10/17/2013   TRIG 98 10/17/2013   HDL 60 10/17/2013   LDLCALC 58 10/17/2013   ALT 23 12/17/2013   AST 23 12/17/2013   NA 136 12/17/2013   K 3.6 12/17/2013   CL 99 12/17/2013   CREATININE 0.8 12/17/2013   BUN 15 12/17/2013   CO2 30 12/17/2013   INR 0.96 10/15/2013   Echo Study Conclusions  - Left ventricle: The cavity size was normal. Wall thickness was increased in a pattern of mild LVH. Systolic function was normal. The estimated ejection fraction was in the range of 60% to 65%. Wall motion was normal; there were no regional wall motion abnormalities. Left ventricular diastolic function parameters were normal. - Aortic valve: There was very mild stenosis. Mild regurgitation. Valve area: 1.73cm^2(VTI). Valve area: 1.67cm^2 (Vmax). - Right atrium: The atrium was mildly dilated.  Coronary angiography:  Coronary dominance: right  Left Main: normal  Left  Anterior Descending (LAD): Normal in size and mildly calcified. There is a 50-60% discrete stenosis after the origin of second diagonal. The rest of the midsegment has minor irregularities. There is mild myocardial bridging noted in the midsegment.  1st diagonal (D1): Normal in size with 30% ostial stenosis.  2nd diagonal (D2): Normal in size with 20% ostial stenosis.  3rd diagonal (D3): Small in size with minor irregularities.  Circumflex (LCx): Normal in size and nondominant. The vessel has minor irregularities.  1st obtuse marginal: Very small in size.  2nd obtuse marginal: Normal in size with no significant disease.  3rd obtuse marginal: Normal in size with minor irregularities.  Right  Coronary Artery: normal in size and dominant. The stent percent proximal stenosis and 20% mid stenosis.  Posterior descending artery: normal in size with no significant disease.  Posterior AV segment: normal in size with no significant disease.  Posterolateral branchs: No significant disease. Left ventriculography: Left ventricular systolic function is normal , LVEF is estimated at 60 %, there is no significant mitral regurgitation  Final Conclusions:  1. Moderate mid LAD stenosis with mild mid myocardial bridging.  2.Normal LV systolic function with mildly elevated left ventricular end-diastolic pressure.  Recommendations:  I requested an echocardiogram. Recommend initial medical therapy. If the patient fails medical therapy, consider functional testing or pressure wire interrogation of the LAD  Lorine Bears MD, [facility]  10/15/2013, 7:47 PM  Chest CT 10/2013 IMPRESSION:  No evidence for mediastinal or hilar lymphadenopathy.  Probable bilateral tiny calcified granulomata. As the 2 mm nodule in  the left lower lobe cannot be definitely seen to be calcified,  followup by consensus criteria is suggested. If the patient is at  high risk for bronchogenic carcinoma, follow-up chest CT at 1 year  is recommended.  If the patient is at low risk, no follow-up is  needed. This recommendation follows the consensus statement:  Guidelines for Management of Small Pulmonary Nodules Detected on CT  Scans: A Statement from the Fleischner Society as published in  Radiology 2005; 237:395-400.  Electronically Signed  By: Kennith Center M.D.  On: 11/03/2013 12:06  CARDIAC CT IMPRESSION: 1. The appearance of the lungs suggests mild interstitial pulmonary edema. Given the presence of bilateral pleural effusions, findings suggest congestive heart failure. 2. Prominent lymphoid tissue in the hilar regions bilaterally is nonspecific. In the setting of potential congestive failure, this may be related to edema and lymphatic congestion, however, clinical correlation for signs and symptoms of the lymphoproliferative disorder or other malignancy is suggested.  Electronically Signed By: Trudie Reed M.D. On: 10/16/2013 18:18  IMPRESSION: 1. Coronary calcium score of 144. This was 77 percentile for age and sex matched control.  2. Normal origin or coronary arteries, right dominance.  3. One vessel coronary artery disease with significant lesions in the mid and possibly proximal LAD. Cardiac catheterization is recommended.  Tobias Alexander   Assessment / Plan:  1. S/P cardiac cath - moderate LAD disease, to manage medically - her initial symptoms of chest pain and dyspnea seem to be resolved. Stress test negative for ischemia, her current symptoms are very mild, for now we'll continue medical therapy and consider intervention if her symptoms persist or get worse.  2. Stable angina - unchanged ECG, advised to call us back with any worsening symptoms, on max medical therapy and recent negative stress test.   3. Chronic diastolic HF - compensated. Continue with current regimen.   4. Normocytic anemia - has seen her PCP with repeat labs  5. Abnormal cardiac CT - lymphoid tissue or coronary CT however a  followup CT chest showed only 2 mm lung nodule. No lymphadenopathy inhaler or mediastinal region. No mass in the neck area. Because the patient has never been a smoker we will repeat a chest CT in 1 year (March 2016).  6. Mild AS - will need following, repeat echo in 1 year.  7. Orthostatic hypotension - improved after discontinuation of Imdur  CMP and lipids today, follow up in 6 months.   Lars Masson 08/10/2014

## 2015-05-21 ENCOUNTER — Ambulatory Visit: Payer: BC Managed Care – PPO | Admitting: Obstetrics and Gynecology

## 2015-06-10 ENCOUNTER — Encounter: Payer: Self-pay | Admitting: Obstetrics and Gynecology

## 2015-06-10 ENCOUNTER — Ambulatory Visit (INDEPENDENT_AMBULATORY_CARE_PROVIDER_SITE_OTHER): Payer: Medicare Other | Admitting: Obstetrics and Gynecology

## 2015-06-10 VITALS — BP 124/60 | HR 72 | Resp 14 | Ht 61.75 in | Wt 167.0 lb

## 2015-06-10 DIAGNOSIS — Z01419 Encounter for gynecological examination (general) (routine) without abnormal findings: Secondary | ICD-10-CM

## 2015-06-10 DIAGNOSIS — N941 Unspecified dyspareunia: Secondary | ICD-10-CM

## 2015-06-10 DIAGNOSIS — N952 Postmenopausal atrophic vaginitis: Secondary | ICD-10-CM

## 2015-06-10 DIAGNOSIS — Z124 Encounter for screening for malignant neoplasm of cervix: Secondary | ICD-10-CM | POA: Diagnosis not present

## 2015-06-10 MED ORDER — ESTRADIOL 10 MCG VA TABS: 1.0000 | ORAL_TABLET | VAGINAL | Status: DC

## 2015-06-10 NOTE — Progress Notes (Signed)
Patient ID: Kerry Perez, female   DOB: [birth year], 64 y.o.   MRN: [id number] 64 y.o. [id number] MarriedCaucasianF here for annual exam.   No PMP bleeding. Sexually active, some pain with intercourse, lubricant helps a little bit.     Patient's last menstrual period was 08/07/1998.          Sexually active: Yes.   Rarely  The current method of family planning is post menopausal status.    Exercising: Yes.    walking and clod dancing  Smoker:  no  Health Maintenance: Pap:  04-28-13 WNL NEG HPV History of abnormal Pap:  no MMG:  07-10-14 WNL Colonoscopy:  Around 2009 WNL per patient BMD:   Not yet TDaP:  08-08-07 Gardasil: N/A  Labs and immunizations with primary   reports that she has never smoked. She has never used smokeless tobacco. She reports that she drinks about 2.4 - 3.0 oz of alcohol per week. She reports that she does not use illicit drugs.  Past Medical History  Diagnosis Date  . Hypertension   . GERD (gastroesophageal reflux disease)     mild  . Hypercholesterolemia   . Diet-controlled diabetes mellitus (HCC)   . Anemia   . Arthritis     "left knee"   . CAD (coronary artery disease)     a. Moderate LAD lesion by cath 10/15/13 with nonischemic nuclear stress test 10/17/13  . Diastolic CHF (HCC)     a. Dx 10/2013 - normal EF but elevated LVEDP by cath.  . Aortic stenosis     a. Mild by echo 10/2013.  Marland Kitchen Abnormal cardiac CT angiography     a. Ancillary findings: prominent hilar lymphoid tissue nonspecific - possibly related to CHF and lymphatic congestion, clinical correlation for signs/sx of ymphoproliferative disorder or other malignancy is suggested - denied constitutional sx and will f/u with PCP.  Marland Kitchen Heart failure [facility])     Past Surgical History  Procedure Laterality Date  . Stomach surgery  1965    Hairball removed from stomach  . Knee arthroscopy Left 06/18/2013    Procedure: LEFT KNEE ARTHROSCOPY WITH DEBRIDEMENT;  Surgeon: Loanne Drilling, MD;  Location: [facility];  Service: Orthopedics;  Laterality: Left;  . Appendectomy  1965  . Breast biopsy Right ~ 2000; ~ 2002"    "both benign"  . Left heart catheterization with coronary angiogram N/A 10/15/2013    Procedure: LEFT HEART CATHETERIZATION WITH CORONARY ANGIOGRAM;  Surgeon: Iran Ouch, MD;  Location: [facility];  Service: Cardiovascular;  Laterality: N/A;    Current Outpatient Prescriptions  Medication Sig Dispense Refill  . acetaminophen (TYLENOL) 325 MG tablet Take 650 mg by mouth as needed.    Marland Kitchen aspirin 81 MG tablet Take 81 mg by mouth daily.    Marland Kitchen atorvastatin (LIPITOR) 80 MG tablet Take 1 tablet (80 mg total) by mouth every evening. 30 tablet 6  . calcium-vitamin D (OSCAL WITH D) 500-200 MG-UNIT per tablet Take 1 tablet by mouth 2 (two) times daily.    . Cholecalciferol (VITAMIN D) 2000 UNITS CAPS Take 1 capsule by mouth daily.     . furosemide (LASIX) 40 MG tablet Take 1 tablet (40 mg total) by mouth daily as needed (shortness of breath, fluid weight gain or fluid retention). 30 tablet 1  . losartan-hydrochlorothiazide (HYZAAR) 100-25 MG per tablet Take 1 tablet by mouth daily.     . metoprolol succinate (TOPROL-XL) 25 MG 24 hr tablet Take 1 tablet (25 mg  total) by mouth daily. 90 tablet 3  . Multiple Vitamins-Minerals (MULTIVITAMIN PO) Take 1 tablet by mouth daily.     . nitroGLYCERIN (NITROSTAT) 0.4 MG SL tablet Place 1 tablet (0.4 mg total) under the tongue every 5 (five) minutes as needed for chest pain (up to 3 doses). 25 tablet 4   No current facility-administered medications for this visit.    Family History  Problem Relation Age of Onset  . Hypertension Mother   . Heart disease Mother   . Osteoporosis Sister     Review of Systems  Constitutional: Negative.   HENT: Negative.   Eyes: Negative.   Respiratory: Negative.   Cardiovascular: Negative.   Gastrointestinal: Negative.   Endocrine: Negative.   Genitourinary: Negative.   Musculoskeletal: Negative.   Skin:  Negative.   Allergic/Immunologic: Negative.   Neurological: Negative.   Psychiatric/Behavioral: Negative.     Exam:   BP 124/60 mmHg  Pulse 72  Resp 14  Ht 5' 1.75" (1.568 m)  Wt 167 lb (75.751 kg)  BMI 30.81 kg/m2  LMP 08/07/1998  Weight change: @ Height:   Height: 5' 1.75" (156.8 cm)  Ht Readings from Last 3 Encounters:  06/10/15 5' 1.75" (1.568 m)  08/10/14  (1.575 m)  05/12/14  (1.575 m)    General appearance: alert, cooperative and appears stated age Head: Normocephalic, without obvious abnormality, atraumatic Neck: no adenopathy, supple, symmetrical, trachea midline and thyroid normal to inspection and palpation Lungs: clear to auscultation bilaterally Breasts: normal appearance, no masses or tenderness Heart: regular rate and rhythm Abdomen: soft, non-tender; bowel sounds normal; no masses,  no organomegaly Extremities: extremities normal, atraumatic, no cyanosis or edema Skin: Skin color, texture, turgor normal. No rashes or lesions Lymph nodes: Cervical, supraclavicular, and axillary nodes normal. No abnormal inguinal nodes palpated Neurologic: Grossly normal   Pelvic: External genitalia:  no lesions              Urethra:  normal appearing urethra with no masses, tenderness or lesions              Bartholins and Skenes: normal                 Vagina: Atrophic, able to insert 2 fingers              Cervix: no lesions               Bimanual Exam:  Uterus:  normal size, contour, position, consistency, mobility, non-tender              Adnexa: no mass, fullness, tenderness               Rectovaginal: Confirms               Anus:  normal sphincter tone, no lesions  Chaperone was present for exam.  A:  Well Woman with normal exam  Vaginal atrophy  Dyspareunia   P:   No pap this year  Mammogram next month  Discussed breast self exam  Continue calcium and vit D  Labs and immunizations with primary MD  Colonoscopy UTD  Discussed vaginal  estrogen, discussed that systemic estrogen increases the risk of blood clots and MI, the amount of estrogen she should get from vaginal estrogen shouldn't raise her  blood levels of estrogen. Discussed that the risk should be very small. She can discuss it with her cardiologist  Script for vagifem given  Discussed lubrication and her controlling the rate and  depth of penetration with intercourse

## 2015-06-10 NOTE — Patient Instructions (Signed)

## 2015-06-15 DIAGNOSIS — M170 Bilateral primary osteoarthritis of knee: Secondary | ICD-10-CM | POA: Diagnosis not present

## 2015-06-15 DIAGNOSIS — M1712 Unilateral primary osteoarthritis, left knee: Secondary | ICD-10-CM | POA: Diagnosis not present

## 2015-06-15 DIAGNOSIS — M1711 Unilateral primary osteoarthritis, right knee: Secondary | ICD-10-CM | POA: Diagnosis not present

## 2015-06-24 ENCOUNTER — Other Ambulatory Visit: Payer: Self-pay | Admitting: Cardiology

## 2015-06-28 ENCOUNTER — Other Ambulatory Visit: Payer: Self-pay

## 2015-06-28 DIAGNOSIS — Z1231 Encounter for screening mammogram for malignant neoplasm of breast: Secondary | ICD-10-CM

## 2015-08-05 ENCOUNTER — Ambulatory Visit
Admission: RE | Admit: 2015-08-05 | Discharge: 2015-08-05 | Disposition: A | Discharge status: Home / Self Care | Payer: Medicare Other | Source: Ambulatory Visit

## 2015-08-05 DIAGNOSIS — Z1231 Encounter for screening mammogram for malignant neoplasm of breast: Secondary | ICD-10-CM | POA: Diagnosis not present

## 2015-08-12 ENCOUNTER — Ambulatory Visit (INDEPENDENT_AMBULATORY_CARE_PROVIDER_SITE_OTHER): Payer: Medicare Other | Admitting: Cardiology

## 2015-08-12 ENCOUNTER — Encounter: Payer: Self-pay | Admitting: Cardiology

## 2015-08-12 VITALS — BP 128/80 | HR 62 | Ht 62.0 in | Wt 164.0 lb

## 2015-08-12 DIAGNOSIS — I5031 Acute diastolic (congestive) heart failure: Secondary | ICD-10-CM | POA: Diagnosis not present

## 2015-08-12 DIAGNOSIS — E7800 Pure hypercholesterolemia, unspecified: Secondary | ICD-10-CM | POA: Diagnosis not present

## 2015-08-12 DIAGNOSIS — I10 Essential (primary) hypertension: Secondary | ICD-10-CM | POA: Diagnosis not present

## 2015-08-12 DIAGNOSIS — I350 Nonrheumatic aortic (valve) stenosis: Secondary | ICD-10-CM | POA: Diagnosis not present

## 2015-08-12 DIAGNOSIS — I2510 Atherosclerotic heart disease of native coronary artery without angina pectoris: Secondary | ICD-10-CM | POA: Diagnosis not present

## 2015-08-12 DIAGNOSIS — I2583 Coronary atherosclerosis due to lipid rich plaque: Secondary | ICD-10-CM

## 2015-08-12 DIAGNOSIS — R911 Solitary pulmonary nodule: Secondary | ICD-10-CM

## 2015-08-12 MED ORDER — METOPROLOL SUCCINATE ER 25 MG PO TB24: ORAL_TABLET | ORAL | Status: DC

## 2015-08-12 MED ORDER — ATORVASTATIN CALCIUM 80 MG PO TABS: 80.0000 mg | ORAL_TABLET | Freq: Every evening | ORAL | Status: DC

## 2015-08-12 MED ORDER — FUROSEMIDE 40 MG PO TABS: 40.0000 mg | ORAL_TABLET | Freq: Every day | ORAL | Status: AC | PRN

## 2015-08-12 NOTE — Progress Notes (Signed)
Patient ID: Kerry Perez, female   DOB: 18-Dec-1950, 65 y.o.   MRN: [id number]    Chief complain: follow up for CAD, chest pain with congestion  History of Present Illness: Kerry Perez is seen back today for a post hospital visit. Seen for Dr. Delton See. She is a 65 year old female with a history of HTN, HLD, diet controlled DM, OA, GERD, positive family history for CAD who was recently evaluated in the office for chest pain and dyspnea. Cardiac Ct showed calcium score of 144 and one vessel CAD in the LAD.   She was then referred for cardiac cath - this showed a moderate LAD lesion with a nonischemic nuclear stress test. She will be managed medically.  Does have diastolic dysfunction - EF is normal at 60 to 65%. Her statin was increased. She was also noted to have mild AS by echo and normocytic anemia on her labs. The cardiac CT also showed some prominent lymphoid tissue - no constitutional symptoms - asked to follow up with PCP.  Comes back today. Says Dr. Kevan Ny - her PCP was not sure what to do about the finding on cardiac CT. No fever or chills. No cough. Overall feeling better than when she first presented. She has lost at least 10 pounds but she attributes of having edema. No problems with her cath site. No lasix taken since discharge. Breathing ok. She denies any chest pain in the last couples of weeks. She has started to walk on daily basis and feels okay about it.  03/17/2014 - the patient reports occasional lower retrosternal chest tightness that is not related to exertion. It is a very mild pressure. She also complains of orthostatic hypotension. She is planning to travel to [place]. No LE edema, DOE, orthopnea.   08/12/2015- the patient is coming after 1 year, she is doing great, walking daily, following her steps, always > 10.000, no DOE, no chest pain other than in the last week when she has been dealing with cold. No LE edema, palpitations, syncope, no orthopnea, PND. Complaint with  meds, no muscle pain with atorvastatin.    Allergies  Allergen Reactions  . Lisinopril Other (See Comments)    Lips swelled   Past Medical History  Diagnosis Date  . Hypertension   . GERD (gastroesophageal reflux disease)     mild  . Hypercholesterolemia   . Diet-controlled diabetes mellitus (HCC)   . Anemia   . Arthritis     "left knee"   . CAD (coronary artery disease)     a. Moderate LAD lesion by cath 10/15/13 with nonischemic nuclear stress test 10/17/13  . Diastolic CHF (HCC)     a. Dx 10/2013 - normal EF but elevated LVEDP by cath.  . Aortic stenosis     a. Mild by echo 10/2013.  Marland Kitchen Abnormal cardiac CT angiography     a. Ancillary findings: prominent hilar lymphoid tissue nonspecific - possibly related to CHF and lymphatic congestion, clinical correlation for signs/sx of ymphoproliferative disorder or other malignancy is suggested - denied constitutional sx and will f/u with PCP.  Marland Kitchen Heart failure [facility])     Past Surgical History  Procedure Laterality Date  . Stomach surgery  1965    Hairball removed from stomach  . Knee arthroscopy Left 06/18/2013    Procedure: LEFT KNEE ARTHROSCOPY WITH DEBRIDEMENT;  Surgeon: Loanne Drilling, MD;  Location: [facility] ORS;  Service: Orthopedics;  Laterality: Left;  . Appendectomy  1965  . Breast biopsy  Right ~ 2000; ~ 2002"    "both benign"  . Left heart catheterization with coronary angiogram N/A 10/15/2013    Procedure: LEFT HEART CATHETERIZATION WITH CORONARY ANGIOGRAM;  Surgeon: Iran OuchMuhammad A Arida, MD;  Location: [facility] CATH LAB;  Service: Cardiovascular;  Laterality: N/A;    History  Smoking status  . Never Smoker   Smokeless tobacco  . Never Used    History  Alcohol Use  . 2.4 - 3.0 oz/week  . 4-5 Glasses of wine per week    Family History  Problem Relation Age of Onset  . Hypertension Mother   . Heart disease Mother   . Osteoporosis Sister     Review of Systems: The review of systems is per the HPI.  All other systems were  reviewed and are negative.  Physical Exam: BP 128/80 mmHg  Pulse 62  Ht 5\' 2"  (1.575 m)  Wt 164 lb (74.39 kg)  BMI 29.99 kg/m2  LMP 08/07/1998 Patient is very pleasant and in no acute distress. [name] Skin is warm and dry. Color is normal.  HEENT is unremarkable. Normocephalic/atraumatic. PERRL. Sclera are nonicteric. Neck is supple. No masses. No JVD. Lungs are clear. Cardiac exam shows a regular rate and rhythm. She has a 3/6 systolic murmur,  Abdomen is soft. Extremities are without edema. Gait and ROM are intact. No gross neurologic deficits noted.  Wt Readings from Last 3 Encounters:  08/12/15 164 lb (74.39 kg)  06/10/15 167 lb (75.751 kg)  08/10/14 167 lb 12.8 oz (76.114 kg)    LABORATORY DATA: EKG with sinus brady - unchanged. Reviewed with Dr. Delton SeeNelson  Lab Results  Component Value Date   WBC 5.0 10/16/2013   HGB 10.0* 10/16/2013   HCT 29.8* 10/16/2013   PLT 244 10/16/2013   GLUCOSE 103* 08/10/2014   CHOL 146 08/10/2014   TRIG 79.0 08/10/2014   HDL 61.20 08/10/2014   LDLCALC 69 08/10/2014   ALT 20 08/10/2014   AST 22 08/10/2014   NA 137 08/10/2014   K 3.8 08/10/2014   CL 99 08/10/2014   CREATININE 0.8 08/10/2014   BUN 19 08/10/2014   CO2 31 08/10/2014   INR 0.96 10/15/2013   Echo Study Conclusions  - Left ventricle: The cavity size was normal. Wall thickness was increased in a pattern of mild LVH. Systolic function was normal. The estimated ejection fraction was in the range of 60% to 65%. Wall motion was normal; there were no regional wall motion abnormalities. Left ventricular diastolic function parameters were normal. - Aortic valve: There was very mild stenosis. Mild regurgitation. Valve area: 1.73cm^2(VTI). Valve area: 1.67cm^2 (Vmax). - Right atrium: The atrium was mildly dilated.  Coronary angiography:  Coronary dominance: right  Left Main: normal  Left Anterior Descending (LAD): Normal in size and mildly calcified. There is  a 50-60% discrete stenosis after the origin of second diagonal. The rest of the midsegment has minor irregularities. There is mild myocardial bridging noted in the midsegment.  1st diagonal (D1): Normal in size with 30% ostial stenosis.  2nd diagonal (D2): Normal in size with 20% ostial stenosis.  3rd diagonal (D3): Small in size with minor irregularities.  Circumflex (LCx): Normal in size and nondominant. The vessel has minor irregularities.  1st obtuse marginal: Very small in size.  2nd obtuse marginal: Normal in size with no significant disease.  3rd obtuse marginal: Normal in size with minor irregularities.  Right Coronary Artery: normal in size and dominant. The stent percent proximal  stenosis and 20% mid stenosis.  Posterior descending artery: normal in size with no significant disease.  Posterior AV segment: normal in size with no significant disease.  Posterolateral branchs: No significant disease. Left ventriculography: Left ventricular systolic function is normal , LVEF is estimated at 60 %, there is no significant mitral regurgitation  Final Conclusions:  1. Moderate mid LAD stenosis with mild mid myocardial bridging.  2.Normal LV systolic function with mildly elevated left ventricular end-diastolic pressure.  Recommendations:  I requested an echocardiogram. Recommend initial medical therapy. If the patient fails medical therapy, consider functional testing or pressure wire interrogation of the LAD  Lorine Bears MD, [facility]  10/15/2013, 7:47 PM  Chest CT 10/2013 IMPRESSION:  No evidence for mediastinal or hilar lymphadenopathy.  Probable bilateral tiny calcified granulomata. As the 2 mm nodule in  the left lower lobe cannot be definitely seen to be calcified,  followup by consensus criteria is suggested. If the patient is at  high risk for bronchogenic carcinoma, follow-up chest CT at 1 year  is recommended. If the patient is at low risk, no follow-up is  needed. This  recommendation follows the consensus statement:  Guidelines for Management of Small Pulmonary Nodules Detected on CT  Scans: A Statement from the Fleischner Society as published in  Radiology 2005; 237:395-400.  Electronically Signed  By: Kennith Center M.D.  On: 11/03/2013 12:06  CARDIAC CT IMPRESSION: 1. The appearance of the lungs suggests mild interstitial pulmonary edema. Given the presence of bilateral pleural effusions, findings suggest congestive heart failure. 2. Prominent lymphoid tissue in the hilar regions bilaterally is nonspecific. In the setting of potential congestive failure, this may be related to edema and lymphatic congestion, however, clinical correlation for signs and symptoms of the lymphoproliferative disorder or other malignancy is suggested.  Electronically Signed By: Trudie Reed M.D. On: 10/16/2013 18:18  IMPRESSION: 1. Coronary calcium score of 144. This was 67 percentile for age and sex matched control.  2. Normal origin or coronary arteries, right dominance.  3. One vessel coronary artery disease with significant lesions in the mid and possibly proximal LAD. Cardiac catheterization is recommended.  Tobias Alexander   Assessment / Plan:  1. CAD - moderate LAD disease on a cath in 12/2012, to manage medically - her initial symptoms of chest pain and dyspnea seem to be resolved. Stress test negative for ischemia, she is asymptomatic on optimal medical therapy. Very active!  2. Chronic diastolic HF - compensated. Continue with current regimen.   3. Normocytic anemia - has seen her PCP with repeat labs  4. Abnormal cardiac CT - lymphoid tissue or coronary CT however a followup CT chest showed only 2 mm lung nodule. No lymphadenopathy inhaler or mediastinal region. No mass in the neck area. Because the patient has never been a smoker we will repeat a chest CT now.  5. Mild AS - will need following, repeat echo in 1 year prior to the next  visit.  CMP and lipids today, follow up in 1 year.   Lars Masson 08/12/2015

## 2015-08-12 NOTE — Patient Instructions (Addendum)
Medication Instructions:   Your physician recommends that you continue on your current medications as directed. Please refer to the Current Medication list given to you today.   Labwork:  ASAP---TO CHECK---CMET, CBC W DIFF, TSH, AND LIPIDS---PLEASE COME FASTING TO THIS LAB APPOINTMENT   Testing/Procedures:  Your physician has requested that you have an echocardiogram. Echocardiography is a painless test that uses sound waves to create images of your heart. It provides your doctor with information about the size and shape of your heart and how well your heart's chambers and valves are working. This procedure takes approximately one hour. There are no restrictions for this procedure.  PER DR NELSON THIS SHOULD BE SCHEDULED IN AROUND 10-11 MONTHS, PRIOR TO YOUR ONE YEAR FOLLOW-UP APPOINTMENT WITH DR NELSON   Non-Cardiac CT scanning, (CAT scanning), is a noninvasive, special x-ray that produces cross-sectional images of the body using x-rays and a computer. CT scans help physicians diagnose and treat medical conditions. For some CT exams, a contrast material is used to enhance visibility in the area of the body being studied. CT scans provide greater clarity and reveal more details than regular x-ray exams.  DR NELSON HAS ORDERED FOR YOU TO HAVE A CHEST CT WITHOUT CONTRAST TO BE DONE AT [facility]---THIS IS FOR FOLLOW-UP OF PREVIOUSLY FOUND LEFT LUNG NODULE     Follow-Up:  Your physician wants you to follow-up in: ONE YEAR WITH DR Johnell ComingsNELSON You will receive a reminder letter in the mail two months in advance. If you don't receive a letter, please call our office to schedule the follow-up appointment.    PER DR NELSON THIS SHOULD BE SCHEDULED IN AROUND 10-11 MONTHS, PRIOR TO YOUR ONE YEAR FOLLOW-UP APPOINTMENT WITH DR Delton SeeNELSON     If you need a refill on your cardiac medications before your next appointment, please call your pharmacy.

## 2015-08-13 ENCOUNTER — Other Ambulatory Visit (INDEPENDENT_AMBULATORY_CARE_PROVIDER_SITE_OTHER): Payer: Medicare Other | Admitting: *Deleted

## 2015-08-13 DIAGNOSIS — I10 Essential (primary) hypertension: Secondary | ICD-10-CM

## 2015-08-13 DIAGNOSIS — I350 Nonrheumatic aortic (valve) stenosis: Secondary | ICD-10-CM

## 2015-08-13 DIAGNOSIS — E7800 Pure hypercholesterolemia, unspecified: Secondary | ICD-10-CM

## 2015-08-13 DIAGNOSIS — I359 Nonrheumatic aortic valve disorder, unspecified: Secondary | ICD-10-CM | POA: Diagnosis not present

## 2015-08-13 DIAGNOSIS — I5031 Acute diastolic (congestive) heart failure: Secondary | ICD-10-CM

## 2015-08-13 LAB — CBC WITH DIFFERENTIAL/PLATELET
Basophils Absolute: 0.1 10*3/uL (ref 0.0–0.1)
Basophils Relative: 1 % (ref 0–1)
Eosinophils Absolute: 0.2 10*3/uL (ref 0.0–0.7)
Eosinophils Relative: 4 % (ref 0–5)
HCT: 36.3 % (ref 36.0–46.0)
Hemoglobin: 12.1 g/dL (ref 12.0–15.0)
Lymphocytes Relative: 44 % (ref 12–46)
Lymphs Abs: 2.3 10*3/uL (ref 0.7–4.0)
MCH: 30.8 pg (ref 26.0–34.0)
MCHC: 33.3 g/dL (ref 30.0–36.0)
MCV: 92.4 fL (ref 78.0–100.0)
MPV: 10.5 fL (ref 8.6–12.4)
Monocytes Absolute: 0.7 10*3/uL (ref 0.1–1.0)
Monocytes Relative: 13 % — ABNORMAL HIGH (ref 3–12)
Neutro Abs: 2 10*3/uL (ref 1.7–7.7)
Neutrophils Relative %: 38 % — ABNORMAL LOW (ref 43–77)
Platelets: 317 10*3/uL (ref 150–400)
RBC: 3.93 MIL/uL (ref 3.87–5.11)
RDW: 12.9 % (ref 11.5–15.5)
WBC: 5.2 10*3/uL (ref 4.0–10.5)

## 2015-08-13 LAB — LIPID PANEL
Cholesterol: 156 mg/dL (ref 125–200)
HDL: 87 mg/dL (ref >=46)
LDL Cholesterol: 58 mg/dL (ref <130)
Total CHOL/HDL Ratio: 1.8 Ratio (ref <=5.0)
Triglycerides: 56 mg/dL (ref <150)
VLDL: 11 mg/dL (ref <30)

## 2015-08-13 LAB — COMPREHENSIVE METABOLIC PANEL
ALT: 16 U/L (ref 6–29)
AST: 24 U/L (ref 10–35)
Albumin: 3.8 g/dL (ref 3.6–5.1)
Alkaline Phosphatase: 75 U/L (ref 33–130)
BUN: 20 mg/dL (ref 7–25)
CO2: 30 mmol/L (ref 20–31)
Calcium: 9.1 mg/dL (ref 8.6–10.4)
Chloride: 98 mmol/L (ref 98–110)
Creat: 0.81 mg/dL (ref 0.50–0.99)
Glucose, Bld: 99 mg/dL (ref 65–99)
Potassium: 3.6 mmol/L (ref 3.5–5.3)
Sodium: 136 mmol/L (ref 135–146)
Total Bilirubin: 0.5 mg/dL (ref 0.2–1.2)
Total Protein: 7 g/dL (ref 6.1–8.1)

## 2015-08-13 LAB — TSH: TSH: 1.734 u[IU]/mL (ref 0.350–4.500)

## 2015-08-18 ENCOUNTER — Ambulatory Visit (HOSPITAL_COMMUNITY)
Admission: RE | Admit: 2015-08-18 | Discharge: 2015-08-18 | Disposition: A | Discharge status: Home / Self Care | Payer: Medicare Other | Source: Ambulatory Visit | Attending: Cardiology | Admitting: Cardiology

## 2015-08-18 DIAGNOSIS — R911 Solitary pulmonary nodule: Secondary | ICD-10-CM | POA: Diagnosis not present

## 2015-08-18 DIAGNOSIS — I2510 Atherosclerotic heart disease of native coronary artery without angina pectoris: Secondary | ICD-10-CM | POA: Diagnosis not present

## 2015-08-18 DIAGNOSIS — I700 Atherosclerosis of aorta: Secondary | ICD-10-CM | POA: Diagnosis not present

## 2015-08-26 DIAGNOSIS — I10 Essential (primary) hypertension: Secondary | ICD-10-CM | POA: Diagnosis not present

## 2015-08-26 DIAGNOSIS — E119 Type 2 diabetes mellitus without complications: Secondary | ICD-10-CM | POA: Diagnosis not present

## 2015-08-26 DIAGNOSIS — Z0000 Encounter for general adult medical examination without abnormal findings: Secondary | ICD-10-CM | POA: Diagnosis not present

## 2015-08-26 DIAGNOSIS — I350 Nonrheumatic aortic (valve) stenosis: Secondary | ICD-10-CM | POA: Diagnosis not present

## 2015-08-26 DIAGNOSIS — Z23 Encounter for immunization: Secondary | ICD-10-CM | POA: Diagnosis not present

## 2015-08-26 DIAGNOSIS — E7800 Pure hypercholesterolemia, unspecified: Secondary | ICD-10-CM | POA: Diagnosis not present

## 2015-08-26 DIAGNOSIS — I2510 Atherosclerotic heart disease of native coronary artery without angina pectoris: Secondary | ICD-10-CM | POA: Diagnosis not present

## 2015-09-30 DIAGNOSIS — M810 Age-related osteoporosis without current pathological fracture: Secondary | ICD-10-CM | POA: Diagnosis not present

## 2016-03-09 DIAGNOSIS — E669 Obesity, unspecified: Secondary | ICD-10-CM | POA: Diagnosis not present

## 2016-03-09 DIAGNOSIS — I350 Nonrheumatic aortic (valve) stenosis: Secondary | ICD-10-CM | POA: Diagnosis not present

## 2016-03-09 DIAGNOSIS — I10 Essential (primary) hypertension: Secondary | ICD-10-CM | POA: Diagnosis not present

## 2016-03-09 DIAGNOSIS — E119 Type 2 diabetes mellitus without complications: Secondary | ICD-10-CM | POA: Diagnosis not present

## 2016-03-09 DIAGNOSIS — I2510 Atherosclerotic heart disease of native coronary artery without angina pectoris: Secondary | ICD-10-CM | POA: Diagnosis not present

## 2016-03-09 DIAGNOSIS — E7800 Pure hypercholesterolemia, unspecified: Secondary | ICD-10-CM | POA: Diagnosis not present

## 2016-04-14 DIAGNOSIS — E119 Type 2 diabetes mellitus without complications: Secondary | ICD-10-CM | POA: Diagnosis not present

## 2016-05-10 DIAGNOSIS — Z23 Encounter for immunization: Secondary | ICD-10-CM | POA: Diagnosis not present

## 2016-06-14 ENCOUNTER — Ambulatory Visit: Payer: Medicare Other | Admitting: Obstetrics and Gynecology

## 2016-06-26 ENCOUNTER — Telehealth: Payer: Self-pay | Admitting: Cardiology

## 2016-06-26 DIAGNOSIS — R079 Chest pain, unspecified: Secondary | ICD-10-CM | POA: Diagnosis not present

## 2016-06-26 NOTE — Telephone Encounter (Signed)
Pt is calling to inform Dr Delton SeeNelson that she is in [place] until the beginning of the year, to visit family.  Pt states that she went to Urgent Care over the weekend for pain under her breast area, and was diagnosed with Pleurisy.  Pt states that the MD at Urgent Care advised for her to take Tylenol, and she referred her to a Cardiologist in [place] for further follow-up, due to being away from Dr Delton SeeNelson for such an extended period of time.  Pt wanted to know should she see take this advice and see a Cardiologist while in [place], or should she just wait until she see's Dr Delton SeeNelson on 08/16/16.  Advised the pt that she should go ahead and follow through with seeing the Cardiologist she was referred to in [place].  Informed the pt that the Urgent Care MD more than likely recommended this,  due to her cardiac hx, and to rule out any cardiac related issues.  Informed the pt that we will be glad to send any records they may need, to help assist with her care.  Informed the pt that I will send Dr Delton SeeNelson this message for further review and any further recommendations, and follow-up with her shortly thereafter. Informed the pt that once she is back from [place], we will be able to follow-up with her, at her OV with Dr Delton SeeNelson on 08/16/16.  Pt verbalized understanding and agrees with this plan.

## 2016-06-26 NOTE — Telephone Encounter (Signed)
Mrs. Kerry Perez is calling because she had to go to the Urgent care for some pain under her breast , and she is in [place] . The  Doctor at the urgent care thought that it could be Pleuritis and referred her to a cardiologist there in [place].   Mrs, Kerry Perez is wanting to speak w/ someone here , to see if should go that route . Please call

## 2016-06-27 DIAGNOSIS — R079 Chest pain, unspecified: Secondary | ICD-10-CM | POA: Diagnosis not present

## 2016-06-27 DIAGNOSIS — E7800 Pure hypercholesterolemia, unspecified: Secondary | ICD-10-CM | POA: Diagnosis not present

## 2016-06-27 DIAGNOSIS — I2510 Atherosclerotic heart disease of native coronary artery without angina pectoris: Secondary | ICD-10-CM | POA: Diagnosis not present

## 2016-06-27 DIAGNOSIS — I10 Essential (primary) hypertension: Secondary | ICD-10-CM | POA: Diagnosis not present

## 2016-06-28 DIAGNOSIS — R079 Chest pain, unspecified: Secondary | ICD-10-CM | POA: Diagnosis not present

## 2016-06-28 DIAGNOSIS — I10 Essential (primary) hypertension: Secondary | ICD-10-CM | POA: Diagnosis not present

## 2016-06-28 DIAGNOSIS — R0789 Other chest pain: Secondary | ICD-10-CM | POA: Diagnosis not present

## 2016-06-28 DIAGNOSIS — I2510 Atherosclerotic heart disease of native coronary artery without angina pectoris: Secondary | ICD-10-CM | POA: Diagnosis not present

## 2016-06-28 DIAGNOSIS — I082 Rheumatic disorders of both aortic and tricuspid valves: Secondary | ICD-10-CM | POA: Diagnosis not present

## 2016-06-28 NOTE — Telephone Encounter (Signed)
Left a detailed message on the pts confirmed VM that per Dr Delton SeeNelson, she should see a Cardiologist in [place], until she can return to see us in January, for safe measures.  Also left a detailed message informing her that per Dr Delton SeeNelson, it is safe for her to take Ibuprofen as needed for her Pleurisy pain.  Advised her to take as instructed on the bottle, and take no longer than 2 weeks.  Left a detailed message for the pt to call back to the office next week with any further questions or concerns regarding this issue.   Noted Dr Delton SeeNelson did try to reach out to the pt as well, and left her a message to call back.

## 2016-08-14 ENCOUNTER — Ambulatory Visit (INDEPENDENT_AMBULATORY_CARE_PROVIDER_SITE_OTHER): Payer: Medicare Other | Admitting: Cardiology

## 2016-08-14 DIAGNOSIS — I350 Nonrheumatic aortic (valve) stenosis: Secondary | ICD-10-CM

## 2016-08-14 DIAGNOSIS — I10 Essential (primary) hypertension: Secondary | ICD-10-CM

## 2016-08-14 DIAGNOSIS — I2510 Atherosclerotic heart disease of native coronary artery without angina pectoris: Secondary | ICD-10-CM | POA: Diagnosis not present

## 2016-08-14 DIAGNOSIS — R911 Solitary pulmonary nodule: Secondary | ICD-10-CM | POA: Diagnosis not present

## 2016-08-14 DIAGNOSIS — I2583 Coronary atherosclerosis due to lipid rich plaque: Secondary | ICD-10-CM | POA: Diagnosis not present

## 2016-08-14 MED ORDER — LOSARTAN POTASSIUM-HCTZ 50-12.5 MG PO TABS: 1.0000 | ORAL_TABLET | Freq: Every day | ORAL | 3 refills | Status: DC

## 2016-08-14 MED ORDER — ATORVASTATIN CALCIUM 40 MG PO TABS: 40.0000 mg | ORAL_TABLET | Freq: Every day | ORAL | 3 refills | Status: DC

## 2016-08-14 NOTE — Patient Instructions (Signed)
Medication Instructions:   DECREASE YOUR ATORVASTATIN TO 40 MG ONCE DAILY  DECREASE YOUR HYZAAR TO 50/12.5MG  ONCE DAILY    Follow-Up:  Your physician wants you to follow-up in: 6 MONTHS WITH DR Johnell ComingsNELSON You will receive a reminder letter in the mail two months in advance. If you don't receive a letter, please call our office to schedule the follow-up appointment.        If you need a refill on your cardiac medications before your next appointment, please call your pharmacy.

## 2016-08-14 NOTE — Progress Notes (Signed)
Patient ID: Kerry Perez, female   DOB: 01/03/1951, 66 y.o.   MRN: [id number]    Chief complain: follow up for CAD, chest pain with congestion  History of Present Illness: Kerry Perez is seen back today for a post hospital visit. Seen for Dr. Delton See. She is a 66 year old female with a history of HTN, HLD, diet controlled DM, OA, GERD, positive family history for CAD who was recently evaluated in the office for chest pain and dyspnea. Cardiac Ct showed calcium score of 144 and one vessel CAD in the LAD.   She was then referred for cardiac cath - this showed a moderate LAD lesion with a nonischemic nuclear stress test. She will be managed medically.  Does have diastolic dysfunction - EF is normal at 60 to 65%. Her statin was increased. She was also noted to have mild AS by echo and normocytic anemia on her labs. The cardiac CT also showed some prominent lymphoid tissue - no constitutional symptoms - asked to follow up with PCP.  Comes back today. Says Dr. Kevan Ny - her PCP was not sure what to do about the finding on cardiac CT. No fever or chills. No cough. Overall feeling better than when she first presented. She has lost at least 10 pounds but she attributes of having edema. No problems with her cath site. No lasix taken since discharge. Breathing ok. She denies any chest pain in the last couples of weeks. She has started to walk on daily basis and feels okay about it.  03/17/2014 - the patient reports occasional lower retrosternal chest tightness that is not related to exertion. It is a very mild pressure. She also complains of orthostatic hypotension. She is planning to travel to [place]. No LE edema, DOE, orthopnea.   08/12/2015- the patient is coming after 1 year, she is doing great, walking daily, following her steps, always > 10.000, no DOE, no chest pain other than in the last week when she has been dealing with cold. No LE edema, palpitations, syncope, no orthopnea, PND. Complaint with  meds, no muscle pain with atorvastatin.  08/14/2016 - the patient is coming after one year, in the meantime she has been exercising and eating healthy and feeling well. She has spent a lot of time in the [place] with her grandchildren and developed left sided chest pain with position changes and with deep inspiration. She was seen in the urgent care setting and by cardiologist in [place] and they concluded that this is related to pleurisy. She used ibuprofen with complete resolution of her chest pain. She has been compliant with her medicines and has no side effects and would like to cut down some.    Allergies  Allergen Reactions  . Lisinopril Other (See Comments)    Lips swelled   Past Medical History:  Diagnosis Date  . Abnormal cardiac CT angiography    a. Ancillary findings: prominent hilar lymphoid tissue nonspecific - possibly related to CHF and lymphatic congestion, clinical correlation for signs/sx of ymphoproliferative disorder or other malignancy is suggested - denied constitutional sx and will f/u with PCP.  Marland Kitchen Anemia   . Aortic stenosis    a. Mild by echo 10/2013.  Marland Kitchen Arthritis    "left knee"   . CAD (coronary artery disease)    a. Moderate LAD lesion by cath 10/15/13 with nonischemic nuclear stress test 10/17/13  . Diastolic CHF (HCC)    a. Dx 10/2013 - normal EF but elevated LVEDP by cath.  Marland Kitchen  Diet-controlled diabetes mellitus (HCC)   . GERD (gastroesophageal reflux disease)    mild  . Heart failure (HCC)   . Hypercholesterolemia   . Hypertension     Past Surgical History:  Procedure Laterality Date  . APPENDECTOMY  1965  . BREAST BIOPSY Right ~ 2000; ~ 2002"   "both benign"  . KNEE ARTHROSCOPY Left 06/18/2013   Procedure: LEFT KNEE ARTHROSCOPY WITH DEBRIDEMENT;  Surgeon: Loanne Drilling, MD;  Location: [facility];  Service: Orthopedics;  Laterality: Left;  . LEFT HEART CATHETERIZATION WITH CORONARY ANGIOGRAM N/A 10/15/2013   Procedure: LEFT HEART CATHETERIZATION WITH  CORONARY ANGIOGRAM;  Surgeon: Iran Ouch, MD;  Location: [facility];  Service: Cardiovascular;  Laterality: N/A;  . STOMACH SURGERY  1965   Hairball removed from stomach    History  Smoking Status  . Never Smoker  Smokeless Tobacco  . Never Used    History  Alcohol Use  . 2.4 - 3.0 oz/week  . 4 - 5 Glasses of wine per week    Family History  Problem Relation Age of Onset  . Hypertension Mother   . Heart disease Mother   . Osteoporosis Sister     Review of Systems: The review of systems is per the HPI.  All other systems were reviewed and are negative.  Physical Exam: LMP 08/07/1998  Patient is very pleasant and in no acute distress. [place] accent. She is obese. Skin is warm and dry. Color is normal.  HEENT is unremarkable. Normocephalic/atraumatic. PERRL. Sclera are nonicteric. Neck is supple. No masses. No JVD. Lungs are clear. Cardiac exam shows a regular rate and rhythm. She has a 3/6 systolic murmur,  Abdomen is soft. Extremities are without edema. Gait and ROM are intact. No gross neurologic deficits noted.  Wt Readings from Last 3 Encounters:  08/12/15 164 lb (74.4 kg)  06/10/15 167 lb (75.8 kg)  08/10/14 167 lb 12.8 oz (76.1 kg)    LABORATORY DATA: EKG with sinus brady - unchanged. Reviewed with Dr. Delton See  Lab Results  Component Value Date   WBC 5.2 08/13/2015   HGB 12.1 08/13/2015   HCT 36.3 08/13/2015   PLT 317 08/13/2015   GLUCOSE 99 08/13/2015   CHOL 156 08/13/2015   TRIG 56 08/13/2015   HDL 87 08/13/2015   LDLCALC 58 08/13/2015   ALT 16 08/13/2015   AST 24 08/13/2015   NA 136 08/13/2015   K 3.6 08/13/2015   CL 98 08/13/2015   CREATININE 0.81 08/13/2015   BUN 20 08/13/2015   CO2 30 08/13/2015   TSH 1.734 08/13/2015   INR 0.96 10/15/2013   Echo Study Conclusions  - Left ventricle: The cavity size was normal. Wall thickness was increased in a pattern of mild LVH. Systolic function was normal. The estimated ejection fraction was in  the range of 60% to 65%. Wall motion was normal; there were no regional wall motion abnormalities. Left ventricular diastolic function parameters were normal. - Aortic valve: There was very mild stenosis. Mild regurgitation. Valve area: 1.73cm^2(VTI). Valve area: 1.67cm^2 (Vmax). - Right atrium: The atrium was mildly dilated.  Coronary angiography:  Coronary dominance: right  Left Main: normal  Left Anterior Descending (LAD): Normal in size and mildly calcified. There is a 50-60% discrete stenosis after the origin of second diagonal. The rest of the midsegment has minor irregularities. There is mild myocardial bridging noted in the midsegment.  1st diagonal (D1): Normal in size with 30% ostial stenosis.  2nd diagonal (D2):  Normal in size with 20% ostial stenosis.  3rd diagonal (D3): Small in size with minor irregularities.  Circumflex (LCx): Normal in size and nondominant. The vessel has minor irregularities.  1st obtuse marginal: Very small in size.  2nd obtuse marginal: Normal in size with no significant disease.  3rd obtuse marginal: Normal in size with minor irregularities.  Right Coronary Artery: normal in size and dominant. The stent percent proximal stenosis and 20% mid stenosis.  Posterior descending artery: normal in size with no significant disease.  Posterior AV segment: normal in size with no significant disease.  Posterolateral branchs: No significant disease. Left ventriculography: Left ventricular systolic function is normal , LVEF is estimated at 60 %, there is no significant mitral regurgitation  Final Conclusions:  1. Moderate mid LAD stenosis with mild mid myocardial bridging.  2.Normal LV systolic function with mildly elevated left ventricular end-diastolic pressure.  Recommendations:  I requested an echocardiogram. Recommend initial medical therapy. If the patient fails medical therapy, consider functional testing or pressure wire interrogation of the LAD  Lorine BearsMuhammad  Arida MD, [facility] Center[facility]  10/15/2013, 7:47 PM  Chest CT 10/2013 IMPRESSION:  No evidence for mediastinal or hilar lymphadenopathy.  Probable bilateral tiny calcified granulomata. As the 2 mm nodule in  the left lower lobe cannot be definitely seen to be calcified,  followup by consensus criteria is suggested. If the patient is at  high risk for bronchogenic carcinoma, follow-up chest CT at 1 year  is recommended. If the patient is at low risk, no follow-up is  needed. This recommendation follows the consensus statement:  Guidelines for Management of Small Pulmonary Nodules Detected on CT  Scans: A Statement from the Fleischner Society as published in  Radiology 2005; 237:395-400.  Electronically Signed  By: Kennith CenterEric Mansell M.D.  On: 11/03/2013 12:06  CARDIAC CT IMPRESSION: 1. The appearance of the lungs suggests mild interstitial pulmonary edema. Given the presence of bilateral pleural effusions, findings suggest congestive heart failure. 2. Prominent lymphoid tissue in the hilar regions bilaterally is nonspecific. In the setting of potential congestive failure, this may be related to edema and lymphatic congestion, however, clinical correlation for signs and symptoms of the lymphoproliferative disorder or other malignancy is suggested.  Electronically Signed By: Trudie Reedaniel Entrikin M.D. On: 10/16/2013 18:18  IMPRESSION: 1. Coronary calcium score of 144. This was 3996 percentile for age and sex matched control.  2. Normal origin or coronary arteries, right dominance.  3. One vessel coronary artery disease with significant lesions in the mid and possibly proximal LAD. Cardiac catheterization is recommended.  Tobias AlexanderKatarina Alexandria Current   TTE: 06/28/2016 Impression:  1. Left ventricular systolic function is normal.  2. Visually estimated ejection fraction is 70%.   3. The left  ventricular cavity size is normal by M-Mode.  4. Mild aortic regurgitation.   5. Aortic valve is sclerotic.   6. There were no stress-induced wall motion abnormalities. This is a  negative stress echo test for ischemia at 95 % PMHR.   7. Mild degree of aortic stenosis is present with peak gradient of 27.6   mmHg, mean gradient of 17.0 mmHg and a calculated valve area of 1.95 cm?.  8. Mild tricuspid regurgitation.  9. With an assumed right atrial pressure of 3 mmHg, the estimated right   ventricular systolic pressure is mildly elevated at 37.1 mmHg.   10. The left atrium is mildly dilated by M-Mode.      Assessment / Plan:  1. CAD - moderate LAD disease on a cath  in 12/2012, to manage medically - her initial symptoms of chest pain and dyspnea seem to be resolved.  The patient had repeat echocardiogram in November 2017 in [place] that was negative for any ischemia.  2. Valvular abnormalities including mild aortic stenosis with mean gradient of 17 mmHg, mild aortic regurgitation and mild tricuspid regurgitation.  3. Chronic diastolic HF - compensated. Continue with current regimen.   4. Abnormal cardiac CT - lymphoid tissue or coronary CT however a followup CT chest showed only 2 mm lung nodule. No lymphadenopathy inhaler or mediastinal region. No mass in the neck area. Because the patient has never been a smoker we will repeat a chest CT now.  5. Mild AS - as above her aortic stenosis hasn't changed within the last year.  6. Hyperlipidemia, all lipids at goal in January 2017, we'll decrease atorvastatin from 80-40 mg and followed labs at the end of this months.  7. Hypertension, well controlled, will cut down losartan hydrochlorothiazide to  50/12.5. She is advised to measure her blood pressure and call us.  CMP and lipids by her primary care physician at [facility] at the end of this month and to be faxed to [place], follow-up in 6 months.   Tobias Alexander 08/14/2016

## 2016-08-16 ENCOUNTER — Ambulatory Visit: Payer: Medicare Other | Admitting: Cardiology

## 2016-08-17 ENCOUNTER — Encounter: Payer: Self-pay | Admitting: Obstetrics and Gynecology

## 2016-08-17 ENCOUNTER — Other Ambulatory Visit: Payer: Self-pay | Admitting: Family Medicine

## 2016-08-17 ENCOUNTER — Ambulatory Visit (INDEPENDENT_AMBULATORY_CARE_PROVIDER_SITE_OTHER): Payer: Medicare Other | Admitting: Obstetrics and Gynecology

## 2016-08-17 VITALS — BP 128/70 | HR 80 | Resp 16 | Ht 61.75 in | Wt 164.0 lb

## 2016-08-17 DIAGNOSIS — Z01419 Encounter for gynecological examination (general) (routine) without abnormal findings: Secondary | ICD-10-CM

## 2016-08-17 DIAGNOSIS — Z124 Encounter for screening for malignant neoplasm of cervix: Secondary | ICD-10-CM

## 2016-08-17 DIAGNOSIS — Z1231 Encounter for screening mammogram for malignant neoplasm of breast: Secondary | ICD-10-CM

## 2016-08-17 NOTE — Progress Notes (Signed)
66 y.o. Kerry Perez MarriedCaucasianF here for annual exam.  No vaginal bleeding. Not really sexually active, fine with it. Planning to move to [place] to be near her Daughter.     Patient's last menstrual period was 08/07/1998.          Sexually active: No.  The current method of family planning is post menopausal status.    Exercising: Yes.    walking, clogging Smoker:  no  Health Maintenance: Pap:  04-28-13 WNL NEG HPV History of abnormal Pap:  no MMG:  08/06/15 BIRADS 1 negative Colonoscopy:  Around 2009 WNL per patient BMD:  2017 with PCP, osteopenia TDaP:  2010 per patient Gardasil: n/a Labs and immunizations with primary   reports that she has never smoked. She has never used smokeless tobacco. She reports that she drinks about 2.4 - 3.0 oz of alcohol per week . She reports that she does not use drugs.2 kids (Sons) in [place] and 1 in [place] (Daughter). Daughter is in [place], getting married.  She has 2 grandchildren with her middle son in [place] (3 and 6 months). She and her husband are retired. Considering moving to [place].   Past Medical History:  Diagnosis Date  . Abnormal cardiac CT angiography    a. Ancillary findings: prominent hilar lymphoid tissue nonspecific - possibly related to CHF and lymphatic congestion, clinical correlation for signs/sx of ymphoproliferative disorder or other malignancy is suggested - denied constitutional sx and will f/u with PCP.  Marland Kitchen Anemia   . Aortic stenosis    a. Mild by echo 10/2013.  Marland Kitchen Arthritis    "left knee"   . CAD (coronary artery disease)    a. Moderate LAD lesion by cath 10/15/13 with nonischemic nuclear stress test 10/17/13  . Diastolic CHF (HCC)    a. Dx 10/2013 - normal EF but elevated LVEDP by cath.  . Diet-controlled diabetes mellitus (HCC)   . GERD (gastroesophageal reflux disease)    mild  . Heart failure (HCC)   . Hypercholesterolemia   . Hypertension     Past Surgical History:  Procedure Laterality Date  .  APPENDECTOMY  1965  . BREAST BIOPSY Right ~ 2000; ~ 2002"   "both benign"  . KNEE ARTHROSCOPY Left 06/18/2013   Procedure: LEFT KNEE ARTHROSCOPY WITH DEBRIDEMENT;  Surgeon: Loanne Drilling, MD;  Location: [facility];  Service: Orthopedics;  Laterality: Left;  . LEFT HEART CATHETERIZATION WITH CORONARY ANGIOGRAM N/A 10/15/2013   Procedure: LEFT HEART CATHETERIZATION WITH CORONARY ANGIOGRAM;  Surgeon: Iran Ouch, MD;  Location: [facility];  Service: Cardiovascular;  Laterality: N/A;  . STOMACH SURGERY  1965   Hairball removed from stomach    Current Outpatient Prescriptions  Medication Sig Dispense Refill  . acetaminophen (TYLENOL) 325 MG tablet Take 650 mg by mouth as needed.    Marland Kitchen aspirin 81 MG tablet Take 81 mg by mouth daily.    Marland Kitchen atorvastatin (LIPITOR) 40 MG tablet Take 1 tablet (40 mg total) by mouth daily. 90 tablet 3  . calcium-vitamin D (OSCAL WITH D) 500-200 MG-UNIT per tablet Take 1 tablet by mouth 2 (two) times daily.    . Cholecalciferol (VITAMIN D) 2000 UNITS CAPS Take 1 capsule by mouth daily.     . furosemide (LASIX) 40 MG tablet Take 1 tablet (40 mg total) by mouth daily as needed (shortness of breath, fluid weight gain or fluid retention). 30 tablet 11  . losartan-hydrochlorothiazide (HYZAAR) 50-12.5 MG tablet Take 1 tablet by mouth daily. 90  tablet 3  . metoprolol succinate (TOPROL-XL) 25 MG 24 hr tablet TAKE 1 TABLET (25 MG TOTAL) BY MOUTH DAILY. 90 tablet 3  . Multiple Vitamins-Minerals (MULTIVITAMIN PO) Take 1 tablet by mouth daily.     . nitroGLYCERIN (NITROSTAT) 0.4 MG SL tablet Place 1 tablet (0.4 mg total) under the tongue every 5 (five) minutes as needed for chest pain (up to 3 doses). 25 tablet 4   No current facility-administered medications for this visit.     Family History  Problem Relation Age of Onset  . Hypertension Mother   . Heart disease Mother   . Osteoporosis Sister     Review of Systems  Constitutional: Negative.   HENT: Negative.   Eyes:  Negative.   Respiratory: Negative.   Cardiovascular: Negative.   Gastrointestinal: Negative.   Endocrine: Negative.   Genitourinary: Negative.   Musculoskeletal: Negative.   Skin: Negative.   Allergic/Immunologic: Negative.   Neurological: Negative.   Hematological: Negative.   Psychiatric/Behavioral: Negative.     Exam:   BP 128/70 (BP Location: Right Arm, Patient Position: Sitting, Cuff Size: Normal)   Pulse 80   Resp 16   Ht 5' 1.75" (1.568 m)   Wt 164 lb (74.4 kg)   LMP 08/07/1998   BMI 30.24 kg/m   Weight change: @WEIGHTCHANGE @ Height:   Height: 5' 1.75" (156.8 cm)  Ht Readings from Last 3 Encounters:  08/17/16 5' 1.75" (1.568 m)  08/12/15 5\' 2"  (1.575 m)  06/10/15 5' 1.75" (1.568 m)    General appearance: alert, cooperative and appears stated age Head: Normocephalic, without obvious abnormality, atraumatic Neck: no adenopathy, supple, symmetrical, trachea midline and thyroid normal to inspection and palpation Lungs: clear to auscultation bilaterally Cardiovascular: regular rate and rhythm Breasts: normal appearance, no masses or tenderness Heart: regular rate and rhythm Abdomen: soft, non-tender; bowel sounds normal; no masses,  no organomegaly Extremities: extremities normal, atraumatic, no cyanosis or edema Skin: Skin color, texture, turgor normal. No rashes or lesions Lymph nodes: Cervical, supraclavicular, and axillary nodes normal. No abnormal inguinal nodes palpated Neurologic: Grossly normal   Pelvic: External genitalia:  no lesions              Urethra:  normal appearing urethra with no masses, tenderness or lesions              Bartholins and Skenes: normal                 Vagina: normal appearing atrophic vagina with normal color and discharge, no lesions              Cervix: no lesions               Bimanual Exam:  Uterus:  normal size, contour, position, consistency, mobility, non-tender              Adnexa: no mass, fullness, tenderness                Rectovaginal: Confirms               Anus:  normal sphincter tone, no lesions  Chaperone was present for exam.  A:  Well Woman with normal exam  P:   Pap with reflex hpv  Mammogram scheduled  Labs and immunizations with primary MD  Discussed breast self exam  Discussed calcium and vit D intake  Colonoscopy is UTD

## 2016-08-17 NOTE — Patient Instructions (Signed)

## 2016-08-21 LAB — IPS PAP TEST WITH REFLEX TO HPV

## 2016-08-29 ENCOUNTER — Other Ambulatory Visit: Payer: Self-pay | Admitting: Cardiology

## 2016-08-29 DIAGNOSIS — I5031 Acute diastolic (congestive) heart failure: Secondary | ICD-10-CM

## 2016-08-29 DIAGNOSIS — I10 Essential (primary) hypertension: Secondary | ICD-10-CM

## 2016-08-29 DIAGNOSIS — I350 Nonrheumatic aortic (valve) stenosis: Secondary | ICD-10-CM

## 2016-08-29 DIAGNOSIS — E7800 Pure hypercholesterolemia, unspecified: Secondary | ICD-10-CM

## 2016-08-31 DIAGNOSIS — I10 Essential (primary) hypertension: Secondary | ICD-10-CM | POA: Diagnosis not present

## 2016-08-31 DIAGNOSIS — I2510 Atherosclerotic heart disease of native coronary artery without angina pectoris: Secondary | ICD-10-CM | POA: Diagnosis not present

## 2016-08-31 DIAGNOSIS — M720 Palmar fascial fibromatosis [Dupuytren]: Secondary | ICD-10-CM | POA: Diagnosis not present

## 2016-08-31 DIAGNOSIS — E119 Type 2 diabetes mellitus without complications: Secondary | ICD-10-CM | POA: Diagnosis not present

## 2016-08-31 DIAGNOSIS — M810 Age-related osteoporosis without current pathological fracture: Secondary | ICD-10-CM | POA: Diagnosis not present

## 2016-08-31 DIAGNOSIS — E7800 Pure hypercholesterolemia, unspecified: Secondary | ICD-10-CM | POA: Diagnosis not present

## 2016-08-31 DIAGNOSIS — Z1159 Encounter for screening for other viral diseases: Secondary | ICD-10-CM | POA: Diagnosis not present

## 2016-08-31 DIAGNOSIS — I209 Angina pectoris, unspecified: Secondary | ICD-10-CM | POA: Diagnosis not present

## 2016-08-31 DIAGNOSIS — Z0000 Encounter for general adult medical examination without abnormal findings: Secondary | ICD-10-CM | POA: Diagnosis not present

## 2016-08-31 DIAGNOSIS — E669 Obesity, unspecified: Secondary | ICD-10-CM | POA: Diagnosis not present

## 2016-09-27 ENCOUNTER — Ambulatory Visit
Admission: RE | Admit: 2016-09-27 | Discharge: 2016-09-27 | Disposition: A | Discharge status: Home / Self Care | Payer: Medicare Other | Source: Ambulatory Visit | Attending: Family Medicine | Admitting: Family Medicine

## 2016-09-27 DIAGNOSIS — Z1231 Encounter for screening mammogram for malignant neoplasm of breast: Secondary | ICD-10-CM

## 2016-12-22 DIAGNOSIS — M1711 Unilateral primary osteoarthritis, right knee: Secondary | ICD-10-CM | POA: Diagnosis not present

## 2016-12-22 DIAGNOSIS — M1712 Unilateral primary osteoarthritis, left knee: Secondary | ICD-10-CM | POA: Diagnosis not present

## 2016-12-22 DIAGNOSIS — M170 Bilateral primary osteoarthritis of knee: Secondary | ICD-10-CM | POA: Diagnosis not present

## 2017-03-01 DIAGNOSIS — I5032 Chronic diastolic (congestive) heart failure: Secondary | ICD-10-CM | POA: Diagnosis not present

## 2017-03-01 DIAGNOSIS — E119 Type 2 diabetes mellitus without complications: Secondary | ICD-10-CM | POA: Diagnosis not present

## 2017-03-01 DIAGNOSIS — I10 Essential (primary) hypertension: Secondary | ICD-10-CM | POA: Diagnosis not present

## 2017-03-01 DIAGNOSIS — I350 Nonrheumatic aortic (valve) stenosis: Secondary | ICD-10-CM | POA: Diagnosis not present

## 2017-03-01 DIAGNOSIS — I2510 Atherosclerotic heart disease of native coronary artery without angina pectoris: Secondary | ICD-10-CM | POA: Diagnosis not present

## 2017-03-01 DIAGNOSIS — E7800 Pure hypercholesterolemia, unspecified: Secondary | ICD-10-CM | POA: Diagnosis not present

## 2017-03-21 ENCOUNTER — Encounter: Payer: Self-pay | Admitting: Cardiology

## 2017-04-11 ENCOUNTER — Encounter: Payer: Self-pay | Admitting: Cardiology

## 2017-04-11 ENCOUNTER — Ambulatory Visit (INDEPENDENT_AMBULATORY_CARE_PROVIDER_SITE_OTHER): Payer: Medicare Other | Admitting: Cardiology

## 2017-04-11 ENCOUNTER — Other Ambulatory Visit: Payer: Self-pay

## 2017-04-11 ENCOUNTER — Ambulatory Visit (HOSPITAL_COMMUNITY): Payer: Medicare Other | Attending: Cardiology

## 2017-04-11 VITALS — BP 124/64 | HR 55 | Ht 61.75 in | Wt 165.0 lb

## 2017-04-11 DIAGNOSIS — E785 Hyperlipidemia, unspecified: Secondary | ICD-10-CM | POA: Diagnosis not present

## 2017-04-11 DIAGNOSIS — I509 Heart failure, unspecified: Secondary | ICD-10-CM | POA: Insufficient documentation

## 2017-04-11 DIAGNOSIS — I350 Nonrheumatic aortic (valve) stenosis: Secondary | ICD-10-CM

## 2017-04-11 DIAGNOSIS — Z8249 Family history of ischemic heart disease and other diseases of the circulatory system: Secondary | ICD-10-CM | POA: Diagnosis not present

## 2017-04-11 DIAGNOSIS — I110 Hypertensive heart disease with heart failure: Secondary | ICD-10-CM | POA: Diagnosis not present

## 2017-04-11 DIAGNOSIS — I2583 Coronary atherosclerosis due to lipid rich plaque: Secondary | ICD-10-CM

## 2017-04-11 DIAGNOSIS — I2510 Atherosclerotic heart disease of native coronary artery without angina pectoris: Secondary | ICD-10-CM | POA: Insufficient documentation

## 2017-04-11 DIAGNOSIS — R079 Chest pain, unspecified: Secondary | ICD-10-CM | POA: Diagnosis not present

## 2017-04-11 DIAGNOSIS — E119 Type 2 diabetes mellitus without complications: Secondary | ICD-10-CM | POA: Diagnosis not present

## 2017-04-11 DIAGNOSIS — I10 Essential (primary) hypertension: Secondary | ICD-10-CM | POA: Diagnosis not present

## 2017-04-11 DIAGNOSIS — I358 Other nonrheumatic aortic valve disorders: Secondary | ICD-10-CM | POA: Insufficient documentation

## 2017-04-11 LAB — ECHOCARDIOGRAM COMPLETE
Height: 61.75 in
Weight: 2640 oz

## 2017-04-11 NOTE — Patient Instructions (Signed)
Medication Instructions:   Your physician recommends that you continue on your current medications as directed. Please refer to the Current Medication list given to you today.     Testing/Procedures:  Your physician has requested that you have an echocardiogram. Echocardiography is a painless test that uses sound waves to create images of your heart. It provides your doctor with information about the size and shape of your heart and how well your heart's chambers and valves are working. This procedure takes approximately one hour. There are no restrictions for this procedure.   CORONARY CTA WITH FFR---FOR DR Delton SeeNELSON TO READ     Follow-Up:  Your physician wants you to follow-up in: 6 MONTHS WITH DR Johnell ComingsNELSON You will receive a reminder letter in the mail two months in advance. If you don't receive a letter, please call our office to schedule the follow-up appointment.      If you need a refill on your cardiac medications before your next appointment, please call your pharmacy.

## 2017-04-11 NOTE — Progress Notes (Signed)
Patient ID: Kerry Perez, female   DOB: 07/27/1951, 65 y.o.   MRN: [id number]    Chief complain: follow up for CAD, chest pain with congestion  History of Present Illness: Kerry Perez is a 65 year old female with a history of HTN, HLD, diet controlled DM, OA, GERD, positive family history for CAD who was recently evaluated in the office for chest pain and dyspnea. Cardiac Ct showed calcium score of 144 and one vessel CAD in the LAD.   She was then referred for cardiac cath - this showed a moderate LAD lesion with a nonischemic nuclear stress test. She will be managed medically.  Does have diastolic dysfunction - EF is normal at 60 to 65%. Her statin was increased. She was also noted to have mild AS by echo and normocytic anemia on her labs. The cardiac CT also showed some prominent lymphoid tissue - no constitutional symptoms - asked to follow up with PCP.  08/12/2015- the patient is coming after 1 year, she is doing great, walking daily, following her steps, always > 10.000, no DOE, no chest pain other than in the last week when she has been dealing with cold. No LE edema, palpitations, syncope, no orthopnea, PND. Complaint with meds, no muscle pain with atorvastatin.  08/14/2016 - the patient is coming after one year, in the meantime she has been exercising and eating healthy and feeling well. She has spent a lot of time in the [place] with her grandchildren and developed left sided chest pain with position changes and with deep inspiration. She was seen in the urgent care setting and by cardiologist in [place] and they concluded that this is related to pleurisy. She used ibuprofen with complete resolution of her chest pain. She has been compliant with her medicines and has no side effects and would like to cut down some.  04/11/2017 - six-month follow-up, the patient continues to walk everyday minimal 10,000 steps, she has noticed recently chest pressure with peak exercise that resolves  at rest. She is compliant with her medication and is tolerating that well. She denies any lower extremity edema orthopnea or proximal nocturnal dyspnea she is no palpitations or syncope.   Allergies  Allergen Reactions  . Lisinopril Other (See Comments)    Lips swelled   Past Medical History:  Diagnosis Date  . Abnormal cardiac CT angiography    a. Ancillary findings: prominent hilar lymphoid tissue nonspecific - possibly related to CHF and lymphatic congestion, clinical correlation for signs/sx of ymphoproliferative disorder or other malignancy is suggested - denied constitutional sx and will f/u with PCP.  . Anemia   . Aortic stenosis    a. Mild by echo 10/2013.  . Arthritis    "left knee"   . CAD (coronary artery disease)    a. Moderate LAD lesion by cath 10/15/13 with nonischemic nuclear stress test 10/17/13  . Diastolic CHF (HCC)    a. Dx 10/2013 - normal EF but elevated LVEDP by cath.  . Diet-controlled diabetes mellitus (HCC)   . GERD (gastroesophageal reflux disease)    mild  . Heart failure (HCC)   . Hypercholesterolemia   . Hypertension     Past Surgical History:  Procedure Laterality Date  . APPENDECTOMY  1965  . BREAST BIOPSY Right ~ 2000; ~ 2002"   "both benign"  . KNEE ARTHROSCOPY Left 06/18/2013   Procedure: LEFT KNEE ARTHROSCOPY WITH DEBRIDEMENT;  Surgeon: Frank V Aluisio, MD;  Location: [facility];  Service: Orthopedics;  Laterality:   Left;  . LEFT HEART CATHETERIZATION WITH CORONARY ANGIOGRAM N/A 10/15/2013   Procedure: LEFT HEART CATHETERIZATION WITH CORONARY ANGIOGRAM;  Surgeon: Muhammad A Arida, MD;  Location: [facility];  Service: Cardiovascular;  Laterality: N/A;  . STOMACH SURGERY  1965   Hairball removed from stomach    History  Smoking Status  . Never Smoker  Smokeless Tobacco  . Never Used    History  Alcohol Use  . 2.4 - 3.0 oz/week  . 4 - 5 Glasses of wine per week    Family History  Problem Relation Age of Onset  . Hypertension Mother   .  Heart disease Mother   . Osteoporosis Sister     Review of Systems: The review of systems is per the HPI.  All other systems were reviewed and are negative.  Physical Exam: BP 124/64   Pulse (!) 55   Ht 5' 1.75" (1.568 m)   Wt 165 lb (74.8 kg)   LMP 08/07/1998   BMI 30.42 kg/m  Patient is very pleasant and in no acute distress. [place] accent. She is obese. Skin is warm and dry. Color is normal.  HEENT is unremarkable. Normocephalic/atraumatic. PERRL. Sclera are nonicteric. Neck is supple. No masses. No JVD. Lungs are clear. Cardiac exam shows a regular rate and rhythm. She has a 3/6 systolic murmur,  Abdomen is soft. Extremities are without edema. Gait and ROM are intact. No gross neurologic deficits noted.  Wt Readings from Last 3 Encounters:  04/11/17 165 lb (74.8 kg)  08/17/16 164 lb (74.4 kg)  08/12/15 164 lb (74.4 kg)    LABORATORY DATA: EKG with sinus brady - unchanged. Reviewed with Dr. Shanetta Nicolls  Lab Results  Component Value Date   WBC 5.2 08/13/2015   HGB 12.1 08/13/2015   HCT 36.3 08/13/2015   PLT 317 08/13/2015   GLUCOSE 99 08/13/2015   CHOL 156 08/13/2015   TRIG 56 08/13/2015   HDL 87 08/13/2015   LDLCALC 58 08/13/2015   ALT 16 08/13/2015   AST 24 08/13/2015   NA 136 08/13/2015   K 3.6 08/13/2015   CL 98 08/13/2015   CREATININE 0.81 08/13/2015   BUN 20 08/13/2015   CO2 30 08/13/2015   TSH 1.734 08/13/2015   INR 0.96 10/15/2013   Echo Study Conclusions  - Left ventricle: The cavity size was normal. Wall thickness was increased in a pattern of mild LVH. Systolic function was normal. The estimated ejection fraction was in the range of 60% to 65%. Wall motion was normal; there were no regional wall motion abnormalities. Left ventricular diastolic function parameters were normal. - Aortic valve: There was very mild stenosis. Mild regurgitation. Valve area: 1.73cm^2(VTI). Valve area: 1.67cm^2 (Vmax). - Right atrium: The atrium was mildly  dilated.  Coronary angiography:  Coronary dominance: right  Left Main: normal  Left Anterior Descending (LAD): Normal in size and mildly calcified. There is a 50-60% discrete stenosis after the origin of second diagonal. The rest of the midsegment has minor irregularities. There is mild myocardial bridging noted in the midsegment.  1st diagonal (D1): Normal in size with 30% ostial stenosis.  2nd diagonal (D2): Normal in size with 20% ostial stenosis.  3rd diagonal (D3): Small in size with minor irregularities.  Circumflex (LCx): Normal in size and nondominant. The vessel has minor irregularities.  1st obtuse marginal: Very small in size.  2nd obtuse marginal: Normal in size with no significant disease.  3rd obtuse marginal: Normal in size with minor irregularities.    Right Coronary Artery: normal in size and dominant. The stent percent proximal stenosis and 20% mid stenosis.  Posterior descending artery: normal in size with no significant disease.  Posterior AV segment: normal in size with no significant disease.  Posterolateral branchs: No significant disease. Left ventriculography: Left ventricular systolic function is normal , LVEF is estimated at 60 %, there is no significant mitral regurgitation  Final Conclusions:  1. Moderate mid LAD stenosis with mild mid myocardial bridging.  2.Normal LV systolic function with mildly elevated left ventricular end-diastolic pressure.  Recommendations:  I requested an echocardiogram. Recommend initial medical therapy. If the patient fails medical therapy, consider functional testing or pressure wire interrogation of the LAD  Muhammad Arida MD, FACC  10/15/2013, 7:47 PM  Chest CT 10/2013 IMPRESSION:  No evidence for mediastinal or hilar lymphadenopathy.  Probable bilateral tiny calcified granulomata. As the 2 mm nodule in  the left lower lobe cannot be definitely seen to be calcified,  followup by consensus criteria is suggested. If the patient is  at  high risk for bronchogenic carcinoma, follow-up chest CT at 1 year  is recommended. If the patient is at low risk, no follow-up is  needed. This recommendation follows the consensus statement:  Guidelines for Management of Small Pulmonary Nodules Detected on CT  Scans: A Statement from the Fleischner Society as published in  Radiology 2005; 237:395-400.  Electronically Signed  By: Eric Mansell M.D.  On: 11/03/2013 12:06  CARDIAC CT IMPRESSION: 1. The appearance of the lungs suggests mild interstitial pulmonary edema. Given the presence of bilateral pleural effusions, findings suggest congestive heart failure. 2. Prominent lymphoid tissue in the hilar regions bilaterally is nonspecific. In the setting of potential congestive failure, this may be related to edema and lymphatic congestion, however, clinical correlation for signs and symptoms of the lymphoproliferative disorder or other malignancy is suggested.  Electronically Signed By: Daniel Entrikin M.D. On: 10/16/2013 18:18  IMPRESSION: 1. Coronary calcium score of 144. This was 96 percentile for age and sex matched control.  2. Normal origin or coronary arteries, right dominance.  3. One vessel coronary artery disease with significant lesions in the mid and possibly proximal LAD. Cardiac catheterization is recommended.  Kellar Westberg   TTE: 06/28/2016 Impression:  1. Left ventricular systolic function is normal.  2. Visually estimated ejection fraction is 70%.   3. The left ventricular cavity size is normal by M-Mode.  4. Mild aortic regurgitation.   5. Aortic valve is sclerotic.   6. There were no stress-induced wall motion abnormalities. This is a  negative stress echo test for  ischemia at 95 % PMHR.   7. Mild degree of aortic stenosis is present with peak gradient of 27.6   mmHg, mean gradient of 17.0 mmHg and a calculated valve area of 1.95 cm?.  8. Mild tricuspid regurgitation.  9. With an assumed right atrial pressure of 3 mmHg, the estimated right   ventricular systolic pressure is mildly elevated at 37.1 mmHg.   10. The left atrium is mildly dilated by M-Mode.    EKG performed today 04/11/2017 was personally reviewed shows sinus bradycardia otherwise normal EKG and unchanged from prior.    Assessment / Plan:  1. CAD - moderate LAD disease on a cath in 12/2012, we have been managing medically for the last 4 years however worsening chest pressure on exertion we will perform coronary CTA with CTS S4 as her lesion was borderline last time in invasive FFR was not performed.   2.   Mild aortic stenosis and aortic regurgitation on echocardiogram in 2015, we will repeat echocardiogram now.  Mild aortic stenosis with mean gradient of 17 mmHg.  3. Chronic diastolic HF - compensated. Continue with current regimen. Lasix when necessary she hasn't used it in years.  4. Abnormal cardiac CT - lymphoid tissue or coronary CT however a followup CT chest showed only 2 mm lung nodule. No lymphadenopathy inhaler or mediastinal region. No mass in the neck area. Repeat chest CT showed stable lesion and doesn't need to be repeated for this indication.   5. Hyperlipidemia, all lipids repeated by her primary care physician in July of this year old lipids at goal. Normal LFTs. Continue atorvastatin 40 mg.  6. Hypertension, well controlled.  Follow up in 6 months.  Infantof Villagomez 04/11/2017   

## 2017-04-12 NOTE — Addendum Note (Signed)
Addended by: Micki RileySHOFFNER, Skylar Flynt C on: 04/12/2017 04:44 PM   Modules accepted: Orders

## 2017-04-16 ENCOUNTER — Encounter: Payer: Self-pay | Admitting: Cardiology

## 2017-04-23 ENCOUNTER — Ambulatory Visit (HOSPITAL_COMMUNITY)
Admission: RE | Admit: 2017-04-23 | Discharge: 2017-04-23 | Disposition: A | Discharge status: Home / Self Care | Payer: Medicare Other | Source: Ambulatory Visit | Attending: Cardiology | Admitting: Cardiology

## 2017-04-23 DIAGNOSIS — I2583 Coronary atherosclerosis due to lipid rich plaque: Secondary | ICD-10-CM | POA: Diagnosis not present

## 2017-04-23 DIAGNOSIS — I350 Nonrheumatic aortic (valve) stenosis: Secondary | ICD-10-CM | POA: Diagnosis not present

## 2017-04-23 DIAGNOSIS — R0789 Other chest pain: Secondary | ICD-10-CM | POA: Diagnosis not present

## 2017-04-23 DIAGNOSIS — E785 Hyperlipidemia, unspecified: Secondary | ICD-10-CM | POA: Diagnosis not present

## 2017-04-23 DIAGNOSIS — I2510 Atherosclerotic heart disease of native coronary artery without angina pectoris: Secondary | ICD-10-CM

## 2017-04-23 DIAGNOSIS — I10 Essential (primary) hypertension: Secondary | ICD-10-CM | POA: Diagnosis not present

## 2017-04-23 DIAGNOSIS — R079 Chest pain, unspecified: Secondary | ICD-10-CM

## 2017-04-23 DIAGNOSIS — I700 Atherosclerosis of aorta: Secondary | ICD-10-CM | POA: Insufficient documentation

## 2017-04-23 MED ORDER — NITROGLYCERIN 0.4 MG SL SUBL
0.8000 mg | SUBLINGUAL_TABLET | Freq: Once | SUBLINGUAL | Status: AC
  Administered 2017-04-23: 0.8 mg via SUBLINGUAL
  Filled 2017-04-23: qty 25

## 2017-04-23 MED ORDER — NITROGLYCERIN 0.4 MG SL SUBL
SUBLINGUAL_TABLET | SUBLINGUAL | Status: AC
  Filled 2017-04-23: qty 2

## 2017-04-23 MED ORDER — IOPAMIDOL (ISOVUE-370) INJECTION 76%
INTRAVENOUS | Status: AC
  Filled 2017-04-23: qty 100

## 2017-04-23 MED ORDER — IOPAMIDOL (ISOVUE-370) INJECTION 76%
INTRAVENOUS | Status: AC
  Administered 2017-04-23: 100 mL
  Filled 2017-04-23: qty 100

## 2017-04-23 NOTE — Progress Notes (Signed)
CT scan completed. Tolerated well. D/c home walking, awake and alert. In no distress 

## 2017-04-24 ENCOUNTER — Encounter: Payer: Self-pay | Admitting: *Deleted

## 2017-04-24 ENCOUNTER — Telehealth: Payer: Self-pay | Admitting: *Deleted

## 2017-04-24 DIAGNOSIS — R079 Chest pain, unspecified: Secondary | ICD-10-CM

## 2017-04-24 DIAGNOSIS — R931 Abnormal findings on diagnostic imaging of heart and coronary circulation: Secondary | ICD-10-CM | POA: Insufficient documentation

## 2017-04-24 DIAGNOSIS — Z01812 Encounter for preprocedural laboratory examination: Secondary | ICD-10-CM

## 2017-04-24 MED ORDER — EZETIMIBE 10 MG PO TABS: 10.0000 mg | ORAL_TABLET | Freq: Every day | ORAL | 2 refills | Status: DC

## 2017-04-24 MED ORDER — ATORVASTATIN CALCIUM 80 MG PO TABS: 80.0000 mg | ORAL_TABLET | Freq: Every day | ORAL | 2 refills | Status: DC

## 2017-04-24 NOTE — Telephone Encounter (Signed)
-----   Message from Lars Masson, MD sent at 04/24/2017  1:30 PM EDT ----- [name], Please schedule Kerry Perez for a left heart cath this Friday 9/21, it has to be Dr Clifton James. Call Kerry Lapid with the details, call me if you have any questions.  Also please add zetia 10 mg po daily to her regimen and change lipitor to 80 - she is already getting 80 but it says 40 in our medications. K

## 2017-04-24 NOTE — Telephone Encounter (Signed)
Scheduled the pts left cardiac cath for next Thursday 9/27 at 1030 for Dr Clifton James to do. Dr. Rico Sheehan this date and time. Scheduled the pts pre-cath labs for next Monday 9/24 at our office, to check a bmet, cbc w diff, and PT/INR.  Informed the pt that she must arrive at [facility] short stay at 0800 the morning of her cath.  Informed the pt that I will have her cath instructions available at the front desk to pick up, at her lab appt for next Monday 9/24.  Briefly went over cath instructions with the pt over the phone.  Confirmed the pharmacy of choice with the pt to send Zetia and atorvastatin too.  Informed the pt that Dr Delton See wants her to start taking zetia 10 mg po daily, and she should continue taking atorvastatin 80 mg po daily.  Pt verbalized understanding and agrees with this plan.  Will send pre-cert and Dr Delton See a message to place orders.

## 2017-04-30 ENCOUNTER — Other Ambulatory Visit: Payer: Medicare Other | Admitting: *Deleted

## 2017-04-30 DIAGNOSIS — R079 Chest pain, unspecified: Secondary | ICD-10-CM | POA: Diagnosis not present

## 2017-04-30 DIAGNOSIS — R931 Abnormal findings on diagnostic imaging of heart and coronary circulation: Secondary | ICD-10-CM | POA: Diagnosis not present

## 2017-04-30 DIAGNOSIS — Z01812 Encounter for preprocedural laboratory examination: Secondary | ICD-10-CM | POA: Diagnosis not present

## 2017-04-30 LAB — CBC WITH DIFFERENTIAL/PLATELET
Basophils Absolute: 0 10*3/uL (ref 0.0–0.2)
Basos: 1 %
EOS (ABSOLUTE): 0.2 10*3/uL (ref 0.0–0.4)
Eos: 5 %
Hematocrit: 36.1 % (ref 34.0–46.6)
Hemoglobin: 12 g/dL (ref 11.1–15.9)
Immature Grans (Abs): 0 10*3/uL (ref 0.0–0.1)
Immature Granulocytes: 0 %
Lymphocytes Absolute: 1.8 10*3/uL (ref 0.7–3.1)
Lymphs: 38 %
MCH: 30.7 pg (ref 26.6–33.0)
MCHC: 33.2 g/dL (ref 31.5–35.7)
MCV: 92 fL (ref 79–97)
Monocytes Absolute: 0.7 10*3/uL (ref 0.1–0.9)
Monocytes: 15 %
Neutrophils Absolute: 2 10*3/uL (ref 1.4–7.0)
Neutrophils: 41 %
Platelets: 237 10*3/uL (ref 150–379)
RBC: 3.91 x10E6/uL (ref 3.77–5.28)
RDW: 13.2 % (ref 12.3–15.4)
WBC: 4.8 10*3/uL (ref 3.4–10.8)

## 2017-04-30 LAB — BASIC METABOLIC PANEL
BUN/Creatinine Ratio: 28 (ref 12–28)
BUN: 23 mg/dL (ref 8–27)
CO2: 26 mmol/L (ref 20–29)
Calcium: 9.4 mg/dL (ref 8.7–10.3)
Chloride: 96 mmol/L (ref 96–106)
Creatinine, Ser: 0.81 mg/dL (ref 0.57–1.00)
GFR calc Af Amer: 88 mL/min/{1.73_m2} (ref >59)
GFR calc non Af Amer: 76 mL/min/{1.73_m2} (ref >59)
Glucose: 104 mg/dL — ABNORMAL HIGH (ref 65–99)
Potassium: 4 mmol/L (ref 3.5–5.2)
Sodium: 137 mmol/L (ref 134–144)

## 2017-04-30 LAB — PROTIME-INR
INR: 1 (ref 0.8–1.2)
Prothrombin Time: 10.4 s (ref 9.1–12.0)

## 2017-05-02 ENCOUNTER — Telehealth: Payer: Self-pay

## 2017-05-02 NOTE — Telephone Encounter (Signed)
Left detailed message per DPR  Patient contacted pre-catheterization at [facility] scheduled for:  05/03/2017 @ 1030 Verified arrival time and place:  [facility] @ 0800 Confirmed AM meds to be taken pre-cath with sip of water: Take ASA Hold Lasix and losartan/hctz Patient must have responsible person to drive home post procedure and observe patient for 24 hours Addl concerns:   Left this nurse name and # if any questions/concerns

## 2017-05-03 ENCOUNTER — Ambulatory Visit (HOSPITAL_COMMUNITY)
Admission: RE | Admit: 2017-05-03 | Discharge: 2017-05-04 | Disposition: A | Discharge status: Home / Self Care | Payer: Medicare Other | Source: Ambulatory Visit | Attending: Internal Medicine | Admitting: Internal Medicine

## 2017-05-03 ENCOUNTER — Encounter (HOSPITAL_COMMUNITY): Payer: Self-pay | Admitting: General Practice

## 2017-05-03 ENCOUNTER — Encounter (HOSPITAL_COMMUNITY)
Admission: RE | Disposition: A | Discharge status: Home / Self Care | Payer: Self-pay | Source: Ambulatory Visit | Attending: Internal Medicine

## 2017-05-03 DIAGNOSIS — I2584 Coronary atherosclerosis due to calcified coronary lesion: Secondary | ICD-10-CM | POA: Diagnosis not present

## 2017-05-03 DIAGNOSIS — I350 Nonrheumatic aortic (valve) stenosis: Secondary | ICD-10-CM | POA: Diagnosis not present

## 2017-05-03 DIAGNOSIS — E7800 Pure hypercholesterolemia, unspecified: Secondary | ICD-10-CM | POA: Diagnosis present

## 2017-05-03 DIAGNOSIS — I2089 Other forms of angina pectoris: Secondary | ICD-10-CM | POA: Diagnosis present

## 2017-05-03 DIAGNOSIS — I25118 Atherosclerotic heart disease of native coronary artery with other forms of angina pectoris: Secondary | ICD-10-CM | POA: Diagnosis not present

## 2017-05-03 DIAGNOSIS — I110 Hypertensive heart disease with heart failure: Secondary | ICD-10-CM | POA: Diagnosis not present

## 2017-05-03 DIAGNOSIS — I2510 Atherosclerotic heart disease of native coronary artery without angina pectoris: Secondary | ICD-10-CM | POA: Diagnosis present

## 2017-05-03 DIAGNOSIS — M1990 Unspecified osteoarthritis, unspecified site: Secondary | ICD-10-CM | POA: Diagnosis not present

## 2017-05-03 DIAGNOSIS — K219 Gastro-esophageal reflux disease without esophagitis: Secondary | ICD-10-CM | POA: Insufficient documentation

## 2017-05-03 DIAGNOSIS — E119 Type 2 diabetes mellitus without complications: Secondary | ICD-10-CM | POA: Insufficient documentation

## 2017-05-03 DIAGNOSIS — I208 Other forms of angina pectoris: Secondary | ICD-10-CM | POA: Diagnosis present

## 2017-05-03 DIAGNOSIS — Z95820 Peripheral vascular angioplasty status with implants and grafts: Secondary | ICD-10-CM

## 2017-05-03 DIAGNOSIS — R911 Solitary pulmonary nodule: Secondary | ICD-10-CM | POA: Diagnosis not present

## 2017-05-03 DIAGNOSIS — Z8249 Family history of ischemic heart disease and other diseases of the circulatory system: Secondary | ICD-10-CM | POA: Insufficient documentation

## 2017-05-03 DIAGNOSIS — Z955 Presence of coronary angioplasty implant and graft: Secondary | ICD-10-CM

## 2017-05-03 DIAGNOSIS — I5032 Chronic diastolic (congestive) heart failure: Secondary | ICD-10-CM | POA: Insufficient documentation

## 2017-05-03 DIAGNOSIS — E785 Hyperlipidemia, unspecified: Secondary | ICD-10-CM | POA: Diagnosis present

## 2017-05-03 DIAGNOSIS — Z23 Encounter for immunization: Secondary | ICD-10-CM | POA: Diagnosis not present

## 2017-05-03 DIAGNOSIS — I352 Nonrheumatic aortic (valve) stenosis with insufficiency: Secondary | ICD-10-CM | POA: Insufficient documentation

## 2017-05-03 DIAGNOSIS — R931 Abnormal findings on diagnostic imaging of heart and coronary circulation: Secondary | ICD-10-CM | POA: Diagnosis present

## 2017-05-03 HISTORY — DX: Type 2 diabetes mellitus without complications: E11.9

## 2017-05-03 HISTORY — PX: INTRAVASCULAR PRESSURE WIRE/FFR STUDY: CATH118243

## 2017-05-03 HISTORY — DX: Peripheral vascular angioplasty status with implants and grafts: Z95.820

## 2017-05-03 HISTORY — DX: Personal history of other medical treatment: Z92.89

## 2017-05-03 HISTORY — PX: CORONARY ANGIOPLASTY WITH STENT PLACEMENT: SHX49

## 2017-05-03 HISTORY — DX: Migraine, unspecified, not intractable, without status migrainosus: G43.909

## 2017-05-03 HISTORY — PX: LEFT HEART CATH AND CORONARY ANGIOGRAPHY: CATH118249

## 2017-05-03 LAB — GLUCOSE, CAPILLARY
GLUCOSE-CAPILLARY: 95 mg/dL (ref 65–99)
Glucose-Capillary: 96 mg/dL (ref 65–99)
Glucose-Capillary: 134 mg/dL — ABNORMAL HIGH (ref 65–99)

## 2017-05-03 LAB — POCT ACTIVATED CLOTTING TIME
ACTIVATED CLOTTING TIME: 224 s
ACTIVATED CLOTTING TIME: 279 s
ACTIVATED CLOTTING TIME: 285 s
Activated Clotting Time: 329 seconds

## 2017-05-03 SURGERY — LEFT HEART CATH AND CORONARY ANGIOGRAPHY
Anesthesia: LOCAL

## 2017-05-03 MED ORDER — SODIUM CHLORIDE 0.9% FLUSH: 3.0000 mL | INTRAVENOUS | Status: DC | PRN

## 2017-05-03 MED ORDER — ACETAMINOPHEN 325 MG PO TABS: 650.0000 mg | ORAL_TABLET | ORAL | Status: DC | PRN

## 2017-05-03 MED ORDER — INFLUENZA VAC SPLIT HIGH-DOSE 0.5 ML IM SUSY
0.5000 mL | PREFILLED_SYRINGE | INTRAMUSCULAR | Status: AC
  Administered 2017-05-04: 0.5 mL via INTRAMUSCULAR
  Filled 2017-05-03: qty 0.5

## 2017-05-03 MED ORDER — NITROGLYCERIN 1 MG/10 ML FOR IR/CATH LAB
INTRA_ARTERIAL | Status: DC | PRN
  Administered 2017-05-03: 200 ug via INTRACORONARY
  Administered 2017-05-03 (×2): 100 ug via INTRACORONARY

## 2017-05-03 MED ORDER — SODIUM CHLORIDE 0.9 % WEIGHT BASED INFUSION
3.0000 mL/kg/h | INTRAVENOUS | Status: DC
  Administered 2017-05-03: 3 mL/kg/h via INTRAVENOUS

## 2017-05-03 MED ORDER — CLOPIDOGREL BISULFATE 300 MG PO TABS
ORAL_TABLET | ORAL | Status: DC | PRN
  Administered 2017-05-03: 600 mg via ORAL

## 2017-05-03 MED ORDER — HEPARIN (PORCINE) IN NACL 2-0.9 UNIT/ML-% IJ SOLN
INTRAMUSCULAR | Status: AC | PRN
  Administered 2017-05-03: 1000 mL via INTRA_ARTERIAL

## 2017-05-03 MED ORDER — LOSARTAN POTASSIUM-HCTZ 100-25 MG PO TABS: 1.0000 | ORAL_TABLET | Freq: Every day | ORAL | Status: DC

## 2017-05-03 MED ORDER — HEPARIN SODIUM (PORCINE) 1000 UNIT/ML IJ SOLN
INTRAMUSCULAR | Status: AC
  Filled 2017-05-03: qty 1

## 2017-05-03 MED ORDER — VERAPAMIL HCL 2.5 MG/ML IV SOLN
INTRAVENOUS | Status: AC
  Filled 2017-05-03: qty 2

## 2017-05-03 MED ORDER — FENTANYL CITRATE (PF) 100 MCG/2ML IJ SOLN
INTRAMUSCULAR | Status: DC | PRN
  Administered 2017-05-03: 25 ug via INTRAVENOUS
  Administered 2017-05-03: 50 ug via INTRAVENOUS

## 2017-05-03 MED ORDER — HYDROCHLOROTHIAZIDE 25 MG PO TABS
25.0000 mg | ORAL_TABLET | Freq: Every day | ORAL | Status: DC
  Administered 2017-05-04: 25 mg via ORAL
  Filled 2017-05-03: qty 1

## 2017-05-03 MED ORDER — HEPARIN (PORCINE) IN NACL 2-0.9 UNIT/ML-% IJ SOLN
INTRAMUSCULAR | Status: AC
  Filled 2017-05-03: qty 1000

## 2017-05-03 MED ORDER — ATORVASTATIN CALCIUM 80 MG PO TABS
80.0000 mg | ORAL_TABLET | Freq: Every day | ORAL | Status: DC
  Administered 2017-05-03: 80 mg via ORAL
  Filled 2017-05-03 (×2): qty 1

## 2017-05-03 MED ORDER — NITROGLYCERIN 1 MG/10 ML FOR IR/CATH LAB
INTRA_ARTERIAL | Status: AC
  Filled 2017-05-03: qty 10

## 2017-05-03 MED ORDER — SODIUM CHLORIDE 0.9 % IV SOLN: 250.0000 mL | INTRAVENOUS | Status: DC | PRN

## 2017-05-03 MED ORDER — SODIUM CHLORIDE 0.9% FLUSH: 3.0000 mL | Freq: Two times a day (BID) | INTRAVENOUS | Status: DC

## 2017-05-03 MED ORDER — ASPIRIN 81 MG PO CHEW: 81.0000 mg | CHEWABLE_TABLET | ORAL | Status: DC

## 2017-05-03 MED ORDER — SODIUM CHLORIDE 0.9 % WEIGHT BASED INFUSION: 1.0000 mL/kg/h | INTRAVENOUS | Status: DC

## 2017-05-03 MED ORDER — ONDANSETRON HCL 4 MG/2ML IJ SOLN: 4.0000 mg | Freq: Four times a day (QID) | INTRAMUSCULAR | Status: DC | PRN

## 2017-05-03 MED ORDER — CLOPIDOGREL BISULFATE 75 MG PO TABS
75.0000 mg | ORAL_TABLET | Freq: Every day | ORAL | Status: DC
  Administered 2017-05-04: 75 mg via ORAL
  Filled 2017-05-03: qty 1

## 2017-05-03 MED ORDER — SODIUM CHLORIDE 0.9% FLUSH
3.0000 mL | Freq: Two times a day (BID) | INTRAVENOUS | Status: DC
  Administered 2017-05-03: 3 mL via INTRAVENOUS

## 2017-05-03 MED ORDER — SODIUM CHLORIDE 0.9 % IV SOLN: INTRAVENOUS | Status: AC

## 2017-05-03 MED ORDER — MIDAZOLAM HCL 2 MG/2ML IJ SOLN
INTRAMUSCULAR | Status: AC
  Filled 2017-05-03: qty 2

## 2017-05-03 MED ORDER — MIDAZOLAM HCL 2 MG/2ML IJ SOLN
INTRAMUSCULAR | Status: DC | PRN
  Administered 2017-05-03: 1 mg via INTRAVENOUS

## 2017-05-03 MED ORDER — IOPAMIDOL (ISOVUE-370) INJECTION 76%
INTRAVENOUS | Status: DC | PRN
  Administered 2017-05-03: 165 mL via INTRA_ARTERIAL

## 2017-05-03 MED ORDER — IOPAMIDOL (ISOVUE-370) INJECTION 76%
INTRAVENOUS | Status: AC
  Filled 2017-05-03: qty 100

## 2017-05-03 MED ORDER — ADENOSINE (DIAGNOSTIC) 140MCG/KG/MIN
INTRAVENOUS | Status: DC | PRN
  Administered 2017-05-03: 140 ug/kg/min via INTRAVENOUS

## 2017-05-03 MED ORDER — HYDRALAZINE HCL 20 MG/ML IJ SOLN: 5.0000 mg | INTRAMUSCULAR | Status: AC | PRN

## 2017-05-03 MED ORDER — VERAPAMIL HCL 2.5 MG/ML IV SOLN
INTRAVENOUS | Status: DC | PRN
  Administered 2017-05-03: 10 mL via INTRA_ARTERIAL

## 2017-05-03 MED ORDER — LOSARTAN POTASSIUM 50 MG PO TABS
100.0000 mg | ORAL_TABLET | Freq: Every day | ORAL | Status: DC
  Administered 2017-05-04: 100 mg via ORAL
  Filled 2017-05-03: qty 2

## 2017-05-03 MED ORDER — HEPARIN SODIUM (PORCINE) 1000 UNIT/ML IJ SOLN
INTRAMUSCULAR | Status: DC | PRN
  Administered 2017-05-03 (×2): 2000 [IU] via INTRAVENOUS
  Administered 2017-05-03: 4000 [IU] via INTRAVENOUS
  Administered 2017-05-03: 3000 [IU] via INTRAVENOUS
  Administered 2017-05-03: 4000 [IU] via INTRAVENOUS

## 2017-05-03 MED ORDER — LIDOCAINE HCL 2 % IJ SOLN
INTRAMUSCULAR | Status: AC
  Filled 2017-05-03: qty 10

## 2017-05-03 MED ORDER — EZETIMIBE 10 MG PO TABS
10.0000 mg | ORAL_TABLET | Freq: Every day | ORAL | Status: DC
  Administered 2017-05-04: 10 mg via ORAL
  Filled 2017-05-03: qty 1

## 2017-05-03 MED ORDER — ASPIRIN EC 81 MG PO TBEC
81.0000 mg | DELAYED_RELEASE_TABLET | Freq: Every day | ORAL | Status: DC
  Administered 2017-05-04: 81 mg via ORAL
  Filled 2017-05-03: qty 1

## 2017-05-03 MED ORDER — ENOXAPARIN SODIUM 40 MG/0.4ML ~~LOC~~ SOLN
40.0000 mg | SUBCUTANEOUS | Status: DC
  Administered 2017-05-04: 40 mg via SUBCUTANEOUS
  Filled 2017-05-03: qty 0.4

## 2017-05-03 MED ORDER — FENTANYL CITRATE (PF) 100 MCG/2ML IJ SOLN
INTRAMUSCULAR | Status: AC
  Filled 2017-05-03: qty 2

## 2017-05-03 MED ORDER — LIDOCAINE HCL (PF) 1 % IJ SOLN
INTRAMUSCULAR | Status: DC | PRN
  Administered 2017-05-03: 2 mL via INTRADERMAL

## 2017-05-03 MED ORDER — ADENOSINE 12 MG/4ML IV SOLN
INTRAVENOUS | Status: AC
  Filled 2017-05-03: qty 16

## 2017-05-03 MED ORDER — CLOPIDOGREL BISULFATE 300 MG PO TABS
ORAL_TABLET | ORAL | Status: AC
  Filled 2017-05-03: qty 2

## 2017-05-03 SURGICAL SUPPLY — 24 items
BALLN SAPPHIRE 2.5X10 (BALLOONS) ×2
BALLN SAPPHIRE ~~LOC~~ 2.0X12 (BALLOONS) ×2 IMPLANT
BALLN SAPPHIRE ~~LOC~~ 3.5X12 (BALLOONS) ×2 IMPLANT
BALLOON SAPPHIRE 2.5X10 (BALLOONS) ×1 IMPLANT
CATH 5FR JL3.5 JR4 ANG PIG MP (CATHETERS) ×2 IMPLANT
CATH LAUNCHER 5F EBU3.0 (CATHETERS) ×1 IMPLANT
CATHETER LAUNCHER 5F EBU3.0 (CATHETERS) ×2
COVER PRB 48X5XTLSCP FOLD TPE (BAG) ×1 IMPLANT
COVER PROBE 5X48 (BAG) ×1
DEVICE RAD COMP TR BAND LRG (VASCULAR PRODUCTS) ×2 IMPLANT
GLIDESHEATH SLEND SS 6F .021 (SHEATH) ×2 IMPLANT
GUIDEWIRE INQWIRE 1.5J.035X260 (WIRE) ×1 IMPLANT
GUIDEWIRE PRESSURE COMET II (WIRE) ×2 IMPLANT
INQWIRE 1.5J .035X260CM (WIRE) ×2
KIT ENCORE 26 ADVANTAGE (KITS) ×2 IMPLANT
KIT ESSENTIALS PG (KITS) ×2 IMPLANT
KIT HEART LEFT (KITS) ×2 IMPLANT
PACK CARDIAC CATHETERIZATION (CUSTOM PROCEDURE TRAY) ×2 IMPLANT
STENT SIERRA 3.50 X 15 MM (Permanent Stent) ×2 IMPLANT
TRANSDUCER W/STOPCOCK (MISCELLANEOUS) ×2 IMPLANT
TUBING CIL FLEX 10 FLL-RA (TUBING) ×2 IMPLANT
WIRE HI TORQ VERSACORE-J 145CM (WIRE) ×2 IMPLANT
WIRE HI TORQ WHISPER MS 190CM (WIRE) ×2 IMPLANT
WIRE RUNTHROUGH .014X180CM (WIRE) ×2 IMPLANT

## 2017-05-03 NOTE — H&P (View-Only) (Signed)
Patient ID: Kerry Perez, female   DOB: 11-Dec-1950, 66 y.o.   MRN: [id number]    Chief complain: follow up for CAD, chest pain with congestion  History of Present Illness: Kerry Perez is a 66 year old female with a history of HTN, HLD, diet controlled DM, OA, GERD, positive family history for CAD who was recently evaluated in the office for chest pain and dyspnea. Cardiac Ct showed calcium score of 144 and one vessel CAD in the LAD.   She was then referred for cardiac cath - this showed a moderate LAD lesion with a nonischemic nuclear stress test. She will be managed medically.  Does have diastolic dysfunction - EF is normal at 60 to 65%. Her statin was increased. She was also noted to have mild AS by echo and normocytic anemia on her labs. The cardiac CT also showed some prominent lymphoid tissue - no constitutional symptoms - asked to follow up with PCP.  08/12/2015- the patient is coming after 1 year, she is doing great, walking daily, following her steps, always > 10.000, no DOE, no chest pain other than in the last week when she has been dealing with cold. No LE edema, palpitations, syncope, no orthopnea, PND. Complaint with meds, no muscle pain with atorvastatin.  08/14/2016 - the patient is coming after one year, in the meantime she has been exercising and eating healthy and feeling well. She has spent a lot of time in the [place] with her grandchildren and developed left sided chest pain with position changes and with deep inspiration. She was seen in the urgent care setting and by cardiologist in [place] and they concluded that this is related to pleurisy. She used ibuprofen with complete resolution of her chest pain. She has been compliant with her medicines and has no side effects and would like to cut down some.  04/11/2017 - six-month follow-up, the patient continues to walk everyday minimal 10,000 steps, she has noticed recently chest pressure with peak exercise that resolves  at rest. She is compliant with her medication and is tolerating that well. She denies any lower extremity edema orthopnea or proximal nocturnal dyspnea she is no palpitations or syncope.   Allergies  Allergen Reactions  . Lisinopril Other (See Comments)    Lips swelled   Past Medical History:  Diagnosis Date  . Abnormal cardiac CT angiography    a. Ancillary findings: prominent hilar lymphoid tissue nonspecific - possibly related to CHF and lymphatic congestion, clinical correlation for signs/sx of ymphoproliferative disorder or other malignancy is suggested - denied constitutional sx and will f/u with PCP.  Marland Kitchen Anemia   . Aortic stenosis    a. Mild by echo 10/2013.  Marland Kitchen Arthritis    "left knee"   . CAD (coronary artery disease)    a. Moderate LAD lesion by cath 10/15/13 with nonischemic nuclear stress test 10/17/13  . Diastolic CHF (HCC)    a. Dx 10/2013 - normal EF but elevated LVEDP by cath.  . Diet-controlled diabetes mellitus (HCC)   . GERD (gastroesophageal reflux disease)    mild  . Heart failure (HCC)   . Hypercholesterolemia   . Hypertension     Past Surgical History:  Procedure Laterality Date  . APPENDECTOMY  1965  . BREAST BIOPSY Right ~ 2000; ~ 2002"   "both benign"  . KNEE ARTHROSCOPY Left 06/18/2013   Procedure: LEFT KNEE ARTHROSCOPY WITH DEBRIDEMENT;  Surgeon: Loanne Drilling, MD;  Location: [facility];  Service: Orthopedics;  Laterality:  Left;  . LEFT HEART CATHETERIZATION WITH CORONARY ANGIOGRAM N/A 10/15/2013   Procedure: LEFT HEART CATHETERIZATION WITH CORONARY ANGIOGRAM;  Surgeon: Iran Ouch, MD;  Location: [facility];  Service: Cardiovascular;  Laterality: N/A;  . STOMACH SURGERY  1965   Hairball removed from stomach    History  Smoking Status  . Never Smoker  Smokeless Tobacco  . Never Used    History  Alcohol Use  . 2.4 - 3.0 oz/week  . 4 - 5 Glasses of wine per week    Family History  Problem Relation Age of Onset  . Hypertension Mother   .  Heart disease Mother   . Osteoporosis Sister     Review of Systems: The review of systems is per the HPI.  All other systems were reviewed and are negative.  Physical Exam: BP 124/64   Pulse (!) 55   Ht 5' 1.75" (1.568 m)   Wt 165 lb (74.8 kg)   LMP 08/07/1998   BMI 30.42 kg/m  Patient is very pleasant and in no acute distress. [place] accent. She is obese. Skin is warm and dry. Color is normal.  HEENT is unremarkable. Normocephalic/atraumatic. PERRL. Sclera are nonicteric. Neck is supple. No masses. No JVD. Lungs are clear. Cardiac exam shows a regular rate and rhythm. She has a 3/6 systolic murmur,  Abdomen is soft. Extremities are without edema. Gait and ROM are intact. No gross neurologic deficits noted.  Wt Readings from Last 3 Encounters:  04/11/17 165 lb (74.8 kg)  08/17/16 164 lb (74.4 kg)  08/12/15 164 lb (74.4 kg)    LABORATORY DATA: EKG with sinus brady - unchanged. Reviewed with Dr. Delton See  Lab Results  Component Value Date   WBC 5.2 08/13/2015   HGB 12.1 08/13/2015   HCT 36.3 08/13/2015   PLT 317 08/13/2015   GLUCOSE 99 08/13/2015   CHOL 156 08/13/2015   TRIG 56 08/13/2015   HDL 87 08/13/2015   LDLCALC 58 08/13/2015   ALT 16 08/13/2015   AST 24 08/13/2015   NA 136 08/13/2015   K 3.6 08/13/2015   CL 98 08/13/2015   CREATININE 0.81 08/13/2015   BUN 20 08/13/2015   CO2 30 08/13/2015   TSH 1.734 08/13/2015   INR 0.96 10/15/2013   Echo Study Conclusions  - Left ventricle: The cavity size was normal. Wall thickness was increased in a pattern of mild LVH. Systolic function was normal. The estimated ejection fraction was in the range of 60% to 65%. Wall motion was normal; there were no regional wall motion abnormalities. Left ventricular diastolic function parameters were normal. - Aortic valve: There was very mild stenosis. Mild regurgitation. Valve area: 1.73cm^2(VTI). Valve area: 1.67cm^2 (Vmax). - Right atrium: The atrium was mildly  dilated.  Coronary angiography:  Coronary dominance: right  Left Main: normal  Left Anterior Descending (LAD): Normal in size and mildly calcified. There is a 50-60% discrete stenosis after the origin of second diagonal. The rest of the midsegment has minor irregularities. There is mild myocardial bridging noted in the midsegment.  1st diagonal (D1): Normal in size with 30% ostial stenosis.  2nd diagonal (D2): Normal in size with 20% ostial stenosis.  3rd diagonal (D3): Small in size with minor irregularities.  Circumflex (LCx): Normal in size and nondominant. The vessel has minor irregularities.  1st obtuse marginal: Very small in size.  2nd obtuse marginal: Normal in size with no significant disease.  3rd obtuse marginal: Normal in size with minor irregularities.  Right Coronary Artery: normal in size and dominant. The stent percent proximal stenosis and 20% mid stenosis.  Posterior descending artery: normal in size with no significant disease.  Posterior AV segment: normal in size with no significant disease.  Posterolateral branchs: No significant disease. Left ventriculography: Left ventricular systolic function is normal , LVEF is estimated at 60 %, there is no significant mitral regurgitation  Final Conclusions:  1. Moderate mid LAD stenosis with mild mid myocardial bridging.  2.Normal LV systolic function with mildly elevated left ventricular end-diastolic pressure.  Recommendations:  I requested an echocardiogram. Recommend initial medical therapy. If the patient fails medical therapy, consider functional testing or pressure wire interrogation of the LAD  Lorine BearsMuhammad Arida MD, [facility] LakeFACC  10/15/2013, 7:47 PM  Chest CT 10/2013 IMPRESSION:  No evidence for mediastinal or hilar lymphadenopathy.  Probable bilateral tiny calcified granulomata. As the 2 mm nodule in  the left lower lobe cannot be definitely seen to be calcified,  followup by consensus criteria is suggested. If the patient is  at  high risk for bronchogenic carcinoma, follow-up chest CT at 1 year  is recommended. If the patient is at low risk, no follow-up is  needed. This recommendation follows the consensus statement:  Guidelines for Management of Small Pulmonary Nodules Detected on CT  Scans: A Statement from the Fleischner Society as published in  Radiology 2005; 237:395-400.  Electronically Signed  By: Kennith CenterEric Mansell M.D.  On: 11/03/2013 12:06  CARDIAC CT IMPRESSION: 1. The appearance of the lungs suggests mild interstitial pulmonary edema. Given the presence of bilateral pleural effusions, findings suggest congestive heart failure. 2. Prominent lymphoid tissue in the hilar regions bilaterally is nonspecific. In the setting of potential congestive failure, this may be related to edema and lymphatic congestion, however, clinical correlation for signs and symptoms of the lymphoproliferative disorder or other malignancy is suggested.  Electronically Signed By: Trudie Reedaniel Entrikin M.D. On: 10/16/2013 18:18  IMPRESSION: 1. Coronary calcium score of 144. This was 1896 percentile for age and sex matched control.  2. Normal origin or coronary arteries, right dominance.  3. One vessel coronary artery disease with significant lesions in the mid and possibly proximal LAD. Cardiac catheterization is recommended.  Tobias AlexanderKatarina Tramain Gershman   TTE: 06/28/2016 Impression:  1. Left ventricular systolic function is normal.  2. Visually estimated ejection fraction is 70%.   3. The left ventricular cavity size is normal by M-Mode.  4. Mild aortic regurgitation.   5. Aortic valve is sclerotic.   6. There were no stress-induced wall motion abnormalities. This is a  negative stress echo test for  ischemia at 95 % PMHR.   7. Mild degree of aortic stenosis is present with peak gradient of 27.6   mmHg, mean gradient of 17.0 mmHg and a calculated valve area of 1.95 cm?.  8. Mild tricuspid regurgitation.  9. With an assumed right atrial pressure of 3 mmHg, the estimated right   ventricular systolic pressure is mildly elevated at 37.1 mmHg.   10. The left atrium is mildly dilated by M-Mode.    EKG performed today 04/11/2017 was personally reviewed shows sinus bradycardia otherwise normal EKG and unchanged from prior.    Assessment / Plan:  1. CAD - moderate LAD disease on a cath in 12/2012, we have been managing medically for the last 4 years however worsening chest pressure on exertion we will perform coronary CTA with CTS S4 as her lesion was borderline last time in invasive FFR was not performed.   2.  Mild aortic stenosis and aortic regurgitation on echocardiogram in 2015, we will repeat echocardiogram now.  Mild aortic stenosis with mean gradient of 17 mmHg.  3. Chronic diastolic HF - compensated. Continue with current regimen. Lasix when necessary she hasn't used it in years.  4. Abnormal cardiac CT - lymphoid tissue or coronary CT however a followup CT chest showed only 2 mm lung nodule. No lymphadenopathy inhaler or mediastinal region. No mass in the neck area. Repeat chest CT showed stable lesion and doesn't need to be repeated for this indication.   5. Hyperlipidemia, all lipids repeated by her primary care physician in July of this year old lipids at goal. Normal LFTs. Continue atorvastatin 40 mg.  6. Hypertension, well controlled.  Follow up in 6 months.  Tobias Alexander 04/11/2017

## 2017-05-03 NOTE — Brief Op Note (Signed)
Brief Cardiac Catheterization Note  Date: 05/03/2017 Time: 1:31 PM  PATIENT:  Kerry Perez  66 y.o. female  PRE-OPERATIVE DIAGNOSIS:  Stable angina  POST-OPERATIVE DIAGNOSIS:  Same  PROCEDURE:  Procedure(s): LEFT HEART CATH AND CORONARY ANGIOGRAPHY (N/A)  SURGEON:  Surgeon(s) and Role:    [name], MD - Primary   FINDINGS: 1. Hemodynamically significant single vessel coronary artery disease with sequential 30% and 60% ostial and mid LAD stenoses. 2. Normal left ventricular contraction. 3. Successful PCI to mid LAD with placement of Xience Sierra 3.5 x 15 mm DES. Ostium of D2 was angioplastied due to plaque shift and chest pain following PCI to LAD.  RECOMMENDATIONS: 1. Overnight observation due to chest pain during procedure with jailed D2 branch. 2. DAPT with aspirin and clopidogrel for at least 6 months, ideally longer. 3. Continue aggressive secondary prevention. 4. Hold metoprolol due to sinus bradycardia throughout the case.  [name], MD [facility] Pager: [phone]

## 2017-05-03 NOTE — Progress Notes (Signed)
TR BAND REMOVAL  LOCATION:    right radial  DEFLATED PER PROTOCOL:    Yes.    TIME BAND OFF / DRESSING APPLIED:    1715   SITE UPON ARRIVAL:    Level 0  SITE AFTER BAND REMOVAL:    Level 0  CIRCULATION SENSATION AND MOVEMENT:    Within Normal Limits   Yes.    COMMENTS:   Tolerated procedure well 

## 2017-05-03 NOTE — Interval H&P Note (Signed)
History and Physical Interval Note:  05/03/2017 11:22 AM  Kerry Perez  has presented today for surgery, with the diagnosis of stable angina and abnormal coronary CT.  The various methods of treatment have been discussed with the patient and family. After consideration of risks, benefits and other options for treatment, the patient has consented to  Procedure(s): LEFT HEART CATH AND CORONARY ANGIOGRAPHY (N/A) as a surgical intervention .  The patient's history has been reviewed, patient examined, no change in status, stable for surgery.  I have reviewed the patient's chart and labs.  Questions were answered to the patient's satisfaction.    Cath Lab Visit (complete for each Cath Lab visit)  Clinical Evaluation Leading to the Procedure:   ACS: No.  Non-ACS:    Anginal Classification: CCS III  Anti-ischemic medical therapy: Minimal Therapy (1 class of medications)  Non-Invasive Test Results: Intermediate-risk stress test findings: cardiac mortality 1-3%/year  Prior CABG: No previous CABG  Kai Calico

## 2017-05-03 NOTE — Care Management Note (Signed)
Case Management Note  Patient Details  Name: [name] MRN: [id number] Date of Birth: Dec 22, 1950  Subjective/Objective:   From home, s/p Successful angioplasty, will be on plavix and asa.                 Action/Plan: NCM will follow for dc needs.   Expected Discharge Date:                  Expected Discharge Plan:  Home/Self Care  In-House Referral:     Discharge planning Services  CM Consult  Post Acute Care Choice:    Choice offered to:     DME Arranged:    DME Agency:     HH Arranged:    HH Agency:     Status of Service:  Completed, signed off  If discussed at [employer] of Stay Meetings, dates discussed:    Additional Comments:  Leone Haven, RN 05/03/2017, 3:50 PM

## 2017-05-04 ENCOUNTER — Telehealth: Payer: Self-pay | Admitting: Cardiology

## 2017-05-04 ENCOUNTER — Encounter (HOSPITAL_COMMUNITY): Payer: Self-pay | Admitting: Internal Medicine

## 2017-05-04 DIAGNOSIS — E782 Mixed hyperlipidemia: Secondary | ICD-10-CM | POA: Diagnosis not present

## 2017-05-04 DIAGNOSIS — I5032 Chronic diastolic (congestive) heart failure: Secondary | ICD-10-CM | POA: Diagnosis present

## 2017-05-04 DIAGNOSIS — I2584 Coronary atherosclerosis due to calcified coronary lesion: Secondary | ICD-10-CM | POA: Diagnosis not present

## 2017-05-04 DIAGNOSIS — I208 Other forms of angina pectoris: Secondary | ICD-10-CM

## 2017-05-04 DIAGNOSIS — I110 Hypertensive heart disease with heart failure: Secondary | ICD-10-CM | POA: Diagnosis not present

## 2017-05-04 DIAGNOSIS — I10 Essential (primary) hypertension: Secondary | ICD-10-CM

## 2017-05-04 DIAGNOSIS — E119 Type 2 diabetes mellitus without complications: Secondary | ICD-10-CM | POA: Diagnosis not present

## 2017-05-04 DIAGNOSIS — Z95820 Peripheral vascular angioplasty status with implants and grafts: Secondary | ICD-10-CM

## 2017-05-04 DIAGNOSIS — I25118 Atherosclerotic heart disease of native coronary artery with other forms of angina pectoris: Secondary | ICD-10-CM | POA: Diagnosis not present

## 2017-05-04 DIAGNOSIS — Z23 Encounter for immunization: Secondary | ICD-10-CM | POA: Diagnosis not present

## 2017-05-04 HISTORY — DX: Peripheral vascular angioplasty status with implants and grafts: Z95.820

## 2017-05-04 LAB — BASIC METABOLIC PANEL
ANION GAP: 8 (ref 5–15)
BUN: 16 mg/dL (ref 6–20)
CALCIUM: 8.6 mg/dL — AB (ref 8.9–10.3)
CO2: 27 mmol/L (ref 22–32)
Chloride: 102 mmol/L (ref 101–111)
Creatinine, Ser: 0.76 mg/dL (ref 0.44–1.00)
Glucose, Bld: 93 mg/dL (ref 65–99)
Potassium: 3.6 mmol/L (ref 3.5–5.1)
SODIUM: 137 mmol/L (ref 135–145)

## 2017-05-04 LAB — CBC
HEMATOCRIT: 33.8 % — AB (ref 36.0–46.0)
HEMOGLOBIN: 11 g/dL — AB (ref 12.0–15.0)
MCH: 29.9 pg (ref 26.0–34.0)
MCHC: 32.5 g/dL (ref 30.0–36.0)
MCV: 91.8 fL (ref 78.0–100.0)
Platelets: 185 10*3/uL (ref 150–400)
RBC: 3.68 MIL/uL — AB (ref 3.87–5.11)
RDW: 12.8 % (ref 11.5–15.5)
WBC: 5.4 10*3/uL (ref 4.0–10.5)

## 2017-05-04 LAB — GLUCOSE, CAPILLARY: Glucose-Capillary: 77 mg/dL (ref 65–99)

## 2017-05-04 MED ORDER — CLOPIDOGREL BISULFATE 75 MG PO TABS: 75.0000 mg | ORAL_TABLET | Freq: Every day | ORAL | 11 refills | Status: DC

## 2017-05-04 MED ORDER — ANGIOPLASTY BOOK
Freq: Once | Status: DC
  Filled 2017-05-04: qty 1

## 2017-05-04 MED FILL — Lidocaine HCl Local Inj 2%: INTRAMUSCULAR | Qty: 10 | Status: AC

## 2017-05-04 NOTE — Progress Notes (Signed)
CARDIAC REHAB PHASE I   PRE:  Rate/Rhythm: 66 SR  BP:  Supine: 111/37  Sitting:   Standing:    SaO2: 97%RA  MODE:  Ambulation: 600 ft   POST:  Rate/Rhythm: 76 SR  BP:  Supine: 137/40  Sitting:   Standing:    SaO2: 97%RA [id number] Pt walked 600 ft with steady gait. Tolerated well with no CP. Education completed with pt who voiced understanding. Stressed importance of plavix with stent. Reviewed NTG use, ex ed, heart healthy diet with watching carbs. Discussed CRP 2 and referring to [place] program.   Luetta Nutting, RN BSN  05/04/2017 8:52 AM

## 2017-05-04 NOTE — Discharge Summary (Signed)
Discharge Summary    Patient ID: Kerry Perez,  MRN: [id number], DOB/AGE: 10-22-1950 66 y.o.  Admit date: 05/03/2017 Discharge date: 05/04/2017  Primary Care Provider: Shaune Pollack Primary Cardiologist: Dr. Delton See  Discharge Diagnoses    Principal Problem:   Abnormal cardiac CT angiography Active Problems:   S/P angioplasty with stent 05/03/17 to mLAD with DES and ostium of D2 with 50% residual stenosis.     Pure hypercholesterolemia   CAD (coronary artery disease)   Mild aortic stenosis   Hyperlipidemia   Stable angina (HCC)   Chronic diastolic heart failure (HCC) Allergies Allergies  Allergen Reactions  . Lisinopril Other (See Comments)    Lips swelled    Diagnostic Studies/Procedures    Cardiac cath Procedures   INTRAVASCULAR PRESSURE WIRE/FFR STUDY  LEFT HEART CATH AND CORONARY ANGIOGRAPHY  Conclusion   Conclusions: 1. Significant single-vessel coronary artery disease, with sequential 30% and 60% ostial and mid LAD stenosis that are hemodynamically significant by FFR (FFR 0.70). 2. Moderate to severe ostial disease involving small first and moderate caliber second diagonal branches. 3. Normal left ventricular contraction and filling pressure. 4. Mild aortic stenosis. 5. Successful PCI to mid LAD with placement of Xience Sierra 3.5 x 15 mm drug-eluting stent with 0% residual stenosis and TIMI-3 flow. 6. Successful angioplasty to ostium of D2 due to plaque shift during PCI to LAD, with 50% residual stenosis and TIMI-3 flow.  Recommendations: 1. Admit for overnight observation given jailed D2 branch with chest pain during case. Pain had resolved by the end of the procedure. 2. Dual antiplatelet therapy with aspirin and clopidogrel for at least 6 months, ideally longer. 3. Aggressive secondary prevention and medical management of residual diagonal disease.  [name], MD [facility] Pager: [phone]    _____________   History of  Present Illness     66 year old female with a history of HTN, HLD, diet controlled DM, OA, GERD, positive family history for CAD in 2015 cardiac CT was + and pt had cath with moderate LAD lesion with neg nuc study.  Managed medically.   She underwent cardiac CTA with FFR 04/23/17 with hemodynamic significant stenosis in the mLAD.  She was arranged to have cardiac cath.      Hospital Course     Consultants: none    She did well with cardiac cath. TA showed moderate lesion with CT FFR 0.76, invasive FFR 0.73,   S/P successful PCI to mid LAD with placement of Xience Sierra 3.5 x 15 mm drug-eluting stent with 0% residual stenosis and TIMI-3 flow. Successful angioplasty to ostium of D2 due to plaque shift during PCI to LAD, with 50% residual stenosis and TIMI-3 flow.  EF 55-65%     By the next AM post procedure.   She was pain free and found to be stable for discharge by Dr. Delton See she walked with cardiac rehab without problems.  She is on statin, Plavix asa she will follow up TOC in 1 week.  We do recommend cardiac rehab.  _____________  Discharge Vitals Blood pressure (!) 137/40, pulse 79, temperature 97.7 F (36.5 C), temperature source Oral, resp. rate 15, height  (1.575 m), weight 169 lb (76.7 kg), last menstrual period 08/07/1998, SpO2 97 %.  Filed Weights   05/03/17 0805 05/04/17 0618  Weight: 162 lb (73.5 kg) 169 lb (76.7 kg)    Labs & Radiologic Studies    CBC  Recent Labs  05/04/17 0353  WBC  5.4  HGB 11.0*  HCT 33.8*  MCV 91.8  PLT 185   Basic Metabolic Panel  Recent Labs  05/04/17 0353  NA 137  K 3.6  CL 102  CO2 27  GLUCOSE 93  BUN 16  CREATININE 0.76  CALCIUM 8.6*   Liver Function Tests No results for input(s): AST, ALT, ALKPHOS, BILITOT, PROT, ALBUMIN in the last 72 hours. No results for input(s): LIPASE, AMYLASE in the last 72 hours. Cardiac Enzymes No results for input(s): CKTOTAL, CKMB, CKMBINDEX, TROPONINI in the last 72 hours. BNP Invalid  input(s): POCBNP D-Dimer No results for input(s): DDIMER in the last 72 hours. Hemoglobin A1C No results for input(s): HGBA1C in the last 72 hours. Fasting Lipid Panel No results for input(s): CHOL, HDL, LDLCALC, TRIG, CHOLHDL, LDLDIRECT in the last 72 hours. Thyroid Function Tests No results for input(s): TSH, T4TOTAL, T3FREE, THYROIDAB in the last 72 hours.  Invalid input(s): FREET3 _____________  Ct Coronary Morph W/cta Cor W/score W/ca W/cm &/or Wo/cm  Addendum Date: 04/23/2017   ADDENDUM REPORT: 04/23/2017 18:56 CLINICAL DATA:  66 year old female with atypical chest pain and family h/o premature CAD. EXAM: Cardiac/Coronary  CT TECHNIQUE: The patient was scanned on a [employer]. FINDINGS: A 120 kV prospective scan was triggered in the descending thoracic aorta at 111 HU's. Axial non-contrast 3 mm slices were carried out through the heart. The data set was analyzed on a dedicated work station and scored using the Agatson method. Gantry rotation speed was 250 msecs and collimation was .6 mm. No beta blockade and 0.8 mg of sl NTG was given. The 3D data set was reconstructed in 5% intervals of the 67-82 % of the R-R cycle. Diastolic phases were analyzed on a dedicated work station using MPR, MIP and VRT modes. The patient received 80 cc of contrast. Aorta:  Normal size.  Mild diffuse calcifications.  No dissection. Aortic Valve:  Trileaflet.  No calcifications. Coronary Arteries:  Normal coronary origin.  Right dominance. RCA is a very large dominant artery that gives rise to PDA and PLVB. There is mild calcified plaque in the proximal RCA associated with 25-50% stenosis. PDA has no obvious plaque. PLVB has a non-calcified plaque in the mid portion where the lumen is small and the severity of stenosis is difficult to assess. Left main is a large artery that gives rise to LAD and LCX arteries. Distal left main has minimal calcified plaque with associated stenosis 0-25%. LAD is a large vessel  that gives rise to two small diagonal branches and wraps around the apex. Proximal LAD has a long calcified plaque with associated stenosis 25-50%. Proximal to mid LAD has a moderate calcified plaque with associated stenosis of 50-69%. Mid to distal LAD has no significant plaque. D1 is small with no obvious plaque. D2 is small and originates near the calcified plaque in the LAD, but has no significant stenosis. LCX is a non-dominant artery that gives rise to one large OM1 branch. There moderate mixed plaque in the proximal LCX artery with associated stenosis 50-75% but possibly > 70%. OM1 is a large tortuous vessel with has minimal non-calcified plaque with stenosis 0-25%. Other findings: Normal pulmonary vein drainage into the left atrium. Normal let atrial appendage without a thrombus. Normal size of the pulmonary artery. IMPRESSION: 1. Coronary calcium score of 384. This was 86 percentile for age and sex matched control. Calcium score 144 in 10/2013. 2. Normal coronary origin with right dominance. 3. Moderate plaque in the proximal to  mid LAD and at least moderate plaque in the proximal LCX artery. Additional analysis with CT FFR will be performed. Tobias Alexander Electronically Signed   By: Tobias Alexander   On: 04/23/2017 18:56   Result Date: 04/23/2017 EXAM: OVER-READ INTERPRETATION  CT CHEST The following report is an over-read performed by radiologist Dr. Noe Gens [facility] on 04/23/2017. This over-read does not include interpretation of cardiac or coronary anatomy or pathology. The coronary CTA interpretation by the cardiologist is attached. COMPARISON:  08/18/2015 FINDINGS: Cardiovascular: Heart is normal size. Aorta is normal caliber. Scattered aortic calcifications. Mediastinum/Nodes: No mediastinal, hilar, or axillary adenopathy. Lungs/Pleura: Calcified granuloma posteriorly in the left upper lobe. Scarring in the left base. No effusions. Upper Abdomen: Imaging into the upper  abdomen shows no acute findings. Musculoskeletal: Chest wall soft tissues are unremarkable. No acute bony abnormality. IMPRESSION: Aortic atherosclerosis. No acute extra cardiac abnormality. Electronically Signed: By: Charlett Nose M.D. On: 04/23/2017 14:33   Ct Coronary Fractional Flow Reserve Data Prep  Result Date: 04/24/2017 EXAM: FF/RCT ANALYSIS FINDINGS: FFRct analysis was performed on the original cardiac CT angiogram dataset. Diagrammatic representation of the FFRct analysis is provided in a separate PDF document in PACS. This dictation was created using the PDF document and an interactive 3D model of the results. 3D model is not available in the EMR/PACS. Normal FFR range is >0.80. 1. Left Main:  No significant stenosis. 2. LAD: Proximal LAD CT FFR: 0.89. Mid LAD CT FFR: 0.84. Mid to distal LAD CT FFR: 079. 3. LCX: No significant stenosis. 4. RCA: No significant stenosis. IMPRESSION: 1. CT FFR analysis showed hemodynamically significant stenosis in the mid LAD. Electronically Signed   By: Tobias Alexander   On: 04/24/2017 12:30   Disposition   Pt is being discharged home today in good condition.  Follow-up Plans & Appointments   Heart Healthy diet  Call [facility] at [phone] if any bleeding, swelling or drainage at cath site.  May shower, no tub baths for 48 hours for groin sticks. No lifting over 5 pounds for 3 days.  No Driving for 3 days  Do Not stop Plavix and Asprin they help keep stents open.     Discharge Instructions    Amb Referral to Cardiac Rehabilitation    Complete by:  As directed    Diagnosis:  Coronary Stents      Discharge Medications   Current Discharge Medication List    START taking these medications   Details  clopidogrel (PLAVIX) 75 MG tablet Take 1 tablet (75 mg total) by mouth daily with breakfast. Qty: 30 tablet, Refills: 11      CONTINUE these medications which have NOT CHANGED   Details  aspirin 81 MG tablet Take  81 mg by mouth daily.    atorvastatin (LIPITOR) 80 MG tablet Take 1 tablet (80 mg total) by mouth daily. Qty: 90 tablet, Refills: 2    CALCIUM PO Take 1 tablet by mouth daily.    Cholecalciferol (VITAMIN D) 2000 UNITS CAPS Take 1 capsule by mouth daily.     ezetimibe (ZETIA) 10 MG tablet Take 1 tablet (10 mg total) by mouth daily. Qty: 90 tablet, Refills: 2    losartan-hydrochlorothiazide (HYZAAR) 100-25 MG tablet Take 1 tablet by mouth daily.    metoprolol succinate (TOPROL-XL) 25 MG 24 hr tablet TAKE 1 TABLET (25 MG TOTAL) BY MOUTH DAILY. Qty: 90 tablet, Refills: 3   Associated Diagnoses: Essential hypertension, benign; Acute diastolic heart  failure, NYHA class 2 (HCC); Pure hypercholesterolemia; Mild aortic stenosis    Multiple Vitamins-Minerals (MULTIVITAMIN PO) Take 1 tablet by mouth daily.     acetaminophen (TYLENOL) 325 MG tablet Take 650 mg by mouth every 6 (six) hours as needed for mild pain or headache.     furosemide (LASIX) 40 MG tablet Take 1 tablet (40 mg total) by mouth daily as needed (shortness of breath, fluid weight gain or fluid retention). Qty: 30 tablet, Refills: 11   Associated Diagnoses: Essential hypertension, benign; Acute diastolic heart failure, NYHA class 2 (HCC); Pure hypercholesterolemia; Mild aortic stenosis    nitroGLYCERIN (NITROSTAT) 0.4 MG SL tablet Place 1 tablet (0.4 mg total) under the tongue every 5 (five) minutes as needed for chest pain (up to 3 doses). Qty: 25 tablet, Refills: 4         Aspirin prescribed at discharge?  Yes High Intensity Statin Prescribed? (Lipitor 40-80mg  or Crestor 20-40mg ): Yes Beta Blocker Prescribed? Yes For EF <40%, was ACEI/ARB Prescribed? No: NA ADP Receptor Inhibitor Prescribed? (i.e. Plavix etc.-Includes Medically Managed Patients): Yes For EF <40%, Aldosterone Inhibitor Prescribed? NA Was EF assessed during THIS hospitalization? Yes Was Cardiac Rehab II ordered? (Included Medically managed Patients): Yes     Outstanding Labs/Studies   none  Duration of Discharge Encounter   Greater than 30 minutes including physician time.  Signed, Nada Boozer NP 05/04/2017, 11:25 AM

## 2017-05-04 NOTE — Progress Notes (Signed)
Progress Note  Patient Name: Kerry Perez Date of Encounter: 05/04/2017  Primary Cardiologist: Dr Delton See  Subjective   She is chest pain free this morning.   Inpatient Medications    Scheduled Meds: . angioplasty book   Does not apply Once  . aspirin EC  81 mg Oral Daily  . atorvastatin  80 mg Oral q1800  . clopidogrel  75 mg Oral Q breakfast  . enoxaparin (LOVENOX) injection  40 mg Subcutaneous Q24H  . ezetimibe  10 mg Oral Daily  . losartan  100 mg Oral Daily   And  . hydrochlorothiazide  25 mg Oral Daily  . Influenza vac split quadrivalent PF  0.5 mL Intramuscular Tomorrow-1000  . sodium chloride flush  3 mL Intravenous Q12H   Continuous Infusions: . sodium chloride     PRN Meds: sodium chloride, acetaminophen, ondansetron (ZOFRAN) IV, sodium chloride flush   Vital Signs    Vitals:   05/04/17 0210 05/04/17 0500 05/04/17 0618 05/04/17 0835  BP: (!) 106/54  124/68 (!) 137/40  Pulse: (!) 50 (!) 55 (!) 55 79  Resp: Temp: 97.7 F (36.5 C) 97.7 F (36.5 C) 97.7 F (36.5 C) 97.7 F (36.5 C)  TempSrc: Oral Oral Oral Oral  SpO2: 98%  97% 97%  Weight:   169 lb (76.7 kg)   Height:        Intake/Output Summary (Last 24 hours) at 05/04/17 0942 Last data filed at 05/04/17 0700  Gross per 24 hour  Intake           923.75 ml  Output             2000 ml  Net         -1076.25 ml   Filed Weights   05/03/17 0805 05/04/17 0618  Weight: 162 lb (73.5 kg) 169 lb (76.7 kg)    Telemetry    SB to SR - Personally Reviewed  ECG    SB - Personally Reviewed  Physical Exam   GEN: No acute distress.   Neck: No JVD Cardiac: RRR, 4/6 systolic murmurs, rubs, or gallops.  Respiratory: Clear to auscultation bilaterally. GI: Soft, nontender, non-distended  MS: No edema; No deformity. Neuro:  Nonfocal  Psych: Normal affect   Labs    Chemistry Recent Labs Lab 04/30/17 1115 05/04/17 0353  NA 137 137  K 4.0 3.6  CL 96 102  CO2 26 27  GLUCOSE  104* 93  BUN 23 16  CREATININE 0.81 0.76  CALCIUM 9.4 8.6*  GFRNONAA 76 >60  GFRAA 88 >60  ANIONGAP  --  8     Hematology Recent Labs Lab 04/30/17 1115 05/04/17 0353  WBC 4.8 5.4  RBC 3.91 3.68*  HGB 12.0 11.0*  HCT 36.1 33.8*  MCV 92 91.8  MCH 30.7 29.9  MCHC 33.2 32.5  RDW 13.2 12.8  PLT 237 185    Cardiac EnzymesNo results for input(s): TROPONINI in the last 168 hours. No results for input(s): TROPIPOC in the last 168 hours.   BNPNo results for input(s): BNP, PROBNP in the last 168 hours.   DDimer No results for input(s): DDIMER in the last 168 hours.   Radiology    No results found.  Cardiac Studies   LHC:   1. Significant single-vessel coronary artery disease, with sequential 30% and 60% ostial and mid LAD stenosis that are hemodynamically significant by FFR (FFR 0.70). 2. Moderate to severe ostial disease involving small first  and moderate caliber second diagonal branches. 3. Normal left ventricular contraction and filling pressure. 4. Mild aortic stenosis. 5. Successful PCI to mid LAD with placement of Xience Sierra 3.5 x 15 mm drug-eluting stent with 0% residual stenosis and TIMI-3 flow. 6. Successful angioplasty to ostium of D2 due to plaque shift during PCI to LAD, with 50% residual stenosis and TIMI-3 flow.    Patient Profile     66 y.o. female   Assessment & Plan    1. CAD - moderate LAD disease on a cath in 12/2012, we have been managing medically for the last 4 years however worsening chest pressure on exertion, CTA showed moderate lesion with CT FFR 0.76, invasive FFR 0.73,  S/P successful PCI to mid LAD with placement of Xience Sierra 3.5 x 15 mm drug-eluting stent with 0% residual stenosis and TIMI-3 flow. Successful angioplasty to ostium of D2 due to plaque shift during PCI to LAD, with 50% residual stenosis and TIMI-3 flow.  Plavix was added to her regimen.   2. Mild aortic stenosis and aortic regurgitation on echocardiogram in 2018 aortic  stenosis is now moderate with mean gradient 27 mmHg.  3. Chronic diastolic HF - compensated. Continue with current regimen. Lasix when necessary she hasn't used it in years.  4. Abnormal cardiac CT - lymphoid tissue or coronary CT however a followup CT chest showed only 2 mm lung nodule. No lymphadenopathy inhaler or mediastinal region. No mass in the neck area. Repeat chest CT showed stable lesion and doesn't need to be repeated for this indication.   5. Hyperlipidemia, all lipids repeated by her primary care physician in July of this year old lipids at goal. Normal LFTs. Atorvastatin was increased to 80 mg PO daily.  6. Hypertension, well controlled.  For questions or updates, please contact [facility] Please consult [email or web address] for contact info under Cardiology/STEMI.      Signed, Tobias Alexander, MD  05/04/2017, 9:42 AM

## 2017-05-04 NOTE — Telephone Encounter (Signed)
TOC Pt-Please call Pt-Pt has an appointment on 05-11-17 with Boyce Medici.

## 2017-05-04 NOTE — Discharge Instructions (Signed)
Heart Healthy diet  Call [facility] at [phone] if any bleeding, swelling or drainage at cath site.  May shower, no tub baths for 48 hours for groin sticks. No lifting over 5 pounds for 3 days.  No Driving for 3 days  Do Not stop Plavix and Asprin they help keep stents open.

## 2017-05-07 ENCOUNTER — Telehealth (HOSPITAL_COMMUNITY): Payer: Self-pay

## 2017-05-07 ENCOUNTER — Other Ambulatory Visit: Payer: Self-pay

## 2017-05-07 MED ORDER — NITROGLYCERIN 0.4 MG SL SUBL: 0.4000 mg | SUBLINGUAL_TABLET | SUBLINGUAL | 5 refills | Status: AC | PRN

## 2017-05-07 NOTE — Telephone Encounter (Signed)
Patient contacted regarding discharge from [facility] on 05/04/17.  Patient understands to follow up with provider Boyce Medici, PA-c on 05/09/17 at 9:30 at [address] [facility] [address] in [place]. Patient understands discharge instructions? Yes Patient understands medications and regiment? Yes Patient understands to bring all medications to this visit? Yes  The pt states that she is feeling well today and has no complaints. She does have [facility]'s phone number to call if she has any questions.

## 2017-05-07 NOTE — Telephone Encounter (Signed)
Verified Medicare Part B Insurance. No co-payment, Deductible amount is $183.00/$183.00 has been met, No out of pocket, 20% co-insurance, no pre-authorization is required. Passport/Reference # [id number]  Verified [employer]. No co-payment, no deductible, no out of pocket amount, no co-insurance. No pre-authorization is required. Passport/reference # [id number]

## 2017-05-08 ENCOUNTER — Telehealth (HOSPITAL_COMMUNITY): Payer: Self-pay

## 2017-05-08 NOTE — Telephone Encounter (Signed)
I called and left message on voicemail to call office about scheduling for cardiac rehab. I left office contact information on patient voicemail to return call.  ° °

## 2017-05-09 ENCOUNTER — Encounter: Payer: Self-pay | Admitting: Cardiology

## 2017-05-09 ENCOUNTER — Other Ambulatory Visit: Payer: Self-pay | Admitting: *Deleted

## 2017-05-09 ENCOUNTER — Ambulatory Visit (INDEPENDENT_AMBULATORY_CARE_PROVIDER_SITE_OTHER): Payer: Medicare Other | Admitting: Cardiology

## 2017-05-09 VITALS — BP 102/54 | HR 60 | Ht 62.0 in | Wt 164.0 lb

## 2017-05-09 DIAGNOSIS — Z9861 Coronary angioplasty status: Secondary | ICD-10-CM | POA: Diagnosis not present

## 2017-05-09 DIAGNOSIS — I2510 Atherosclerotic heart disease of native coronary artery without angina pectoris: Secondary | ICD-10-CM

## 2017-05-09 NOTE — Progress Notes (Signed)
05/09/2017 Kerry Perez   February 25, 1951  [id number]  Primary Physician Shaune Pollack, MD Primary Cardiologist: Dr. Delton See  Reason for Visit/CC: [facility] F/u for CAD s/p PCI  HPI:  66year old female with a history of CAD, HTN, HLD, diet controlled DM, OA, GERD, positive family history for CAD who presents to clinic today for post hospital f/u. She is followed by Dr. Delton See. She was recently evaluated with a coronary CTA that was abnormal, suggestive of LAD disease. CT FFR analysis showed hemodynamically significant stenosis in the mid LAD. Subsequently, she was referred for [facility]. This was performed by Dr. Okey Dupre on 05/03/17. She was found to have significant single-vessel coronary artery disease, with sequential 30% and 60% ostial and mid LAD stenosis that are hemodynamically significant by FFR (FFR 0.70). Moderate to severe ostial disease involving small first and moderate caliber second diagonal branches. Normal left ventricular contraction and filling pressure. Mild aortic stenosis.  She underwent successful PCI to mid LAD with placement of Xience Sierra 3.5 x 15 mm drug-eluting stent with 0% residual stenosis and TIMI-3 flow and successful angioplasty to ostium of D2 due to plaque shift during PCI to LAD, with 50% residual stenosis and TIMI-3 flow. She was placed on DAPT with ASA and Plavix. She was continued on Lipitor, Zetia, metoprolol and Hyzaar. Recent LDL was close to goal at 71 mg/dL.   She presents to clinic today for post hospital f/u. She has done well. No ischemic chest pain and no dyspnea. She has been walking for exercise. She walks up to 40 min. There are hills on her walking path. She denies any exertional symptoms. Radial cath site is stable. BP and HR stable. She reports full med compliance. No intolerances.   Current Meds  Medication Sig  . acetaminophen (TYLENOL) 325 MG tablet Take 650 mg by mouth every 6 (six) hours as needed for mild pain or headache.   Marland Kitchen aspirin 81  MG tablet Take 81 mg by mouth daily.  Marland Kitchen atorvastatin (LIPITOR) 80 MG tablet Take 1 tablet (80 mg total) by mouth daily.  Marland Kitchen CALCIUM PO Take 1 tablet by mouth daily.  . Cholecalciferol (VITAMIN D) 2000 UNITS CAPS Take 1 capsule by mouth daily.   . clopidogrel (PLAVIX) 75 MG tablet Take 1 tablet (75 mg total) by mouth daily with breakfast.  . ezetimibe (ZETIA) 10 MG tablet Take 1 tablet (10 mg total) by mouth daily.  . furosemide (LASIX) 40 MG tablet Take 1 tablet (40 mg total) by mouth daily as needed (shortness of breath, fluid weight gain or fluid retention).  Marland Kitchen losartan-hydrochlorothiazide (HYZAAR) 100-25 MG tablet Take 1 tablet by mouth daily.  . metoprolol succinate (TOPROL-XL) 25 MG 24 hr tablet TAKE 1 TABLET (25 MG TOTAL) BY MOUTH DAILY.  . Multiple Vitamins-Minerals (MULTIVITAMIN PO) Take 1 tablet by mouth daily.   . nitroGLYCERIN (NITROSTAT) 0.4 MG SL tablet Place 1 tablet (0.4 mg total) under the tongue every 5 (five) minutes as needed for chest pain (up to 3 doses).  . ONE TOUCH ULTRA TEST test strip USE TO TEST BLOOD SUGAR ONCE A DAY   Allergies  Allergen Reactions  . Lisinopril Other (See Comments)    Lips swelled   Past Medical History:  Diagnosis Date  . Abnormal cardiac CT angiography    a. Ancillary findings: prominent hilar lymphoid tissue nonspecific - possibly related to CHF and lymphatic congestion, clinical correlation for signs/sx of ymphoproliferative disorder or other malignancy is suggested - denied constitutional  sx and will f/u with PCP.  Marland Kitchen Anemia   . Aortic stenosis    a. Mild by echo 10/2013.  Marland Kitchen Arthritis    "knees" (05/03/2017)   . CAD (coronary artery disease)    a. Moderate LAD lesion by cath 10/15/13 with nonischemic nuclear stress test 10/17/13  . Diastolic CHF (HCC)    a. Dx 10/2013 - normal EF but elevated LVEDP by cath.  . GERD (gastroesophageal reflux disease)    mild  . Heart failure (HCC)   . Heart murmur   . History of blood transfusion 1965    "related to OR"  . Hypercholesterolemia   . Hypertension   . Migraine    "they've faded out; might have 1/year; if that; not as strong as when I was a kid" (05/03/2017)  . S/P angioplasty with stent 05/03/17 to mLAD with DES and ostium of D2 with 50% residual stenosis.   05/04/2017  . Type 2 diabetes, diet controlled (HCC)    Family History  Problem Relation Age of Onset  . Hypertension Mother   . Heart disease Mother   . Osteoporosis Sister    Past Surgical History:  Procedure Laterality Date  . APPENDECTOMY  1965  . BREAST BIOPSY Right ~ 2000; ~ 2002"   "both benign"  . CARDIAC CATHETERIZATION    . CORONARY ANGIOPLASTY WITH STENT PLACEMENT  05/03/2017  . EXPLORATORY LAPAROTOMY  1965   Hairball removed from stomach  . INTRAVASCULAR PRESSURE WIRE/FFR STUDY N/A 05/03/2017   Procedure: INTRAVASCULAR PRESSURE WIRE/FFR STUDY;  Surgeon: [name], MD;  [facility]: [facility];  Service: Cardiovascular;  Laterality: N/A;  . KNEE ARTHROSCOPY Left 06/18/2013   Procedure: LEFT KNEE ARTHROSCOPY WITH DEBRIDEMENT;  Surgeon: Loanne Drilling, MD;  Location: [facility] OR[facility];  Service: Orthopedics;  Laterality: Left;  . LEFT HEART CATH AND CORONARY ANGIOGRAPHY N/A 05/03/2017   Procedure: LEFT HEART CATH AND CORONARY ANGIOGRAPHY;  Surgeon: [name], MD;  Location: [facility];  Service: Cardiovascular;  Laterality: N/A;  . LEFT HEART CATHETERIZATION WITH CORONARY ANGIOGRAM N/A 10/15/2013   Procedure: LEFT HEART CATHETERIZATION WITH CORONARY ANGIOGRAM;  Surgeon: Iran Ouch, MD;  Location: [facility] CATH LAB;  Service: Cardiovascular;  Laterality: N/A;   Social History   Social History  . Marital status: Married    Spouse name: N/A  . Number of children: N/A  . Years of education: N/A   Occupational History  . Not on file.   Social History Main Topics  . Smoking status: Never Smoker  . Smokeless tobacco: Never Used  . Alcohol use 4.2 oz/week    7 Glasses of wine per week  .  Drug use: No  . Sexual activity: Not Currently    Partners: Male    Birth control/ protection: Post-menopausal   Other Topics Concern  . Not on file   Social History Narrative  . No narrative on file     Review of Systems: General: negative for chills, fever, night sweats or weight changes.  Cardiovascular: negative for chest pain, dyspnea on exertion, edema, orthopnea, palpitations, paroxysmal nocturnal dyspnea or shortness of breath Dermatological: negative for rash Respiratory: negative for cough or wheezing Urologic: negative for hematuria Abdominal: negative for nausea, vomiting, diarrhea, bright red blood per rectum, melena, or hematemesis Neurologic: negative for visual changes, syncope, or dizziness All other systems reviewed and are otherwise negative except as noted above.   Physical Exam:  Blood pressure (!) 102/54, pulse 60, height  (1.575  m), weight 164 lb (74.4 kg), last menstrual period 08/07/1998, SpO2 99 %.  General appearance: alert, cooperative and no distress Neck: no carotid bruit and no JVD Lungs: clear to auscultation bilaterally Heart: regular rate and rhythm, 1/6 SM at RUSB Extremities: extremities normal, atraumatic, no cyanosis or edema Pulses: 2+ and symmetric Skin: Skin color, texture, turgor normal. No rashes or lesions Neurologic: Grossly normal  EKG not performed -- personally reviewed   ASSESSMENT AND PLAN:   1. CAD: s/p PCI + DES to mid LAD and successful angioplasty to ostium of D2 due to plaque shift during PCI to LAD, with 50% residual stenosis (medical therapy). Stable w/o cardiac chest pain. Continue medical therapy. DAPT with ASA + Plavix for a minimum of 1 year given DES. Continue statin, BB and ARB.   2. Lipids: controlled on current therapy. LDL 71 and HDL good at 87. Continue Lipitor and Zetia.   3. HTN: controlled on current regimen.   4. DM: diet controlled. Recent Hgb A1c 6.3.   5. Aortic Stenosis: mild by recent cath  measurement. Dr. Delton See will continue to follow.   Follow-Up w/ Dr. Delton See in 3 months.   Zeriah Baysinger Delmer Islam, MHS [facility] 05/09/2017 9:39 AM

## 2017-05-09 NOTE — Patient Instructions (Addendum)
Medication Instructions:  Your physician recommends that you continue on your current medications as directed. Please refer to the Current Medication list given to you today.  Labwork: NONE  Testing/Procedures: NONE  Follow-Up: Your physician wants you to follow-up in: 3 months with Dr. Nelson.    If you need a refill on your cardiac medications before your next appointment, please call your pharmacy.    

## 2017-05-15 ENCOUNTER — Encounter (HOSPITAL_COMMUNITY): Payer: Self-pay

## 2017-05-21 ENCOUNTER — Telehealth (HOSPITAL_COMMUNITY): Payer: Self-pay

## 2017-05-21 DIAGNOSIS — H3561 Retinal hemorrhage, right eye: Secondary | ICD-10-CM | POA: Diagnosis not present

## 2017-05-21 NOTE — Telephone Encounter (Signed)
Patient returned phone call in regards to Cardiac Rehab. Patient stated she will be going out of town the whole month of December. Returning the first week of January. Will f/u first week of January.

## 2017-05-21 NOTE — Telephone Encounter (Signed)
Called patient to discuss cardiac rehab - left voicemail to return call.

## 2017-08-08 ENCOUNTER — Telehealth (HOSPITAL_COMMUNITY): Payer: Self-pay

## 2017-08-08 NOTE — Telephone Encounter (Signed)
Attempted to call patient in regards to Cardiac Rehab - Lm on Vm °

## 2017-08-13 ENCOUNTER — Ambulatory Visit (INDEPENDENT_AMBULATORY_CARE_PROVIDER_SITE_OTHER): Payer: Medicare Other | Admitting: Cardiology

## 2017-08-13 ENCOUNTER — Telehealth (HOSPITAL_COMMUNITY): Payer: Self-pay | Admitting: *Deleted

## 2017-08-13 ENCOUNTER — Encounter: Payer: Self-pay | Admitting: Cardiology

## 2017-08-13 VITALS — BP 120/60 | HR 64 | Ht 62.0 in | Wt 165.2 lb

## 2017-08-13 DIAGNOSIS — I2583 Coronary atherosclerosis due to lipid rich plaque: Secondary | ICD-10-CM

## 2017-08-13 DIAGNOSIS — I5031 Acute diastolic (congestive) heart failure: Secondary | ICD-10-CM | POA: Diagnosis not present

## 2017-08-13 DIAGNOSIS — I350 Nonrheumatic aortic (valve) stenosis: Secondary | ICD-10-CM | POA: Diagnosis not present

## 2017-08-13 DIAGNOSIS — Z9861 Coronary angioplasty status: Secondary | ICD-10-CM | POA: Diagnosis not present

## 2017-08-13 DIAGNOSIS — I10 Essential (primary) hypertension: Secondary | ICD-10-CM | POA: Diagnosis not present

## 2017-08-13 DIAGNOSIS — E7800 Pure hypercholesterolemia, unspecified: Secondary | ICD-10-CM

## 2017-08-13 DIAGNOSIS — I2510 Atherosclerotic heart disease of native coronary artery without angina pectoris: Secondary | ICD-10-CM | POA: Diagnosis not present

## 2017-08-13 MED ORDER — METOPROLOL SUCCINATE ER 25 MG PO TB24: 25.0000 mg | ORAL_TABLET | Freq: Every day | ORAL | 3 refills | Status: DC

## 2017-08-13 NOTE — Telephone Encounter (Signed)
Returned pt call from message left earlier for scheduling for cardiac rehab.  Contact information provided. Alanson Alyarlette Rana Adorno RN, BSN Cardiac and Emergency planning/management officerulmonary Rehab Nurse Navigator

## 2017-08-13 NOTE — Patient Instructions (Signed)
Medication Instructions:  Your physician recommends that you continue on your current medications as directed. Please refer to the Current Medication list given to you today.  Labwork: None ordered  Testing/Procedures: None ordered   Follow-Up: Your physician recommends that you schedule a follow-up appointment in: 4 months with Dr. Delton SeeNelson   Any Other Special Instructions Will Be Listed Below (If Applicable).     If you need a refill on your cardiac medications before your next appointment, please call your pharmacy.

## 2017-08-13 NOTE — Progress Notes (Signed)
Patient ID: Kerry Perez, female   DOB: 07-Sep-1950, 67 y.o.   MRN: [id number]    Chief complain: follow up for CAD, aortic stenosis  History of Present Illness: Kerry Perez is a 67 year old female with a history of HTN, HLD, diet controlled DM, OA, GERD, positive family history for CAD who was evaluated in the office for chest pain and dyspnea in 2015. Cardiac Ct showed calcium score of 144 and one vessel CAD in the LAD.  She was then referred for cardiac cath - this showed a moderate LAD lesion with a nonischemic nuclear stress test. Managed medically.  Does have diastolic dysfunction - EF is normal at 60 to 65%. Her statin was increased. She was also noted to have mild AS by echo and normocytic anemia on her labs. The cardiac CT also showed some prominent lymphoid tissue - no constitutional symptoms - asked to follow up with PCP. She remains very active always walking over 10,000 steps a day. Despite that 2 years later her symptoms of his exertional dyspnea and pressure was worsening and she was referred for another coronary CTA. Cardiac CTA in September 2018 showed progression of coronary calcium score to 384. This was 32 percentile for age and sex matched control. Calcium score 144 in 10/2013. Moderate plaque in the proximal to mid LAD and at least moderate plaque in the proximal LCX artery. Additional analysis with CT FFR will be performed. Proximal LAD CT FFR: 0.89. Mid LAD CT FFR: 0.84. Mid to distal LAD CT FFR: 079. The patient underwent cardiac catheterization with placement of stent to mid LAD and bowel angioplasty of diagonal branch.  Today she states that she continues to walk and dance couple times a week. Walking continues to give her shortness of breath and sometimes chest pressure this is unchanged from before coronary intervention. When she dances she states that her mind is preoccupied and she doesn't have any symptoms. She denies any palpitations dizziness or syncope.     Allergies  Allergen Reactions  . Lisinopril Other (See Comments)    Lips swelled   Past Medical History:  Diagnosis Date  . Abnormal cardiac CT angiography    a. Ancillary findings: prominent hilar lymphoid tissue nonspecific - possibly related to CHF and lymphatic congestion, clinical correlation for signs/sx of ymphoproliferative disorder or other malignancy is suggested - denied constitutional sx and will f/u with PCP.  Marland Kitchen Anemia   . Aortic stenosis    a. Mild by echo 10/2013.  Marland Kitchen Arthritis    "knees" (05/03/2017)   . CAD (coronary artery disease)    a. Moderate LAD lesion by cath 10/15/13 with nonischemic nuclear stress test 10/17/13  . Diastolic CHF (HCC)    a. Dx 10/2013 - normal EF but elevated LVEDP by cath.  . GERD (gastroesophageal reflux disease)    mild  . Heart failure (HCC)   . Heart murmur   . History of blood transfusion 1965   "related to OR"  . Hypercholesterolemia   . Hypertension   . Migraine    "they've faded out; might have 1/year; if that; not as strong as when I was a kid" (05/03/2017)  . S/P angioplasty with stent 05/03/17 to mLAD with DES and ostium of D2 with 50% residual stenosis.   05/04/2017  . Type 2 diabetes, diet controlled (HCC)    Past Surgical History:  Procedure Laterality Date  . APPENDECTOMY  1965  . BREAST BIOPSY Right ~ 2000; ~ 2002"   "both  benign"  . CARDIAC CATHETERIZATION    . CORONARY ANGIOPLASTY WITH STENT PLACEMENT  05/03/2017  . EXPLORATORY LAPAROTOMY  1965   Hairball removed from stomach  . INTRAVASCULAR PRESSURE WIRE/FFR STUDY N/A 05/03/2017   Procedure: INTRAVASCULAR PRESSURE WIRE/FFR STUDY;  Surgeon: [name], MD;  Location: [facility];  Service: Cardiovascular;  Laterality: N/A;  . KNEE ARTHROSCOPY Left 06/18/2013   Procedure: LEFT KNEE ARTHROSCOPY WITH DEBRIDEMENT;  Surgeon: Loanne Drilling, MD;  Location: [facility];  Service: Orthopedics;  Laterality: Left;  . LEFT HEART CATH AND CORONARY ANGIOGRAPHY N/A 05/03/2017    Procedure: LEFT HEART CATH AND CORONARY ANGIOGRAPHY;  Surgeon: [name], MD;  Location: [facility];  Service: Cardiovascular;  Laterality: N/A;  . LEFT HEART CATHETERIZATION WITH CORONARY ANGIOGRAM N/A 10/15/2013   Procedure: LEFT HEART CATHETERIZATION WITH CORONARY ANGIOGRAM;  Surgeon: Iran Ouch, MD;  Location: [facility]ATH [facility];  Service: Cardiovascular;  Laterality: N/A;    Social History   Tobacco Use  Smoking Status Never Smoker  Smokeless Tobacco Never Used    Social History   Substance and Sexual Activity  Alcohol Use Yes  . Alcohol/week: 4.2 oz  . Types: 7 Glasses of wine per week   Family History  Problem Relation Age of Onset  . Hypertension Mother   . Heart disease Mother   . Osteoporosis Sister    Review of Systems: The review of systems is per the HPI.  All other systems were reviewed and are negative.  Physical Exam: BP 120/60   Pulse 64   Ht 5\' 2"  (1.575 m)   Wt 165 lb 3.2 oz (74.9 kg)   LMP 08/07/1998   SpO2 98%   BMI 30.22 kg/m  Patient is very pleasant and in no acute distress. [place] accent. She is obese. Skin is warm and dry. Color is normal.  HEENT is unremarkable. Normocephalic/atraumatic. PERRL. Sclera are nonicteric. Neck is supple. No masses. No JVD. Lungs are clear. Cardiac exam shows a regular rate and rhythm. She has a 3/6 systolic murmur,  Abdomen is soft. Extremities are without edema. Gait and ROM are intact. No gross neurologic deficits noted.  Wt Readings from Last 3 Encounters:  08/13/17 165 lb 3.2 oz (74.9 kg)  05/09/17 164 lb (74.4 kg)  05/04/17 169 lb (76.7 kg)   LABORATORY DATA: EKG with sinus brady - unchanged. Reviewed with Dr. Delton See  Lab Results  Component Value Date   WBC 5.4 05/04/2017   HGB 11.0 (L) 05/04/2017   HCT 33.8 (L) 05/04/2017   PLT 185 05/04/2017   GLUCOSE 93 05/04/2017   CHOL 156 08/13/2015   TRIG 56 08/13/2015   HDL 87 08/13/2015   LDLCALC 58 08/13/2015   ALT 16 08/13/2015   AST 24  08/13/2015   NA 137 05/04/2017   K 3.6 05/04/2017   CL 102 05/04/2017   CREATININE 0.76 05/04/2017   BUN 16 05/04/2017   CO2 27 05/04/2017   TSH 1.734 08/13/2015   INR 1.0 04/30/2017   Echo Study Conclusions  - Left ventricle: The cavity size was normal. Wall thickness was increased in a pattern of mild LVH. Systolic function was normal. The estimated ejection fraction was in the range of 60% to 65%. Wall motion was normal; there were no regional wall motion abnormalities. Left ventricular diastolic function parameters were normal. - Aortic valve: There was very mild stenosis. Mild regurgitation. Valve area: 1.73cm^2(VTI). Valve area: 1.67cm^2 (Vmax). - Right atrium: The atrium was mildly dilated.  Coronary angiography:  Coronary dominance: right  Left Main: normal  Left Anterior Descending (LAD): Normal in size and mildly calcified. There is a 50-60% discrete stenosis after the origin of second diagonal. The rest of the midsegment has minor irregularities. There is mild myocardial bridging noted in the midsegment.  1st diagonal (D1): Normal in size with 30% ostial stenosis.  2nd diagonal (D2): Normal in size with 20% ostial stenosis.  3rd diagonal (D3): Small in size with minor irregularities.  Circumflex (LCx): Normal in size and nondominant. The vessel has minor irregularities.  1st obtuse marginal: Very small in size.  2nd obtuse marginal: Normal in size with no significant disease.  3rd obtuse marginal: Normal in size with minor irregularities.  Right Coronary Artery: normal in size and dominant. The stent percent proximal stenosis and 20% mid stenosis.  Posterior descending artery: normal in size with no significant disease.  Posterior AV segment: normal in size with no significant disease.  Posterolateral branchs: No significant disease. Left ventriculography: Left ventricular systolic function is normal , LVEF is estimated at 60 %, there is no significant mitral  regurgitation  Final Conclusions:  1. Moderate mid LAD stenosis with mild mid myocardial bridging.  2.Normal LV systolic function with mildly elevated left ventricular end-diastolic pressure.  Recommendations:  I requested an echocardiogram. Recommend initial medical therapy. If the patient fails medical therapy, consider functional testing or pressure wire interrogation of the LAD  Lorine BearsMuhammad Arida MD, [facility] At Redbird SquareFACC  10/15/2013, 7:47 PM  Chest CT 10/2013 IMPRESSION:  No evidence for mediastinal or hilar lymphadenopathy.  Probable bilateral tiny calcified granulomata. As the 2 mm nodule in  the left lower lobe cannot be definitely seen to be calcified,  followup by consensus criteria is suggested. If the patient is at  high risk for bronchogenic carcinoma, follow-up chest CT at 1 year  is recommended. If the patient is at low risk, no follow-up is  needed. This recommendation follows the consensus statement:  Guidelines for Management of Small Pulmonary Nodules Detected on CT  Scans: A Statement from the Fleischner Society as published in  Radiology 2005; 237:395-400.  Electronically Signed  By: Kennith CenterEric Mansell M.D.  On: 11/03/2013 12:06  CARDIAC CT IMPRESSION: 1. The appearance of the lungs suggests mild interstitial pulmonary edema. Given the presence of bilateral pleural effusions, findings suggest congestive heart failure. 2. Prominent lymphoid tissue in the hilar regions bilaterally is nonspecific. In the setting of potential congestive failure, this may be related to edema and lymphatic congestion, however, clinical correlation for signs and symptoms of the lymphoproliferative disorder or other malignancy is suggested.  Electronically Signed By: Trudie Reedaniel Entrikin M.D. On: 10/16/2013 18:18  IMPRESSION: 1. Coronary calcium score of 144. This was 5696 percentile for age and sex matched control.  2. Normal origin or coronary arteries, right dominance.  3. One vessel coronary artery  disease with significant lesions in the mid and possibly proximal LAD. Cardiac catheterization is recommended.  Tobias AlexanderKatarina Marielis Samara   TTE: 06/28/2016 Impression:  1. Left ventricular systolic function is normal.  2. Visually estimated ejection fraction is 70%.   3. The left ventricular cavity size is normal by M-Mode.  4. Mild aortic regurgitation.   5. Aortic valve is sclerotic.   6. There were no stress-induced wall motion abnormalities. This is a  negative stress echo test for ischemia at 95 % PMHR.   7. Mild degree of aortic stenosis is present with peak gradient of 27.6   mmHg, mean gradient of 17.0 mmHg and a  calculated valve area of 1.95 cm?.  8. Mild tricuspid regurgitation.  9. With an assumed right atrial pressure of 3 mmHg, the estimated right   ventricular systolic pressure is mildly elevated at 37.1 mmHg.   10. The left atrium is mildly dilated by M-Mode.    EKG performed today 08/13/2017, shows normal sinus rhythm 64 bpm normal EKG unchanged from prior.    Assessment / Plan:  1. CAD - status post PCI to LAD and balloon angioplasty to diagonal branch in October 2018. Patient continues to have exertional dyspnea. We will continue to antiplatelet therapy for one year, Toprol-XL, high dose Lipitor and Zetia.   2. Moderate aortic stenosis and mild aortic regurgitation on echocardiogram in September 2018, mean transaortic gradient 27 mmHg.   3. Chronic diastolic HF - compensated. Continue with current regimen. Lasix when necessary she hasn't used it in years.  4. Abnormal cardiac CT - lymphoid tissue or coronary CT  however a followup CT chest showed only 2 mm lung nodule. No lymphadenopathy inhaler or mediastinal region. No mass in the neck area. Repeat chest CT showed stable lesion and doesn't need to be repeated for this indication.   5. Hyperlipidemia, because of worsening and progression of her coronary artery disease her atorvastatin was increased to 80 mg daily and Zetia was added to her regimen. Her lipids are at goal.  6. Hypertension, well controlled.  Follow up in 6 months.  Tobias Alexander 08/13/2017

## 2017-08-16 ENCOUNTER — Encounter (HOSPITAL_COMMUNITY): Payer: Self-pay

## 2017-08-16 ENCOUNTER — Telehealth (HOSPITAL_COMMUNITY): Payer: Self-pay

## 2017-08-16 NOTE — Telephone Encounter (Signed)
2nd attempt to call patient in regards to Cardiac Rehab - lm on vm. Sending letter. °

## 2017-08-20 ENCOUNTER — Ambulatory Visit (INDEPENDENT_AMBULATORY_CARE_PROVIDER_SITE_OTHER): Payer: Medicare Other | Admitting: Obstetrics and Gynecology

## 2017-08-20 ENCOUNTER — Other Ambulatory Visit: Payer: Self-pay

## 2017-08-20 ENCOUNTER — Encounter: Payer: Self-pay | Admitting: Obstetrics and Gynecology

## 2017-08-20 VITALS — BP 140/62 | HR 64 | Resp 16 | Ht 61.5 in | Wt 165.0 lb

## 2017-08-20 DIAGNOSIS — Z01419 Encounter for gynecological examination (general) (routine) without abnormal findings: Secondary | ICD-10-CM | POA: Diagnosis not present

## 2017-08-20 DIAGNOSIS — Z124 Encounter for screening for malignant neoplasm of cervix: Secondary | ICD-10-CM | POA: Diagnosis not present

## 2017-08-20 NOTE — Progress Notes (Signed)
67 y.o. [id number] MarriedCaucasianF here for annual exam.  No vaginal bleeding. Not sexually active.  She had a angioplasty with stent in 9/18 for CAD. She is doing well.  She has problems with her knees. She needs a replacement on the left, thinks the right is getting worse as well. She is on Plavix because of the Stent. Will likely be on it until next 9/19.     Patient's last menstrual period was 08/07/1998.          Sexually active: No.  The current method of family planning is post menopausal status.    Exercising: Yes.    walking/ clog dancing Smoker:  no  Health Maintenance: Pap:  08-17-16 WNL 04-28-13 WNL NEG HR HPV  History of abnormal Pap:  no MMG:  09-27-16 WNL  Colonoscopy:  2010 normal per patient  BMD:  Unsure, done with primary, was told mild osteopenia. She will check when she is due for another DEXA TDaP:  2009, she will do with her primary.  Gardasil: N/A   reports that  has never smoked. she has never used smokeless tobacco. She reports that she drinks about 2.4 oz of alcohol per week. She reports that she does not use drugs. Both her sons live in [place], Daughter [place] in [place], [place]. 2 grand children in [place].  She and her husband are considering moving to [place] Daughter got married this year.   Past Medical History:  Diagnosis Date  . Abnormal cardiac CT angiography    a. Ancillary findings: prominent hilar lymphoid tissue nonspecific - possibly related to CHF and lymphatic congestion, clinical correlation for signs/sx of ymphoproliferative disorder or other malignancy is suggested - denied constitutional sx and will f/u with PCP.  Marland Kitchen. Anemia   . Aortic stenosis    a. Mild by echo 10/2013.  Marland Kitchen. Arthritis    "knees" (05/03/2017)   . CAD (coronary artery disease)    a. Moderate LAD lesion by cath 10/15/13 with nonischemic nuclear stress test 10/17/13  . Diastolic CHF (HCC)    a. Dx 10/2013 - normal EF but elevated LVEDP by cath.  . GERD (gastroesophageal reflux  disease)    mild  . Heart failure (HCC)   . Heart murmur   . History of blood transfusion 1965   "related to OR"  . Hypercholesterolemia   . Hypertension   . Migraine    "they've faded out; might have 1/year; if that; not as strong as when I was a kid" (05/03/2017)  . S/P angioplasty with stent 05/03/17 to mLAD with DES and ostium of D2 with 50% residual stenosis.   05/04/2017  . Type 2 diabetes, diet controlled (HCC)     Past Surgical History:  Procedure Laterality Date  . APPENDECTOMY  1965  . BREAST BIOPSY Right ~ 2000; ~ 2002"   "both benign"  . CARDIAC CATHETERIZATION    . CORONARY ANGIOPLASTY WITH STENT PLACEMENT  05/03/2017  . EXPLORATORY LAPAROTOMY  1965   Hairball removed from stomach  . INTRAVASCULAR PRESSURE WIRE/FFR STUDY N/A 05/03/2017   Procedure: INTRAVASCULAR PRESSURE WIRE/FFR STUDY;  Surgeon: Yvonne KendallEnd, Christopher, MD;  Location: [facility];  Service: Cardiovascular;  Laterality: N/A;  . KNEE ARTHROSCOPY Left 06/18/2013   Procedure: LEFT KNEE ARTHROSCOPY WITH DEBRIDEMENT;  Surgeon: Loanne DrillingFrank V Aluisio, MD;  Location: [facility];  Service: Orthopedics;  Laterality: Left;  . LEFT HEART CATH AND CORONARY ANGIOGRAPHY N/A 05/03/2017   Procedure: LEFT HEART CATH AND CORONARY ANGIOGRAPHY;  Surgeon: End,  Cristal Deer, MD;  Location: [facility];  Service: Cardiovascular;  Laterality: N/A;  . LEFT HEART CATHETERIZATION WITH CORONARY ANGIOGRAM N/A 10/15/2013   Procedure: LEFT HEART CATHETERIZATION WITH CORONARY ANGIOGRAM;  Surgeon: Iran Ouch, MD;  Location: [facility];  Service: Cardiovascular;  Laterality: N/A;    Current Outpatient Medications  Medication Sig Dispense Refill  . acetaminophen (TYLENOL) 325 MG tablet Take 650 mg by mouth every 6 (six) hours as needed for mild pain or headache.     Marland Kitchen aspirin 81 MG tablet Take 81 mg by mouth daily.    Marland Kitchen atorvastatin (LIPITOR) 80 MG tablet Take 1 tablet (80 mg total) by mouth daily. 90 tablet 2  . CALCIUM PO Take 1 tablet  by mouth daily.    . Cholecalciferol (VITAMIN D) 2000 UNITS CAPS Take 1 capsule by mouth daily.     . clopidogrel (PLAVIX) 75 MG tablet Take 1 tablet (75 mg total) by mouth daily with breakfast. 30 tablet 11  . ezetimibe (ZETIA) 10 MG tablet Take 1 tablet (10 mg total) by mouth daily. 90 tablet 2  . furosemide (LASIX) 40 MG tablet Take 1 tablet (40 mg total) by mouth daily as needed (shortness of breath, fluid weight gain or fluid retention). 30 tablet 11  . losartan-hydrochlorothiazide (HYZAAR) 100-25 MG tablet Take 1 tablet by mouth daily.    . metoprolol succinate (TOPROL-XL) 25 MG 24 hr tablet Take 1 tablet (25 mg total) by mouth daily. 90 tablet 3  . Multiple Vitamins-Minerals (MULTIVITAMIN PO) Take 1 tablet by mouth daily.     . nitroGLYCERIN (NITROSTAT) 0.4 MG SL tablet Place 1 tablet (0.4 mg total) under the tongue every 5 (five) minutes as needed for chest pain (up to 3 doses). 25 tablet 5  . ONE TOUCH ULTRA TEST test strip USE TO TEST BLOOD SUGAR ONCE A DAY  3   No current facility-administered medications for this visit.     Family History  Problem Relation Age of Onset  . Hypertension Mother   . Heart disease Mother   . Osteoporosis Sister     Review of Systems  Constitutional: Negative.   HENT: Negative.   Eyes: Negative.   Respiratory: Negative.   Cardiovascular: Negative.   Gastrointestinal: Negative.   Endocrine: Negative.   Genitourinary: Negative.   Musculoskeletal: Negative.   Skin: Negative.   Allergic/Immunologic: Negative.   Neurological: Negative.   Hematological: Bruises/bleeds easily.  Psychiatric/Behavioral: Negative.     Exam:   BP 140/62 (BP Location: Right Arm, Patient Position: Sitting, Cuff Size: Normal)   Pulse 64   Resp 16   Ht 5' 1.5" (1.562 m)   Wt 165 lb (74.8 kg)   LMP 08/07/1998   BMI 30.67 kg/m   Weight change: @WEIGHTCHANGE @ Height:   Height: 5' 1.5" (156.2 cm)  Ht Readings from Last 3 Encounters:  08/20/17 5' 1.5" (1.562 m)   08/13/17 5\' 2"  (1.575 m)  05/09/17 5\' 2"  (1.575 m)    General appearance: alert, cooperative and appears stated age Head: Normocephalic, without obvious abnormality, atraumatic Neck: no adenopathy, supple, symmetrical, trachea midline and thyroid normal to inspection and palpation Lungs: clear to auscultation bilaterally Cardiovascular: regular rate and rhythm, grade 3/5 SEM Breasts: normal appearance, no masses or tenderness, evidence of biopsy on the right Abdomen: soft, non-tender; non distended,  no masses,  no organomegaly Extremities: extremities normal, atraumatic, no cyanosis or edema Skin: Skin color, texture, turgor normal. No rashes or lesions Lymph nodes: Cervical, supraclavicular,  and axillary nodes normal. No abnormal inguinal nodes palpated Neurologic: Grossly normal   Pelvic: External genitalia:  no lesions              Urethra:  normal appearing urethra with no masses, tenderness or lesions              Bartholins and Skenes: normal                 Vagina: normal appearing atrophic vagina with normal color and discharge, no lesions              Cervix: no lesions               Bimanual Exam:  Uterus:  normal size, contour, position, consistency, mobility, non-tender              Adnexa: no mass, fullness, tenderness               Rectovaginal: Confirms               Anus:  normal sphincter tone, no lesions  Chaperone was present for exam.  A:  Well Woman with normal exam  P:   No pap this year  Mammogram next month  Colonoscopy UTD  She will check with her primary as to when her DEXA is due  Discussed breast self exam  Discussed calcium and vit D intake  TDAP with her primary

## 2017-08-20 NOTE — Patient Instructions (Signed)

## 2017-09-10 DIAGNOSIS — E669 Obesity, unspecified: Secondary | ICD-10-CM | POA: Diagnosis not present

## 2017-09-10 DIAGNOSIS — I350 Nonrheumatic aortic (valve) stenosis: Secondary | ICD-10-CM | POA: Diagnosis not present

## 2017-09-10 DIAGNOSIS — M810 Age-related osteoporosis without current pathological fracture: Secondary | ICD-10-CM | POA: Diagnosis not present

## 2017-09-10 DIAGNOSIS — Z23 Encounter for immunization: Secondary | ICD-10-CM | POA: Diagnosis not present

## 2017-09-10 DIAGNOSIS — H9190 Unspecified hearing loss, unspecified ear: Secondary | ICD-10-CM | POA: Diagnosis not present

## 2017-09-10 DIAGNOSIS — I700 Atherosclerosis of aorta: Secondary | ICD-10-CM | POA: Diagnosis not present

## 2017-09-10 DIAGNOSIS — I5032 Chronic diastolic (congestive) heart failure: Secondary | ICD-10-CM | POA: Diagnosis not present

## 2017-09-10 DIAGNOSIS — E119 Type 2 diabetes mellitus without complications: Secondary | ICD-10-CM | POA: Diagnosis not present

## 2017-09-10 DIAGNOSIS — I10 Essential (primary) hypertension: Secondary | ICD-10-CM | POA: Diagnosis not present

## 2017-09-10 DIAGNOSIS — Z0000 Encounter for general adult medical examination without abnormal findings: Secondary | ICD-10-CM | POA: Diagnosis not present

## 2017-09-10 DIAGNOSIS — E7800 Pure hypercholesterolemia, unspecified: Secondary | ICD-10-CM | POA: Diagnosis not present

## 2017-09-10 DIAGNOSIS — I2510 Atherosclerotic heart disease of native coronary artery without angina pectoris: Secondary | ICD-10-CM | POA: Diagnosis not present

## 2017-09-11 ENCOUNTER — Telehealth (HOSPITAL_COMMUNITY): Payer: Self-pay | Admitting: Pharmacist

## 2017-09-14 NOTE — Telephone Encounter (Signed)
Cardiac Rehab - Pharmacy Resident Documentation   Patient unable to be reached after three call attempts. Please complete allergy verification and medication review during patient's cardiac rehab appointment.   Rakia Frayne, PharmD PGY1 Acute Care Pharmacy Resident Pager: [phone]  

## 2017-09-17 ENCOUNTER — Telehealth (HOSPITAL_COMMUNITY): Payer: Self-pay | Admitting: Pharmacist

## 2017-09-17 NOTE — Telephone Encounter (Signed)
Cardiac Rehab Medication Review by a Pharmacist  Does the patient  feel that his/her medications are working for him/her?  Yes, but patient still needed a stent to her surprise.   Has the patient been experiencing any side effects to the medications prescribed? No side effects currently.  Does the patient measure his/her own blood pressure or blood glucose at home?  Yes, it runs around 115-120/60-70s. Blood sugar runs around 100-110 but does not require medication at this time.   Does the patient have any problems obtaining medications due to transportation or finances?   No   Understanding of regimen: good Understanding of indications: good Potential of compliance: good   Pharmacist comments: Patient questioned what was the point of the cardiac rehab phone call and I explained that it is to ensure accurate and up to date medications and allergy information. She has no other questions or concerns.   Kerry Perez, PharmD PGY1 Acute Care Pharmacy Resident Pager: [phone] 09/17/2017 1:31 PM

## 2017-09-20 ENCOUNTER — Encounter (HOSPITAL_COMMUNITY): Payer: Self-pay

## 2017-09-20 ENCOUNTER — Encounter (HOSPITAL_COMMUNITY)
Admission: RE | Admit: 2017-09-20 | Discharge: 2017-09-20 | Disposition: A | Discharge status: Home / Self Care | Payer: Medicare Other | Source: Ambulatory Visit | Attending: Internal Medicine | Admitting: Internal Medicine

## 2017-09-20 VITALS — Ht 62.5 in | Wt 166.4 lb

## 2017-09-20 DIAGNOSIS — Z79899 Other long term (current) drug therapy: Secondary | ICD-10-CM | POA: Diagnosis not present

## 2017-09-20 DIAGNOSIS — Z7982 Long term (current) use of aspirin: Secondary | ICD-10-CM | POA: Insufficient documentation

## 2017-09-20 DIAGNOSIS — Z7902 Long term (current) use of antithrombotics/antiplatelets: Secondary | ICD-10-CM | POA: Diagnosis not present

## 2017-09-20 DIAGNOSIS — Z955 Presence of coronary angioplasty implant and graft: Secondary | ICD-10-CM | POA: Diagnosis not present

## 2017-09-20 DIAGNOSIS — Z9861 Coronary angioplasty status: Secondary | ICD-10-CM

## 2017-09-20 NOTE — Progress Notes (Signed)
Kerry Perez 67 y.o. female DOB: Dec 02, 1950 MRN: [id number]      Nutrition Note  1. Status post coronary artery stent placement   2. S/P PTCA (percutaneous transluminal coronary angioplasty)    Past Medical History:  Diagnosis Date  . Abnormal cardiac CT angiography    a. Ancillary findings: prominent hilar lymphoid tissue nonspecific - possibly related to CHF and lymphatic congestion, clinical correlation for signs/sx of ymphoproliferative disorder or other malignancy is suggested - denied constitutional sx and will f/u with PCP.  Marland Kitchen Anemia   . Aortic stenosis    a. Mild by echo 10/2013.  Marland Kitchen Arthritis    "knees" (05/03/2017)   . CAD (coronary artery disease)    a. Moderate LAD lesion by cath 10/15/13 with nonischemic nuclear stress test 10/17/13  . Diastolic CHF (HCC)    a. Dx 10/2013 - normal EF but elevated LVEDP by cath.  . GERD (gastroesophageal reflux disease)    mild  . Heart failure (HCC)   . Heart murmur   . History of blood transfusion 1965   "related to OR"  . Hypercholesterolemia   . Hypertension   . Migraine    "they've faded out; might have 1/year; if that; not as strong as when I was a kid" (05/03/2017)  . S/P angioplasty with stent 05/03/17 to mLAD with DES and ostium of D2 with 50% residual stenosis.   05/04/2017  . Type 2 diabetes, diet controlled (HCC)    Meds reviewed.   HT: Ht Readings from Last 1 Encounters:  09/20/17 5' 2.5" (1.588 m)    WT: Wt Readings from Last 3 Encounters:  09/20/17 166 lb 7.2 oz (75.5 kg)  08/20/17 165 lb (74.8 kg)  08/13/17 165 lb 3.2 oz (74.9 kg)     BMI 30.7   Current tobacco use? No   Labs:  Lipid Panel     Component Value Date/Time   CHOL 156 08/13/2015 1031   TRIG 56 08/13/2015 1031   HDL 87 08/13/2015 1031   CHOLHDL 1.8 08/13/2015 1031   VLDL 11 08/13/2015 1031   LDLCALC 58 08/13/2015 1031   No results found for: HGBA1C   Fasting CBG  93      05/04/17 No results for input(s): GLUCAP in the last 72  hours.  Nutrition Note Spoke with pt. Nutrition plan and goals reviewed with pt. Pt is following Step 2 of the Therapeutic Lifestyle Changes diet. Pt wants to lose wt. Wt loss tips reviewed. Pt is diabetic according to EMR. Pt states she is "borderline" diabetic. Last A1c indicates blood glucose well-controlled. Pt checks CBG's "periodically, maybe 2 times a week." Fasting CBG's reportedly ~110 mg/dL. Pt expressed understanding of the information reviewed. Pt aware of nutrition education classes offered and plans on attending nutrition classes.  Nutrition Diagnosis ? Food-and nutrition-related knowledge deficit related to lack of exposure to information as related to diagnosis of: ? CVD ? DM ? Obesity related to excessive energy intake as evidenced by a BMI of 30.7  Nutrition Intervention ? Pt's individual nutrition plan and goals reviewed with pt.  Nutrition Goal(s):  ? Pt to identify food quantities necessary to achieve weight loss of 6-24 lb (2.7-10.9 kg) at graduation from cardiac rehab. Goal wt of 145 lb desired.   Plan:  Pt to attend nutrition classes ? Nutrition I ? Nutrition II ? Portion Distortion ? Diabetes Blitz ? Diabetes Q & A Will provide client-centered nutrition education as part of interdisciplinary care.   Monitor and  evaluate progress toward nutrition goal with team.  Mickle PlumbEdna Kamari Buch, M.Ed, RD, LDN, CDE 09/20/2017 3:31 PM

## 2017-09-20 NOTE — Progress Notes (Signed)
Cardiac Individual Treatment Plan  Patient Details  Name: Kerry Perez MRN: [id number] Date of Birth: 16-Apr-1951 Referring Provider:     CARDIAC REHAB PHASE II [facility]  Referring Provider  End, Cristal Deerhristopher MD      Initial Encounter Date:    CARDIAC REHAB PHASE II [facility]  Date  09/20/17  Referring Provider  End, Cristal Deerhristopher MD      Visit Diagnosis: Status post coronary artery stent placement  S/P PTCA (percutaneous transluminal coronary angioplasty)  Patient's Home Medications on Admission:  Current Outpatient Medications:  .  acetaminophen (TYLENOL) 325 MG tablet, Take 650 mg by mouth every 6 (six) hours as needed for mild pain or headache. , Disp: , Rfl:  .  aspirin 81 MG tablet, Take 81 mg by mouth daily., Disp: , Rfl:  .  atorvastatin (LIPITOR) 80 MG tablet, Take 1 tablet (80 mg total) by mouth daily., Disp: 90 tablet, Rfl: 2 .  CALCIUM PO, Take 1 tablet by mouth daily., Disp: , Rfl:  .  Cholecalciferol (VITAMIN D) 2000 UNITS CAPS, Take 1 capsule by mouth daily. , Disp: , Rfl:  .  clopidogrel (PLAVIX) 75 MG tablet, Take 1 tablet (75 mg total) by mouth daily with breakfast., Disp: 30 tablet, Rfl: 11 .  ezetimibe (ZETIA) 10 MG tablet, Take 1 tablet (10 mg total) by mouth daily., Disp: 90 tablet, Rfl: 2 .  furosemide (LASIX) 40 MG tablet, Take 1 tablet (40 mg total) by mouth daily as needed (shortness of breath, fluid weight gain or fluid retention)., Disp: 30 tablet, Rfl: 11 .  losartan-hydrochlorothiazide (HYZAAR) 100-25 MG tablet, Take 1 tablet by mouth daily., Disp: , Rfl:  .  metoprolol succinate (TOPROL-XL) 25 MG 24 hr tablet, Take 1 tablet (25 mg total) by mouth daily., Disp: 90 tablet, Rfl: 3 .  Multiple Vitamins-Minerals (MULTIVITAMIN PO), Take 1 tablet by mouth daily. , Disp: , Rfl:  .  nitroGLYCERIN (NITROSTAT) 0.4 MG SL tablet, Place  1 tablet (0.4 mg total) under the tongue every 5 (five) minutes as needed for chest pain (up to 3 doses)., Disp: 25 tablet, Rfl: 5 .  ONE TOUCH ULTRA TEST test strip, USE TO TEST BLOOD SUGAR ONCE A DAY, Disp: , Rfl: 3  Past Medical History: Past Medical History:  Diagnosis Date  . Abnormal cardiac CT angiography    a. Ancillary findings: prominent hilar lymphoid tissue nonspecific - possibly related to CHF and lymphatic congestion, clinical correlation for signs/sx of ymphoproliferative disorder or other malignancy is suggested - denied constitutional sx and will f/u with PCP.  Marland Kitchen. Anemia   . Aortic stenosis    a. Mild by echo 10/2013.  Marland Kitchen. Arthritis    "knees" (05/03/2017)   . CAD (coronary artery disease)    a. Moderate LAD lesion by cath 10/15/13 with nonischemic nuclear stress test 10/17/13  . Diastolic CHF (HCC)    a. Dx 10/2013 - normal EF but elevated LVEDP by cath.  . GERD (gastroesophageal reflux disease)    mild  . Heart failure (HCC)   . Heart murmur   . History of blood transfusion 1965   "related to OR"  . Hypercholesterolemia   . Hypertension   . Migraine    "they've faded out; might have 1/year; if that; not as strong as when I was a kid" (05/03/2017)  . S/P angioplasty with stent 05/03/17 to mLAD with DES and ostium  of D2 with 50% residual stenosis.   05/04/2017  . Type 2 diabetes, diet controlled (HCC)     Tobacco Use: Social History   Tobacco Use  Smoking Status Never Smoker  Smokeless Tobacco Never Used    Labs: Recent Review Flowsheet Data    Labs for ITP Cardiac and Pulmonary Rehab Latest Ref Rng & Units 10/17/2013 08/10/2014 08/13/2015   Cholestrol 125 - 200 mg/dL 161 096 045   LDLCALC <409 mg/dL 58 69 58   HDL >=81 mg/dL 60 19.14 87   Trlycerides <150 mg/dL 98 78.2 56      Capillary Blood Glucose: Lab Results  Component Value Date   GLUCAP 77 05/04/2017   GLUCAP 95 05/03/2017   GLUCAP 134 (H) 05/03/2017   GLUCAP 96 05/03/2017     Exercise Target  Goals: Date: 09/20/17  Exercise Program Goal: Individual exercise prescription set using results from initial 6 min walk test and THRR while considering  patient's activity barriers and safety.   Exercise Prescription Goal: Initial exercise prescription builds to 30-45 minutes a day of aerobic activity, 2-3 days per week.  Home exercise guidelines will be given to patient during program as part of exercise prescription that the participant will acknowledge.  Activity Barriers & Risk Stratification: Activity Barriers & Cardiac Risk Stratification - 09/20/17 0850      Activity Barriers & Cardiac Risk Stratification   Activity Barriers  Other (comment)    Comments  Bilateral Knee Pain    Cardiac Risk Stratification  High       6 Minute Walk: 6 Minute Walk    Row Name 09/20/17 1017         6 Minute Walk   Phase  Initial     Distance  1768 feet     Walk Time  6 minutes     # of Rest Breaks  0     MPH  3.35     METS  3.81     RPE  12     Perceived Dyspnea   0     VO2 Peak  13.33     Symptoms  No     Resting HR  60 bpm     Resting BP  124/70     Resting Oxygen Saturation   100 %     Exercise Oxygen Saturation  during 6 min walk  99 %     Max Ex. HR  107 bpm     Max Ex. BP  140/80     2 Minute Post BP  120/60        Oxygen Initial Assessment:   Oxygen Re-Evaluation:   Oxygen Discharge (Final Oxygen Re-Evaluation):   Initial Exercise Prescription: Initial Exercise Prescription - 09/20/17 1000      Date of Initial Exercise RX and Referring Provider   Date  09/20/17    Referring Provider  End, Cristal Deer MD      Recumbant Bike   Level  2    RPM  70    Watts  30    Minutes  10    METs  3.26      NuStep   Level  3    SPM  70    Minutes  10    METs  3      Track   Laps  13    Minutes  10    METs  3.3      Prescription Details   Frequency (times per week)  3x    Duration  Progress to 45 minutes of aerobic exercise without signs/symptoms of physical  distress      Intensity   THRR 40-80% of Max Heartrate  61-122    Ratings of Perceived Exertion  11-15    Perceived Dyspnea  0-4      Progression   Progression  Continue progressive overload as per policy without signs/symptoms or physical distress.      Resistance Training   Training Prescription  Yes    Weight  3lbs    Reps  10-15       Perform Capillary Blood Glucose checks as needed.  Exercise Prescription Changes:   Exercise Comments:   Exercise Goals and Review: Exercise Goals    Row Name 09/20/17 1013             Exercise Goals   Increase Physical Activity  Yes       Intervention  Provide advice, education, support and counseling about physical activity/exercise needs.;Develop an individualized exercise prescription for aerobic and resistive training based on initial evaluation findings, risk stratification, comorbidities and participant's personal goals.       Expected Outcomes  Short Term: Attend rehab on a regular basis to increase amount of physical activity.;Long Term: Add in home exercise to make exercise part of routine and to increase amount of physical activity.;Long Term: Exercising regularly at least 3-5 days a week.       Increase Strength and Stamina  Yes       Intervention  Provide advice, education, support and counseling about physical activity/exercise needs.;Develop an individualized exercise prescription for aerobic and resistive training based on initial evaluation findings, risk stratification, comorbidities and participant's personal goals.       Expected Outcomes  Short Term: Increase workloads from initial exercise prescription for resistance, speed, and METs.;Short Term: Perform resistance training exercises routinely during rehab and add in resistance training at home;Long Term: Improve cardiorespiratory fitness, muscular endurance and strength as measured by increased METs and functional capacity ( )       Able to understand and use rate of  perceived exertion (RPE) scale  Yes       Intervention  Provide education and explanation on how to use RPE scale       Expected Outcomes  Short Term: Able to use RPE daily in rehab to express subjective intensity level;Long Term:  Able to use RPE to guide intensity level when exercising independently       Intervention  Provide education and explanation on how to use Dyspnea scale       Expected Outcomes  Short Term: Able to use Dyspnea scale daily in rehab to express subjective sense of shortness of breath during exertion;Long Term: Able to use Dyspnea scale to guide intensity level when exercising independently       Knowledge and understanding of Target Heart Rate Range (THRR)  Yes       Intervention  Provide education and explanation of THRR including how the numbers were predicted and where they are located for reference       Expected Outcomes  Short Term: Able to state/look up THRR;Long Term: Able to use THRR to govern intensity when exercising independently;Short Term: Able to use daily as guideline for intensity in rehab       Able to check pulse independently  Yes       Intervention  Provide education and demonstration on how to check pulse in carotid and radial arteries.;Review the importance  of being able to check your own pulse for safety during independent exercise       Expected Outcomes  Short Term: Able to explain why pulse checking is important during independent exercise;Long Term: Able to check pulse independently and accurately       Understanding of Exercise Prescription  Yes       Intervention  Provide education, explanation, and written materials on patient's individual exercise prescription       Expected Outcomes  Short Term: Able to explain program exercise prescription;Long Term: Able to explain home exercise prescription to exercise independently          Exercise Goals Re-Evaluation :    Discharge Exercise Prescription (Final Exercise Prescription  Changes):   Nutrition:  Target Goals: Understanding of nutrition guidelines, daily intake of sodium 1500mg , cholesterol 200mg , calories 30% from fat and 7% or less from saturated fats, daily to have 5 or more servings of fruits and vegetables.  Biometrics: Pre Biometrics - 09/20/17 1042      Pre Biometrics   Height  5' 2.5" (1.588 m)    Weight  166 lb 7.2 oz (75.5 kg)    Waist Circumference  36.5 inches    Hip Circumference  43 inches    Waist to Hip Ratio  0.85 %    BMI (Calculated)  29.94    Triceps Skinfold  45 mm    % Body Fat  43.5 %    Grip Strength  32 kg    Flexibility  14 in    Single Leg Stand  30 seconds        Nutrition Therapy Plan and Nutrition Goals:   Nutrition Assessments:   Nutrition Goals Re-Evaluation:   Nutrition Goals Re-Evaluation:   Nutrition Goals Discharge (Final Nutrition Goals Re-Evaluation):   Psychosocial: Target Goals: Acknowledge presence or absence of significant depression and/or stress, maximize coping skills, provide positive support system. Participant is able to verbalize types and ability to use techniques and skills needed for reducing stress and depression.  Initial Review & Psychosocial Screening: Initial Psych Review & Screening - 09/20/17 1213      Initial Review   Current issues with  None Identified      Family Dynamics   Good Support System?  Yes      Barriers   Psychosocial barriers to participate in program  The patient should benefit from training in stress management and relaxation.      Screening Interventions   Interventions  Encouraged to exercise    Expected Outcomes  Long Term goal: The participant improves quality of Life and PHQ9 Scores as seen by post scores and/or verbalization of changes;Short Term goal: Identification and review with participant of any Quality of Life or Depression concerns found by scoring the questionnaire.       Quality of Life Scores: Quality of Life - 09/20/17 0847       Quality of Life Scores   Health/Function Pre  21.4 %    Socioeconomic Pre  25.71 %    Psych/Spiritual Pre  22.67 %    Family Pre  28.8 %    GLOBAL Pre  23.67 %      Scores of 19 and below usually indicate a poorer quality of life in these areas.  A difference of  2-3 points is a clinically meaningful difference.  A difference of 2-3 points in the total score of the Quality of Life Index has been associated with significant improvement in overall quality of  life, self-image, physical symptoms, and general health in studies assessing change in quality of life.  PHQ-9: Recent Review Flowsheet Data    There is no flowsheet data to display.     Interpretation of Total Score  Total Score Depression Severity:  1-4 = Minimal depression, 5-9 = Mild depression, 10-14 = Moderate depression, 15-19 = Moderately severe depression, 20-27 = Severe depression   Psychosocial Evaluation and Intervention:   Psychosocial Re-Evaluation:   Psychosocial Discharge (Final Psychosocial Re-Evaluation):   Vocational Rehabilitation: Provide vocational rehab assistance to qualifying candidates.   Vocational Rehab Evaluation & Intervention: Vocational Rehab - 09/20/17 1214      Initial Vocational Rehab Evaluation & Intervention   Assessment shows need for Vocational Rehabilitation  No       Education: Education Goals: Education classes will be provided on a weekly basis, covering required topics. Participant will state understanding/return demonstration of topics presented.  Learning Barriers/Preferences: Learning Barriers/Preferences - 09/20/17 0849      Learning Barriers/Preferences   Learning Barriers  Sight    Learning Preferences  Written Material;Individual Instruction;Skilled Demonstration       Education Topics: Count Your Pulse:  -Group instruction provided by verbal instruction, demonstration, patient participation and written materials to support subject.  Instructors address  importance of being able to find your pulse and how to count your pulse when at home without a heart monitor.  Patients get hands on experience counting their pulse with staff help and individually.   Heart Attack, Angina, and Risk Factor Modification:  -Group instruction provided by verbal instruction, video, and written materials to support subject.  Instructors address signs and symptoms of angina and heart attacks.    Also discuss risk factors for heart disease and how to make changes to improve heart health risk factors.   Functional Fitness:  -Group instruction provided by verbal instruction, demonstration, patient participation, and written materials to support subject.  Instructors address safety measures for doing things around the house.  Discuss how to get up and down off the floor, how to pick things up properly, how to safely get out of a chair without assistance, and balance training.   Meditation and Mindfulness:  -Group instruction provided by verbal instruction, patient participation, and written materials to support subject.  Instructor addresses importance of mindfulness and meditation practice to help reduce stress and improve awareness.  Instructor also leads participants through a meditation exercise.    Stretching for Flexibility and Mobility:  -Group instruction provided by verbal instruction, patient participation, and written materials to support subject.  Instructors lead participants through series of stretches that are designed to increase flexibility thus improving mobility.  These stretches are additional exercise for major muscle groups that are typically performed during regular warm up and cool down.   Hands Only CPR:  -Group verbal, video, and participation provides a basic overview of AHA guidelines for community CPR. Role-play of emergencies allow participants the opportunity to practice calling for help and chest compression technique with discussion of AED  use.   Hypertension: -Group verbal and written instruction that provides a basic overview of hypertension including the most recent diagnostic guidelines, risk factor reduction with self-care instructions and medication management.    Nutrition I class: Heart Healthy Eating:  -Group instruction provided by PowerPoint slides, verbal discussion, and written materials to support subject matter. The instructor gives an explanation and review of the Therapeutic Lifestyle Changes diet recommendations, which includes a discussion on lipid goals, dietary fat, sodium, fiber, plant stanol/sterol  esters, sugar, and the components of a well-balanced, healthy diet.   Nutrition II class: Lifestyle Skills:  -Group instruction provided by PowerPoint slides, verbal discussion, and written materials to support subject matter. The instructor gives an explanation and review of label reading, grocery shopping for heart health, heart healthy recipe modifications, and ways to make healthier choices when eating out.   Diabetes Question & Answer:  -Group instruction provided by PowerPoint slides, verbal discussion, and written materials to support subject matter. The instructor gives an explanation and review of diabetes co-morbidities, pre- and post-prandial blood glucose goals, pre-exercise blood glucose goals, signs, symptoms, and treatment of hypoglycemia and hyperglycemia, and foot care basics.   Diabetes Blitz:  -Group instruction provided by PowerPoint slides, verbal discussion, and written materials to support subject matter. The instructor gives an explanation and review of the physiology behind type 1 and type 2 diabetes, diabetes medications and rational behind using different medications, pre- and post-prandial blood glucose recommendations and Hemoglobin A1c goals, diabetes diet, and exercise including blood glucose guidelines for exercising safely.    Portion Distortion:  -Group instruction provided by  PowerPoint slides, verbal discussion, written materials, and food models to support subject matter. The instructor gives an explanation of serving size versus portion size, changes in portions sizes over the last 20 years, and what consists of a serving from each food group.   Stress Management:  -Group instruction provided by verbal instruction, video, and written materials to support subject matter.  Instructors review role of stress in heart disease and how to cope with stress positively.     Exercising on Your Own:  -Group instruction provided by verbal instruction, power point, and written materials to support subject.  Instructors discuss benefits of exercise, components of exercise, frequency and intensity of exercise, and end points for exercise.  Also discuss use of nitroglycerin and activating EMS.  Review options of places to exercise outside of rehab.  Review guidelines for sex with heart disease.   Cardiac Drugs I:  -Group instruction provided by verbal instruction and written materials to support subject.  Instructor reviews cardiac drug classes: antiplatelets, anticoagulants, beta blockers, and statins.  Instructor discusses reasons, side effects, and lifestyle considerations for each drug class.   Cardiac Drugs II:  -Group instruction provided by verbal instruction and written materials to support subject.  Instructor reviews cardiac drug classes: angiotensin converting enzyme inhibitors (ACE-I), angiotensin II receptor blockers (ARBs), nitrates, and calcium channel blockers.  Instructor discusses reasons, side effects, and lifestyle considerations for each drug class.   Anatomy and Physiology of the Circulatory System:  Group verbal and written instruction and models provide basic cardiac anatomy and physiology, with the coronary electrical and arterial systems. Review of: AMI, Angina, Valve disease, Heart Failure, Peripheral Artery Disease, Cardiac Arrhythmia, Pacemakers, and  the ICD.   Other Education:  -Group or individual verbal, written, or video instructions that support the educational goals of the cardiac rehab program.   Holiday Eating Survival Tips:  -Group instruction provided by PowerPoint slides, verbal discussion, and written materials to support subject matter. The instructor gives patients tips, tricks, and techniques to help them not only survive but enjoy the holidays despite the onslaught of food that accompanies the holidays.   Knowledge Questionnaire Score: Knowledge Questionnaire Score - 09/20/17 0847      Knowledge Questionnaire Score   Pre Score  20/24       Core Components/Risk Factors/Patient Goals at Admission: Personal Goals and Risk Factors at Admission - 09/20/17  1020      Core Components/Risk Factors/Patient Goals on Admission    Weight Management  Yes;Weight Loss    Intervention  Weight Management: Provide education and appropriate resources to help participant work on and attain dietary goals.;Weight Management: Develop a combined nutrition and exercise program designed to reach desired caloric intake, while maintaining appropriate intake of nutrient and fiber, sodium and fats, and appropriate energy expenditure required for the weight goal.;Weight Management/Obesity: Establish reasonable short term and long term weight goals.    Admit Weight  166 lb 7.2 oz (75.5 kg)    Goal Weight: Short Term  156 lb (70.8 kg)    Goal Weight: Long Term  146 lb (66.2 kg)    Expected Outcomes  Short Term: Continue to assess and modify interventions until short term weight is achieved;Long Term: Adherence to nutrition and physical activity/exercise program aimed toward attainment of established weight goal;Understanding recommendations for meals to include 15-35% energy as protein, 25-35% energy from fat, 35-60% energy from carbohydrates, less than 200mg  of dietary cholesterol, 20-35 gm of total fiber daily;Weight Loss: Understanding of general  recommendations for a balanced deficit meal plan, which promotes 1-2 lb weight loss per week and includes a negative energy balance of [phone] kcal/d;Understanding of distribution of calorie intake throughout the day with the consumption of 4-5 meals/snacks    Diabetes  Yes    Intervention  Provide education about signs/symptoms and action to take for hypo/hyperglycemia.;Provide education about proper nutrition, including hydration, and aerobic/resistive exercise prescription along with prescribed medications to achieve blood glucose in normal ranges: Fasting glucose 65-99 mg/dL    Expected Outcomes  Short Term: Participant verbalizes understanding of the signs/symptoms and immediate care of hyper/hypoglycemia, proper foot care and importance of medication, aerobic/resistive exercise and nutrition plan for blood glucose control.;Long Term: Attainment of HbA1C < 7%.    Hypertension  Yes    Intervention  Provide education on lifestyle modifcations including regular physical activity/exercise, weight management, moderate sodium restriction and increased consumption of fresh fruit, vegetables, and low fat dairy, alcohol moderation, and smoking cessation.;Monitor prescription use compliance.    Expected Outcomes  Short Term: Continued assessment and intervention until BP is < 140/58mm HG in hypertensive participants. < 130/52mm HG in hypertensive participants with diabetes, heart failure or chronic kidney disease.;Long Term: Maintenance of blood pressure at goal levels.    Lipids  Yes    Intervention  Provide education and support for participant on nutrition & aerobic/resistive exercise along with prescribed medications to achieve LDL 70mg , HDL >40mg .    Expected Outcomes  Short Term: Participant states understanding of desired cholesterol values and is compliant with medications prescribed. Participant is following exercise prescription and nutrition guidelines.;Long Term: Cholesterol controlled with  medications as prescribed, with individualized exercise RX and with personalized nutrition plan. Value goals: LDL < 70mg , HDL > 40 mg.       Core Components/Risk Factors/Patient Goals Review:    Core Components/Risk Factors/Patient Goals at Discharge (Final Review):    ITP Comments: ITP Comments    Row Name 09/20/17 0850           ITP Comments  Dr. Armanda Magic, Medical Director          Comments:  Patient attended orientation from 0825 to 0940 to review rules and guidelines for program. Completed 6 minute walk test, Intitial ITP, and exercise prescription.  VSS. Telemetry-SR rare PAC.  Pt asymptomatic and tolerated well. Brief Psychosocial  Assessment reveal no immediate barriers to participating in  Cardiac Rehab.  Pt is excited and eager to engage in full exercise. Alanson Aly, BSN Cardiac and Emergency planning/management officer

## 2017-09-24 ENCOUNTER — Encounter (HOSPITAL_COMMUNITY)
Admission: RE | Admit: 2017-09-24 | Discharge: 2017-09-24 | Disposition: A | Discharge status: Home / Self Care | Payer: Medicare Other | Source: Ambulatory Visit | Attending: Internal Medicine | Admitting: Internal Medicine

## 2017-09-24 ENCOUNTER — Encounter (HOSPITAL_COMMUNITY): Payer: Medicare Other

## 2017-09-24 DIAGNOSIS — Z7902 Long term (current) use of antithrombotics/antiplatelets: Secondary | ICD-10-CM | POA: Diagnosis not present

## 2017-09-24 DIAGNOSIS — Z955 Presence of coronary angioplasty implant and graft: Secondary | ICD-10-CM | POA: Diagnosis not present

## 2017-09-24 DIAGNOSIS — Z9861 Coronary angioplasty status: Secondary | ICD-10-CM

## 2017-09-24 DIAGNOSIS — Z79899 Other long term (current) drug therapy: Secondary | ICD-10-CM | POA: Diagnosis not present

## 2017-09-24 DIAGNOSIS — Z7982 Long term (current) use of aspirin: Secondary | ICD-10-CM | POA: Diagnosis not present

## 2017-09-24 LAB — GLUCOSE, CAPILLARY
GLUCOSE-CAPILLARY: 110 mg/dL — AB (ref 65–99)
Glucose-Capillary: 123 mg/dL — ABNORMAL HIGH (ref 65–99)

## 2017-09-24 NOTE — Progress Notes (Signed)
Daily Session Note  Patient Details  Name: Kerry Perez MRN: [id number] Date of Birth: Jul 22, 1951 Referring Provider:     [place] from 09/20/2017 in [place]  Referring Provider  End, Harrell Gave MD      Encounter Date: 09/24/2017  Check In: Session Check In - 09/24/17 1639      Check-In   Location  [facility]    Staff Present  Dorna Bloom, MS, ACSM RCEP, Exercise Physiologist;Tyara Nevels, MS,ACSM CEP, Exercise Physiologist;Olinty Celesta Aver, MS, ACSM CEP, Exercise Physiologist;Maria Whitaker, RN, BSN;Other    Supervising physician immediately available to respond to emergencies  [facility] immediately available    Physician(s)  Dr. Tyrell Antonio    Medication changes reported      No    Fall or balance concerns reported     No    Tobacco Cessation  No Change    Warm-up and Cool-down  Performed as group-led instruction    Resistance Training Performed  Yes    VAD Patient?  No      Pain Assessment   Currently in Pain?  No/denies    Multiple Pain Sites  No       Capillary Blood Glucose: Results for orders placed or performed during the hospital encounter of 09/24/17 (from the past 24 hour(s))  Glucose, capillary     Status: Abnormal   Collection Time: 09/24/17  9:51 AM  Result Value Ref Range   Glucose-Capillary 110 (H) 65 - 99 mg/dL  Glucose, capillary     Status: Abnormal   Collection Time: 09/24/17 10:48 AM  Result Value Ref Range   Glucose-Capillary 123 (H) 65 - 99 mg/dL    Exercise Prescription Changes - 09/24/17 0955      Response to Exercise   Blood Pressure (Admit)  124/72    Blood Pressure (Exercise)  150/70    Blood Pressure (Exit)  134/70    Heart Rate (Admit)  65 bpm    Heart Rate (Exercise)  101 bpm    Heart Rate (Exit)  61 bpm    Rating of Perceived Exertion (Exercise)  14    Symptoms  none    Duration  Continue with 30 min of aerobic exercise without signs/symptoms  of physical distress.    Intensity  THRR unchanged      Progression   Progression  Continue to progress workloads to maintain intensity without signs/symptoms of physical distress.    Average METs  3.1      Resistance Training   Training Prescription  Yes    Weight  3lbs    Reps  10-15    Time  10 Minutes      Interval Training   Interval Training  No      Recumbant Bike   Level  2    RPM  70    Watts  30    Minutes  10    METs  2.2      NuStep   Level  3    SPM  70    Minutes  10    METs  3.4      Track   Laps  15    Minutes  10    METs  3.6       Social History   Tobacco Use  Smoking Status Never Smoker  Smokeless Tobacco Never Used    Goals Met:  Exercise tolerated well  Goals Unmet:  Not Applicable  Comments: Cat started cardiac rehab today.  Pt tolerated light exercise without difficulty. VSS, telemetry-Sinus Rhythm, asymptomatic.  Medication list reconciled. Pt denies barriers to medicaiton compliance.  PSYCHOSOCIAL ASSESSMENT:  PHQ-0. Pt exhibits positive coping skills, hopeful outlook with supportive family. No psychosocial needs identified at this time, no psychosocial interventions necessary.    Pt enjoys reading walking clogging and listening to music.   Pt oriented to exercise equipment and routine.    Understanding verbalized.   Dr. Fransico Him is Medical Director for Cardiac Rehab at [facility].

## 2017-09-25 NOTE — Progress Notes (Signed)
Cardiac Individual Treatment Plan  Patient Details  Name: Kerry Perez MRN: [id number] Date of Birth: 11-29-1950 Referring Provider:     CARDIAC REHAB PHASE II ORIENTATION from 09/20/2017 in [facility] CARDIAC [facility]  Referring Provider  End, Cristal Deer MD      Initial Encounter Date:    CARDIAC REHAB PHASE II ORIENTATION from 09/20/2017 in [facility] CARDIAC REHAB  Date  09/20/17  Referring Provider  End, Cristal Deer MD      Visit Diagnosis: Status post coronary artery stent placement  S/P PTCA (percutaneous transluminal coronary angioplasty)  Patient's Home Medications on Admission:  Current Outpatient Medications:  .  acetaminophen (TYLENOL) 325 MG tablet, Take 650 mg by mouth every 6 (six) hours as needed for mild pain or headache. , Disp: , Rfl:  .  aspirin 81 MG tablet, Take 81 mg by mouth daily., Disp: , Rfl:  .  CALCIUM PO, Take 1 tablet by mouth daily., Disp: , Rfl:  .  Cholecalciferol (VITAMIN D) 2000 UNITS CAPS, Take 1 capsule by mouth daily. , Disp: , Rfl:  .  clopidogrel (PLAVIX) 75 MG tablet, Take 1 tablet (75 mg total) by mouth daily with breakfast., Disp: 30 tablet, Rfl: 11 .  furosemide (LASIX) 40 MG tablet, Take 1 tablet (40 mg total) by mouth daily as needed (shortness of breath, fluid weight gain or fluid retention)., Disp: 30 tablet, Rfl: 11 .  losartan-hydrochlorothiazide (HYZAAR) 100-25 MG tablet, Take 1 tablet by mouth daily., Disp: , Rfl:  .  metoprolol succinate (TOPROL-XL) 25 MG 24 hr tablet, Take 1 tablet (25 mg total) by mouth daily., Disp: 90 tablet, Rfl: 3 .  Multiple Vitamins-Minerals (MULTIVITAMIN PO), Take 1 tablet by mouth daily. , Disp: , Rfl:  .  nitroGLYCERIN (NITROSTAT) 0.4 MG SL tablet, Place 1 tablet (0.4 mg total) under the tongue every 5 (five) minutes as needed for chest pain (up to 3 doses)., Disp: 25 tablet, Rfl: 5 .  ONE TOUCH ULTRA TEST test strip, USE TO TEST BLOOD SUGAR ONCE A DAY, Disp: ,  Rfl: 3 .  atorvastatin (LIPITOR) 80 MG tablet, Take 1 tablet (80 mg total) by mouth daily., Disp: 90 tablet, Rfl: 2 .  ezetimibe (ZETIA) 10 MG tablet, Take 1 tablet (10 mg total) by mouth daily., Disp: 90 tablet, Rfl: 2  Past Medical History: Past Medical History:  Diagnosis Date  . Abnormal cardiac CT angiography    a. Ancillary findings: prominent hilar lymphoid tissue nonspecific - possibly related to CHF and lymphatic congestion, clinical correlation for signs/sx of ymphoproliferative disorder or other malignancy is suggested - denied constitutional sx and will f/u with PCP.  Marland Kitchen Anemia   . Aortic stenosis    a. Mild by echo 10/2013.  Marland Kitchen Arthritis    "knees" (05/03/2017)   . CAD (coronary artery disease)    a. Moderate LAD lesion by cath 10/15/13 with nonischemic nuclear stress test 10/17/13  . Diastolic CHF (HCC)    a. Dx 10/2013 - normal EF but elevated LVEDP by cath.  . GERD (gastroesophageal reflux disease)    mild  . Heart failure (HCC)   . Heart murmur   . History of blood transfusion 1965   "related to OR"  . Hypercholesterolemia   . Hypertension   . Migraine    "they've faded out; might have 1/year; if that; not as strong as when I was a kid" (05/03/2017)  . S/P angioplasty with stent 05/03/17 to mLAD with DES and ostium  of D2 with 50% residual stenosis.   05/04/2017  . Type 2 diabetes, diet controlled (HCC)     Tobacco Use: Social History   Tobacco Use  Smoking Status Never Smoker  Smokeless Tobacco Never Used    Labs: Recent Review Flowsheet Data    Labs for ITP Cardiac and Pulmonary Rehab Latest Ref Rng & Units 10/17/2013 08/10/2014 08/13/2015   Cholestrol 125 - 200 mg/dL [id number]   LDLCALC <782<130 mg/dL 58 69 58   HDL >=95>=46 mg/dL 60 [phone] 87   Trlycerides <150 mg/dL 98 [phone] 56      Capillary Blood Glucose: Lab Results  Component Value Date   GLUCAP 123 (H) 09/24/2017   GLUCAP 110 (H) 09/24/2017   GLUCAP 77 05/04/2017   GLUCAP 95 05/03/2017   GLUCAP 134 (H)  05/03/2017     Exercise Target Goals:    Exercise Program Goal: Individual exercise prescription set using results from initial 6 min walk test and THRR while considering  patient's activity barriers and safety.   Exercise Prescription Goal: Initial exercise prescription builds to 30-45 minutes a day of aerobic activity, 2-3 days per week.  Home exercise guidelines will be given to patient during program as part of exercise prescription that the participant will acknowledge.  Activity Barriers & Risk Stratification: Activity Barriers & Cardiac Risk Stratification - 09/20/17 0850      Activity Barriers & Cardiac Risk Stratification   Activity Barriers  Other (comment)    Comments  Bilateral Knee Pain    Cardiac Risk Stratification  High       6 Minute Walk: 6 Minute Walk    Row Name 09/20/17 1017         6 Minute Walk   Phase  Initial     Distance  1768 feet     Walk Time  6 minutes     # of Rest Breaks  0     MPH  3.35     METS  3.81     RPE  12     Perceived Dyspnea   0     VO2 Peak  13.33     Symptoms  No     Resting HR  60 bpm     Resting BP  124/70     Resting Oxygen Saturation   100 %     Exercise Oxygen Saturation  during 6 min walk  99 %     Max Ex. HR  107 bpm     Max Ex. BP  140/80     2 Minute Post BP  120/60        Oxygen Initial Assessment:   Oxygen Re-Evaluation:   Oxygen Discharge (Final Oxygen Re-Evaluation):   Initial Exercise Prescription: Initial Exercise Prescription - 09/20/17 1000      Date of Initial Exercise RX and Referring Provider   Date  09/20/17    Referring Provider  End, Cristal Deerhristopher MD      Recumbant Bike   Level  2    RPM  70    Watts  30    Minutes  10    METs  3.26      NuStep   Level  3    SPM  70    Minutes  10    METs  3      Track   Laps  13    Minutes  10    METs  3.3      Prescription  Details   Frequency (times per week)  3x    Duration  Progress to 45 minutes of aerobic exercise without  signs/symptoms of physical distress      Intensity   THRR 40-80% of Max Heartrate  61-122    Ratings of Perceived Exertion  11-15    Perceived Dyspnea  0-4      Progression   Progression  Continue progressive overload as per policy without signs/symptoms or physical distress.      Resistance Training   Training Prescription  Yes    Weight  3lbs    Reps  10-15       Perform Capillary Blood Glucose checks as needed.  Exercise Prescription Changes:  Exercise Prescription Changes    Row Name 09/24/17 0955             Response to Exercise   Blood Pressure (Admit)  124/72       Blood Pressure (Exercise)  150/70       Blood Pressure (Exit)  134/70       Heart Rate (Admit)  65 bpm       Heart Rate (Exercise)  101 bpm       Heart Rate (Exit)  61 bpm       Rating of Perceived Exertion (Exercise)  14       Symptoms  none       Duration  Continue with 30 min of aerobic exercise without signs/symptoms of physical distress.       Intensity  THRR unchanged         Progression   Progression  Continue to progress workloads to maintain intensity without signs/symptoms of physical distress.       Average METs  3.1         Resistance Training   Training Prescription  Yes       Weight  3lbs       Reps  10-15       Time  10 Minutes         Interval Training   Interval Training  No         Recumbant Bike   Level  2       RPM  70       Watts  30       Minutes  10       METs  2.2         NuStep   Level  3       SPM  70       Minutes  10       METs  3.4         Track   Laps  15       Minutes  10       METs  3.6          Exercise Comments:  Exercise Comments    Row Name 09/24/17 1418           Exercise Comments  Off to a good start with exercise.          Exercise Goals and Review:  Exercise Goals    Row Name 09/20/17 1013             Exercise Goals   Increase Physical Activity  Yes       Intervention  Provide advice, education, support and  counseling about physical activity/exercise needs.;Develop an individualized exercise prescription for aerobic and resistive training based on initial evaluation  findings, risk stratification, comorbidities and participant's personal goals.       Expected Outcomes  Short Term: Attend rehab on a regular basis to increase amount of physical activity.;Long Term: Add in home exercise to make exercise part of routine and to increase amount of physical activity.;Long Term: Exercising regularly at least 3-5 days a week.       Increase Strength and Stamina  Yes       Intervention  Provide advice, education, support and counseling about physical activity/exercise needs.;Develop an individualized exercise prescription for aerobic and resistive training based on initial evaluation findings, risk stratification, comorbidities and participant's personal goals.       Expected Outcomes  Short Term: Increase workloads from initial exercise prescription for resistance, speed, and METs.;Short Term: Perform resistance training exercises routinely during rehab and add in resistance training at home;Long Term: Improve cardiorespiratory fitness, muscular endurance and strength as measured by increased METs and functional capacity ( )       Able to understand and use rate of perceived exertion (RPE) scale  Yes       Intervention  Provide education and explanation on how to use RPE scale       Expected Outcomes  Short Term: Able to use RPE daily in rehab to express subjective intensity level;Long Term:  Able to use RPE to guide intensity level when exercising independently       Intervention  Provide education and explanation on how to use Dyspnea scale       Expected Outcomes  Short Term: Able to use Dyspnea scale daily in rehab to express subjective sense of shortness of breath during exertion;Long Term: Able to use Dyspnea scale to guide intensity level when exercising independently       Knowledge and understanding of  Target Heart Rate Range (THRR)  Yes       Intervention  Provide education and explanation of THRR including how the numbers were predicted and where they are located for reference       Expected Outcomes  Short Term: Able to state/look up THRR;Long Term: Able to use THRR to govern intensity when exercising independently;Short Term: Able to use daily as guideline for intensity in rehab       Able to check pulse independently  Yes       Intervention  Provide education and demonstration on how to check pulse in carotid and radial arteries.;Review the importance of being able to check your own pulse for safety during independent exercise       Expected Outcomes  Short Term: Able to explain why pulse checking is important during independent exercise;Long Term: Able to check pulse independently and accurately       Understanding of Exercise Prescription  Yes       Intervention  Provide education, explanation, and written materials on patient's individual exercise prescription       Expected Outcomes  Short Term: Able to explain program exercise prescription;Long Term: Able to explain home exercise prescription to exercise independently          Exercise Goals Re-Evaluation : Exercise Goals Re-Evaluation    Row Name 09/24/17 1414             Exercise Goal Re-Evaluation   Exercise Goals Review  Able to understand and use rate of perceived exertion (RPE) scale       Comments  Patient tolerated first full day of exercise without c/o. Pt able to use RPE scale appropriately.  Expected Outcomes  Increase workloads as tolerated to achieve health and fitness goals.           Discharge Exercise Prescription (Final Exercise Prescription Changes): Exercise Prescription Changes - 09/24/17 0955      Response to Exercise   Blood Pressure (Admit)  124/72    Blood Pressure (Exercise)  150/70    Blood Pressure (Exit)  134/70    Heart Rate (Admit)  65 bpm    Heart Rate (Exercise)  101 bpm    Heart  Rate (Exit)  61 bpm    Rating of Perceived Exertion (Exercise)  14    Symptoms  none    Duration  Continue with 30 min of aerobic exercise without signs/symptoms of physical distress.    Intensity  THRR unchanged      Progression   Progression  Continue to progress workloads to maintain intensity without signs/symptoms of physical distress.    Average METs  3.1      Resistance Training   Training Prescription  Yes    Weight  3lbs    Reps  10-15    Time  10 Minutes      Interval Training   Interval Training  No      Recumbant Bike   Level  2    RPM  70    Watts  30    Minutes  10    METs  2.2      NuStep   Level  3    SPM  70    Minutes  10    METs  3.4      Track   Laps  15    Minutes  10    METs  3.6       Nutrition:  Target Goals: Understanding of nutrition guidelines, daily intake of sodium 1500mg , cholesterol 200mg , calories 30% from fat and 7% or less from saturated fats, daily to have 5 or more servings of fruits and vegetables.  Biometrics: Pre Biometrics - 09/20/17 1042      Pre Biometrics   Height  5' 2.5" (1.588 m)    Weight  166 lb 7.2 oz (75.5 kg)    Waist Circumference  36.5 inches    Hip Circumference  43 inches    Waist to Hip Ratio  0.85 %    BMI (Calculated)  29.94    Triceps Skinfold  45 mm    % Body Fat  43.5 %    Grip Strength  32 kg    Flexibility  14 in    Single Leg Stand  30 seconds        Nutrition Therapy Plan and Nutrition Goals: Nutrition Therapy & Goals - 09/21/17 0921      Nutrition Therapy   Diet  Carb Modified, Heart Healthy      Personal Nutrition Goals   Nutrition Goal  Pt to identify food quantities necessary to achieve weight loss of 6-24 lb (2.7-10.9 kg) at graduation from cardiac rehab. Goal wt of 145 lb desired.       Intervention Plan   Intervention  Prescribe, educate and counsel regarding individualized specific dietary modifications aiming towards targeted core components such as weight, hypertension,  lipid management, diabetes, heart failure and other comorbidities.    Expected Outcomes  Short Term Goal: Understand basic principles of dietary content, such as calories, fat, sodium, cholesterol and nutrients.;Long Term Goal: Adherence to prescribed nutrition plan.       Nutrition Assessments: Nutrition Assessments -  09/21/17 0921      MEDFICTS Scores   Pre Score  21       Nutrition Goals Re-Evaluation:   Nutrition Goals Re-Evaluation:   Nutrition Goals Discharge (Final Nutrition Goals Re-Evaluation):   Psychosocial: Target Goals: Acknowledge presence or absence of significant depression and/or stress, maximize coping skills, provide positive support system. Participant is able to verbalize types and ability to use techniques and skills needed for reducing stress and depression.  Initial Review & Psychosocial Screening: Initial Psych Review & Screening - 09/20/17 1213      Initial Review   Current issues with  None Identified      Family Dynamics   Good Support System?  Yes      Barriers   Psychosocial barriers to participate in program  The patient should benefit from training in stress management and relaxation.      Screening Interventions   Interventions  Encouraged to exercise    Expected Outcomes  Long Term goal: The participant improves quality of Life and PHQ9 Scores as seen by post scores and/or verbalization of changes;Short Term goal: Identification and review with participant of any Quality of Life or Depression concerns found by scoring the questionnaire.       Quality of Life Scores: Quality of Life - 09/20/17 0847      Quality of Life Scores   Health/Function Pre  21.4 %    Socioeconomic Pre  25.71 %    Psych/Spiritual Pre  22.67 %    Family Pre  28.8 %    GLOBAL Pre  23.67 %      Scores of 19 and below usually indicate a poorer quality of life in these areas.  A difference of  2-3 points is a clinically meaningful difference.  A difference of  2-3 points in the total score of the Quality of Life Index has been associated with significant improvement in overall quality of life, self-image, physical symptoms, and general health in studies assessing change in quality of life.  PHQ-9: Recent Review Flowsheet Data    Depression screen [facility] 2/9 09/24/2017   Decreased Interest 0   Down, Depressed, Hopeless 0   PHQ - 2 Score 0     Interpretation of Total Score  Total Score Depression Severity:  1-4 = Minimal depression, 5-9 = Mild depression, 10-14 = Moderate depression, 15-19 = Moderately severe depression, 20-27 = Severe depression   Psychosocial Evaluation and Intervention:   Psychosocial Re-Evaluation:   Psychosocial Discharge (Final Psychosocial Re-Evaluation):   Vocational Rehabilitation: Provide vocational rehab assistance to qualifying candidates.   Vocational Rehab Evaluation & Intervention: Vocational Rehab - 09/20/17 1214      Initial Vocational Rehab Evaluation & Intervention   Assessment shows need for Vocational Rehabilitation  No       Education: Education Goals: Education classes will be provided on a weekly basis, covering required topics. Participant will state understanding/return demonstration of topics presented.  Learning Barriers/Preferences: Learning Barriers/Preferences - 09/20/17 0849      Learning Barriers/Preferences   Learning Barriers  Sight    Learning Preferences  Written Material;Individual Instruction;Skilled Demonstration       Education Topics: Count Your Pulse:  -Group instruction provided by verbal instruction, demonstration, patient participation and written materials to support subject.  Instructors address importance of being able to find your pulse and how to count your pulse when at home without a heart monitor.  Patients get hands on experience counting their pulse with staff help and individually.  Heart Attack, Angina, and Risk Factor Modification:  -Group instruction  provided by verbal instruction, video, and written materials to support subject.  Instructors address signs and symptoms of angina and heart attacks.    Also discuss risk factors for heart disease and how to make changes to improve heart health risk factors.   Functional Fitness:  -Group instruction provided by verbal instruction, demonstration, patient participation, and written materials to support subject.  Instructors address safety measures for doing things around the house.  Discuss how to get up and down off the floor, how to pick things up properly, how to safely get out of a chair without assistance, and balance training.   Meditation and Mindfulness:  -Group instruction provided by verbal instruction, patient participation, and written materials to support subject.  Instructor addresses importance of mindfulness and meditation practice to help reduce stress and improve awareness.  Instructor also leads participants through a meditation exercise.    Stretching for Flexibility and Mobility:  -Group instruction provided by verbal instruction, patient participation, and written materials to support subject.  Instructors lead participants through series of stretches that are designed to increase flexibility thus improving mobility.  These stretches are additional exercise for major muscle groups that are typically performed during regular warm up and cool down.   Hands Only CPR:  -Group verbal, video, and participation provides a basic overview of AHA guidelines for community CPR. Role-play of emergencies allow participants the opportunity to practice calling for help and chest compression technique with discussion of AED use.   Hypertension: -Group verbal and written instruction that provides a basic overview of hypertension including the most recent diagnostic guidelines, risk factor reduction with self-care instructions and medication management.    Nutrition I class: Heart Healthy  Eating:  -Group instruction provided by PowerPoint slides, verbal discussion, and written materials to support subject matter. The instructor gives an explanation and review of the Therapeutic Lifestyle Changes diet recommendations, which includes a discussion on lipid goals, dietary fat, sodium, fiber, plant stanol/sterol esters, sugar, and the components of a well-balanced, healthy diet.   Nutrition II class: Lifestyle Skills:  -Group instruction provided by PowerPoint slides, verbal discussion, and written materials to support subject matter. The instructor gives an explanation and review of label reading, grocery shopping for heart health, heart healthy recipe modifications, and ways to make healthier choices when eating out.   CARDIAC REHAB PHASE II EXERCISE from 09/24/2017 in [facility] CARDIAC REHAB  Date  09/25/17  Educator  RD  Instruction Review Code  2- Demonstrated Understanding      Diabetes Question & Answer:  -Group instruction provided by PowerPoint slides, verbal discussion, and written materials to support subject matter. The instructor gives an explanation and review of diabetes co-morbidities, pre- and post-prandial blood glucose goals, pre-exercise blood glucose goals, signs, symptoms, and treatment of hypoglycemia and hyperglycemia, and foot care basics.   Diabetes Blitz:  -Group instruction provided by PowerPoint slides, verbal discussion, and written materials to support subject matter. The instructor gives an explanation and review of the physiology behind type 1 and type 2 diabetes, diabetes medications and rational behind using different medications, pre- and post-prandial blood glucose recommendations and Hemoglobin A1c goals, diabetes diet, and exercise including blood glucose guidelines for exercising safely.    Portion Distortion:  -Group instruction provided by PowerPoint slides, verbal discussion, written materials, and food models to support  subject matter. The instructor gives an explanation of serving size versus portion size, changes in portions  sizes over the last 20 years, and what consists of a serving from each food group.   Stress Management:  -Group instruction provided by verbal instruction, video, and written materials to support subject matter.  Instructors review role of stress in heart disease and how to cope with stress positively.     Exercising on Your Own:  -Group instruction provided by verbal instruction, power point, and written materials to support subject.  Instructors discuss benefits of exercise, components of exercise, frequency and intensity of exercise, and end points for exercise.  Also discuss use of nitroglycerin and activating EMS.  Review options of places to exercise outside of rehab.  Review guidelines for sex with heart disease.   Cardiac Drugs I:  -Group instruction provided by verbal instruction and written materials to support subject.  Instructor reviews cardiac drug classes: antiplatelets, anticoagulants, beta blockers, and statins.  Instructor discusses reasons, side effects, and lifestyle considerations for each drug class.   Cardiac Drugs II:  -Group instruction provided by verbal instruction and written materials to support subject.  Instructor reviews cardiac drug classes: angiotensin converting enzyme inhibitors (ACE-I), angiotensin II receptor blockers (ARBs), nitrates, and calcium channel blockers.  Instructor discusses reasons, side effects, and lifestyle considerations for each drug class.   Anatomy and Physiology of the Circulatory System:  Group verbal and written instruction and models provide basic cardiac anatomy and physiology, with the coronary electrical and arterial systems. Review of: AMI, Angina, Valve disease, Heart Failure, Peripheral Artery Disease, Cardiac Arrhythmia, Pacemakers, and the ICD.   Other Education:  -Group or individual verbal, written, or video  instructions that support the educational goals of the cardiac rehab program.   Holiday Eating Survival Tips:  -Group instruction provided by PowerPoint slides, verbal discussion, and written materials to support subject matter. The instructor gives patients tips, tricks, and techniques to help them not only survive but enjoy the holidays despite the onslaught of food that accompanies the holidays.   Knowledge Questionnaire Score: Knowledge Questionnaire Score - 09/20/17 0847      Knowledge Questionnaire Score   Pre Score  20/24       Core Components/Risk Factors/Patient Goals at Admission: Personal Goals and Risk Factors at Admission - 09/20/17 1020      Core Components/Risk Factors/Patient Goals on Admission    Weight Management  Yes;Weight Loss    Intervention  Weight Management: Provide education and appropriate resources to help participant work on and attain dietary goals.;Weight Management: Develop a combined nutrition and exercise program designed to reach desired caloric intake, while maintaining appropriate intake of nutrient and fiber, sodium and fats, and appropriate energy expenditure required for the weight goal.;Weight Management/Obesity: Establish reasonable short term and long term weight goals.    Admit Weight  166 lb 7.2 oz (75.5 kg)    Goal Weight: Short Term  156 lb (70.8 kg)    Goal Weight: Long Term  146 lb (66.2 kg)    Expected Outcomes  Short Term: Continue to assess and modify interventions until short term weight is achieved;Long Term: Adherence to nutrition and physical activity/exercise program aimed toward attainment of established weight goal;Understanding recommendations for meals to include 15-35% energy as protein, 25-35% energy from fat, 35-60% energy from carbohydrates, less than 200mg  of dietary cholesterol, 20-35 gm of total fiber daily;Weight Loss: Understanding of general recommendations for a balanced deficit meal plan, which promotes 1-2 lb weight  loss per week and includes a negative energy balance of [phone] kcal/d;Understanding of distribution of calorie intake  throughout the day with the consumption of 4-5 meals/snacks    Diabetes  Yes    Intervention  Provide education about signs/symptoms and action to take for hypo/hyperglycemia.;Provide education about proper nutrition, including hydration, and aerobic/resistive exercise prescription along with prescribed medications to achieve blood glucose in normal ranges: Fasting glucose 65-99 mg/dL    Expected Outcomes  Short Term: Participant verbalizes understanding of the signs/symptoms and immediate care of hyper/hypoglycemia, proper foot care and importance of medication, aerobic/resistive exercise and nutrition plan for blood glucose control.;Long Term: Attainment of HbA1C < 7%.    Hypertension  Yes    Intervention  Provide education on lifestyle modifcations including regular physical activity/exercise, weight management, moderate sodium restriction and increased consumption of fresh fruit, vegetables, and low fat dairy, alcohol moderation, and smoking cessation.;Monitor prescription use compliance.    Expected Outcomes  Short Term: Continued assessment and intervention until BP is < 140/14mm HG in hypertensive participants. < 130/59mm HG in hypertensive participants with diabetes, heart failure or chronic kidney disease.;Long Term: Maintenance of blood pressure at goal levels.    Lipids  Yes    Intervention  Provide education and support for participant on nutrition & aerobic/resistive exercise along with prescribed medications to achieve LDL 70mg , HDL >40mg .    Expected Outcomes  Short Term: Participant states understanding of desired cholesterol values and is compliant with medications prescribed. Participant is following exercise prescription and nutrition guidelines.;Long Term: Cholesterol controlled with medications as prescribed, with individualized exercise RX and with personalized  nutrition plan. Value goals: LDL < 70mg , HDL > 40 mg.       Core Components/Risk Factors/Patient Goals Review:    Core Components/Risk Factors/Patient Goals at Discharge (Final Review):    ITP Comments: ITP Comments    Row Name 09/20/17 0850           ITP Comments  Dr. Armanda Magic, Medical Director          Comments: Cat is off to a good start after completing 2 sessions including orientation.Gladstone Lighter, RN,BSN 09/26/2017 4:32 PM

## 2017-09-26 ENCOUNTER — Encounter (HOSPITAL_COMMUNITY)
Admission: RE | Admit: 2017-09-26 | Discharge: 2017-09-26 | Disposition: A | Discharge status: Home / Self Care | Payer: Medicare Other | Source: Ambulatory Visit | Attending: Internal Medicine | Admitting: Internal Medicine

## 2017-09-26 DIAGNOSIS — Z955 Presence of coronary angioplasty implant and graft: Secondary | ICD-10-CM | POA: Diagnosis not present

## 2017-09-26 DIAGNOSIS — Z9861 Coronary angioplasty status: Secondary | ICD-10-CM

## 2017-09-26 DIAGNOSIS — Z7902 Long term (current) use of antithrombotics/antiplatelets: Secondary | ICD-10-CM | POA: Diagnosis not present

## 2017-09-26 DIAGNOSIS — Z79899 Other long term (current) drug therapy: Secondary | ICD-10-CM | POA: Diagnosis not present

## 2017-09-26 DIAGNOSIS — Z7982 Long term (current) use of aspirin: Secondary | ICD-10-CM | POA: Diagnosis not present

## 2017-09-28 ENCOUNTER — Encounter (HOSPITAL_COMMUNITY)
Admission: RE | Admit: 2017-09-28 | Discharge: 2017-09-28 | Disposition: A | Discharge status: Home / Self Care | Payer: Medicare Other | Source: Ambulatory Visit | Attending: Internal Medicine | Admitting: Internal Medicine

## 2017-09-28 DIAGNOSIS — Z79899 Other long term (current) drug therapy: Secondary | ICD-10-CM | POA: Diagnosis not present

## 2017-09-28 DIAGNOSIS — Z7902 Long term (current) use of antithrombotics/antiplatelets: Secondary | ICD-10-CM | POA: Diagnosis not present

## 2017-09-28 DIAGNOSIS — Z7982 Long term (current) use of aspirin: Secondary | ICD-10-CM | POA: Diagnosis not present

## 2017-09-28 DIAGNOSIS — Z9861 Coronary angioplasty status: Secondary | ICD-10-CM

## 2017-09-28 DIAGNOSIS — Z955 Presence of coronary angioplasty implant and graft: Secondary | ICD-10-CM | POA: Diagnosis not present

## 2017-09-28 NOTE — Progress Notes (Signed)
Reviewed home exercise guidelines with patient including endpoints, temperature precautions, target heart rate and rate of perceived exertion. Pt is walking 1.25-1.5 hours most days of the week as her mode of home exercise. Pt also clogs and has joined a gym. Pt voices understanding of instructions given. Artist Paislinty M Kalan Yeley, MS, ACSM CEP

## 2017-10-01 ENCOUNTER — Encounter (HOSPITAL_COMMUNITY)
Admission: RE | Admit: 2017-10-01 | Discharge: 2017-10-01 | Disposition: A | Discharge status: Home / Self Care | Payer: Medicare Other | Source: Ambulatory Visit | Attending: Internal Medicine | Admitting: Internal Medicine

## 2017-10-01 DIAGNOSIS — Z7982 Long term (current) use of aspirin: Secondary | ICD-10-CM | POA: Diagnosis not present

## 2017-10-01 DIAGNOSIS — Z955 Presence of coronary angioplasty implant and graft: Secondary | ICD-10-CM | POA: Diagnosis not present

## 2017-10-01 DIAGNOSIS — Z9861 Coronary angioplasty status: Secondary | ICD-10-CM

## 2017-10-01 DIAGNOSIS — Z7902 Long term (current) use of antithrombotics/antiplatelets: Secondary | ICD-10-CM | POA: Diagnosis not present

## 2017-10-01 DIAGNOSIS — Z79899 Other long term (current) drug therapy: Secondary | ICD-10-CM | POA: Diagnosis not present

## 2017-10-03 ENCOUNTER — Encounter (HOSPITAL_COMMUNITY)
Admission: RE | Admit: 2017-10-03 | Discharge: 2017-10-03 | Disposition: A | Discharge status: Home / Self Care | Payer: Medicare Other | Source: Ambulatory Visit | Attending: Internal Medicine | Admitting: Internal Medicine

## 2017-10-03 DIAGNOSIS — Z955 Presence of coronary angioplasty implant and graft: Secondary | ICD-10-CM | POA: Diagnosis not present

## 2017-10-03 DIAGNOSIS — Z7902 Long term (current) use of antithrombotics/antiplatelets: Secondary | ICD-10-CM | POA: Diagnosis not present

## 2017-10-03 DIAGNOSIS — Z7982 Long term (current) use of aspirin: Secondary | ICD-10-CM | POA: Diagnosis not present

## 2017-10-03 DIAGNOSIS — Z9861 Coronary angioplasty status: Secondary | ICD-10-CM

## 2017-10-03 DIAGNOSIS — Z79899 Other long term (current) drug therapy: Secondary | ICD-10-CM | POA: Diagnosis not present

## 2017-10-04 DIAGNOSIS — H9313 Tinnitus, bilateral: Secondary | ICD-10-CM | POA: Diagnosis not present

## 2017-10-04 DIAGNOSIS — H903 Sensorineural hearing loss, bilateral: Secondary | ICD-10-CM | POA: Diagnosis not present

## 2017-10-05 ENCOUNTER — Encounter (HOSPITAL_COMMUNITY)
Admission: RE | Admit: 2017-10-05 | Discharge: 2017-10-05 | Disposition: A | Discharge status: Home / Self Care | Payer: Medicare Other | Source: Ambulatory Visit | Attending: Internal Medicine | Admitting: Internal Medicine

## 2017-10-05 DIAGNOSIS — Z7982 Long term (current) use of aspirin: Secondary | ICD-10-CM | POA: Insufficient documentation

## 2017-10-05 DIAGNOSIS — Z79899 Other long term (current) drug therapy: Secondary | ICD-10-CM | POA: Diagnosis not present

## 2017-10-05 DIAGNOSIS — Z955 Presence of coronary angioplasty implant and graft: Secondary | ICD-10-CM

## 2017-10-05 DIAGNOSIS — Z7902 Long term (current) use of antithrombotics/antiplatelets: Secondary | ICD-10-CM | POA: Diagnosis not present

## 2017-10-05 DIAGNOSIS — Z9861 Coronary angioplasty status: Secondary | ICD-10-CM

## 2017-10-08 ENCOUNTER — Encounter (HOSPITAL_COMMUNITY)
Admission: RE | Admit: 2017-10-08 | Discharge: 2017-10-08 | Disposition: A | Discharge status: Home / Self Care | Payer: Medicare Other | Source: Ambulatory Visit | Attending: Internal Medicine | Admitting: Internal Medicine

## 2017-10-08 DIAGNOSIS — Z79899 Other long term (current) drug therapy: Secondary | ICD-10-CM | POA: Diagnosis not present

## 2017-10-08 DIAGNOSIS — Z7982 Long term (current) use of aspirin: Secondary | ICD-10-CM | POA: Diagnosis not present

## 2017-10-08 DIAGNOSIS — Z7902 Long term (current) use of antithrombotics/antiplatelets: Secondary | ICD-10-CM | POA: Diagnosis not present

## 2017-10-08 DIAGNOSIS — Z955 Presence of coronary angioplasty implant and graft: Secondary | ICD-10-CM

## 2017-10-08 DIAGNOSIS — Z9861 Coronary angioplasty status: Secondary | ICD-10-CM

## 2017-10-10 ENCOUNTER — Encounter (HOSPITAL_COMMUNITY)
Admission: RE | Admit: 2017-10-10 | Discharge: 2017-10-10 | Disposition: A | Discharge status: Home / Self Care | Payer: Medicare Other | Source: Ambulatory Visit | Attending: Internal Medicine | Admitting: Internal Medicine

## 2017-10-10 DIAGNOSIS — Z955 Presence of coronary angioplasty implant and graft: Secondary | ICD-10-CM | POA: Diagnosis not present

## 2017-10-10 DIAGNOSIS — Z79899 Other long term (current) drug therapy: Secondary | ICD-10-CM | POA: Diagnosis not present

## 2017-10-10 DIAGNOSIS — Z7902 Long term (current) use of antithrombotics/antiplatelets: Secondary | ICD-10-CM | POA: Diagnosis not present

## 2017-10-10 DIAGNOSIS — Z7982 Long term (current) use of aspirin: Secondary | ICD-10-CM | POA: Diagnosis not present

## 2017-10-10 DIAGNOSIS — Z9861 Coronary angioplasty status: Secondary | ICD-10-CM

## 2017-10-12 ENCOUNTER — Encounter (HOSPITAL_COMMUNITY)
Admission: RE | Admit: 2017-10-12 | Discharge: 2017-10-12 | Disposition: A | Discharge status: Home / Self Care | Payer: Medicare Other | Source: Ambulatory Visit | Attending: Internal Medicine | Admitting: Internal Medicine

## 2017-10-12 DIAGNOSIS — Z955 Presence of coronary angioplasty implant and graft: Secondary | ICD-10-CM

## 2017-10-12 DIAGNOSIS — Z7902 Long term (current) use of antithrombotics/antiplatelets: Secondary | ICD-10-CM | POA: Diagnosis not present

## 2017-10-12 DIAGNOSIS — Z79899 Other long term (current) drug therapy: Secondary | ICD-10-CM | POA: Diagnosis not present

## 2017-10-12 DIAGNOSIS — Z9861 Coronary angioplasty status: Secondary | ICD-10-CM

## 2017-10-12 DIAGNOSIS — Z7982 Long term (current) use of aspirin: Secondary | ICD-10-CM | POA: Diagnosis not present

## 2017-10-12 NOTE — Progress Notes (Signed)
SIMRA FIEBIG 67 y.o. female DOB: May 04, 1951 MRN: [id number]      Nutrition Note  1. S/P PTCA (percutaneous transluminal coronary angioplasty)   2. Status post coronary artery stent placement    Nutrition Note Spoke with pt. Nutrition plan and survey reviewed with pt. Pt is following a heart healthy diet. Pt is diabetic. Pt continues to check CBG's "periodically." Pt expressed understanding of the information reviewed. Pt aware of nutrition education classes offered and plans on attending nutrition classes.  Nutrition Diagnosis ? Food-and nutrition-related knowledge deficit related to lack of exposure to information as related to diagnosis of: ? CVD ? DM ? Obesity related to excessive energy intake as evidenced by a BMI of 30.7  Nutrition Intervention ? Benefits of adopting a Heart Healthy diet discussed.  ? Pt's individual nutrition plan and goals reviewed with pt.  Nutrition Goal(s):  ? Pt to identify food quantities necessary to achieve weight loss of 6-24 lb (2.7-10.9 kg) at graduation from cardiac rehab. Goal wt of 145 lb desired.   Plan:  Pt to attend nutrition classes ? Nutrition I ? Nutrition II - met 09/25/17 ? Portion Distortion ? Diabetes Blitz - met 10/09/17  ? Diabetes Q & A - met 09/28/17 Will provide client-centered nutrition education as part of interdisciplinary care.   Monitor and evaluate progress toward nutrition goal with team.  Derek Mound, M.Ed, RD, LDN, CDE 10/12/2017 10:45 AM

## 2017-10-15 ENCOUNTER — Encounter (HOSPITAL_COMMUNITY)
Admission: RE | Admit: 2017-10-15 | Discharge: 2017-10-15 | Disposition: A | Discharge status: Home / Self Care | Payer: Medicare Other | Source: Ambulatory Visit | Attending: Internal Medicine | Admitting: Internal Medicine

## 2017-10-15 DIAGNOSIS — Z955 Presence of coronary angioplasty implant and graft: Secondary | ICD-10-CM

## 2017-10-15 DIAGNOSIS — Z7902 Long term (current) use of antithrombotics/antiplatelets: Secondary | ICD-10-CM | POA: Diagnosis not present

## 2017-10-15 DIAGNOSIS — Z79899 Other long term (current) drug therapy: Secondary | ICD-10-CM | POA: Diagnosis not present

## 2017-10-15 DIAGNOSIS — Z9861 Coronary angioplasty status: Secondary | ICD-10-CM

## 2017-10-15 DIAGNOSIS — Z7982 Long term (current) use of aspirin: Secondary | ICD-10-CM | POA: Diagnosis not present

## 2017-10-17 ENCOUNTER — Encounter (HOSPITAL_COMMUNITY)
Admission: RE | Admit: 2017-10-17 | Discharge: 2017-10-17 | Disposition: A | Discharge status: Home / Self Care | Payer: Medicare Other | Source: Ambulatory Visit | Attending: Internal Medicine | Admitting: Internal Medicine

## 2017-10-17 DIAGNOSIS — Z7902 Long term (current) use of antithrombotics/antiplatelets: Secondary | ICD-10-CM | POA: Diagnosis not present

## 2017-10-17 DIAGNOSIS — Z955 Presence of coronary angioplasty implant and graft: Secondary | ICD-10-CM | POA: Diagnosis not present

## 2017-10-17 DIAGNOSIS — Z9861 Coronary angioplasty status: Secondary | ICD-10-CM

## 2017-10-17 DIAGNOSIS — Z7982 Long term (current) use of aspirin: Secondary | ICD-10-CM | POA: Diagnosis not present

## 2017-10-17 DIAGNOSIS — Z79899 Other long term (current) drug therapy: Secondary | ICD-10-CM | POA: Diagnosis not present

## 2017-10-19 ENCOUNTER — Encounter (HOSPITAL_COMMUNITY)
Admission: RE | Admit: 2017-10-19 | Discharge: 2017-10-19 | Disposition: A | Discharge status: Home / Self Care | Payer: Medicare Other | Source: Ambulatory Visit | Attending: Internal Medicine | Admitting: Internal Medicine

## 2017-10-19 DIAGNOSIS — Z9861 Coronary angioplasty status: Secondary | ICD-10-CM

## 2017-10-19 DIAGNOSIS — Z7982 Long term (current) use of aspirin: Secondary | ICD-10-CM | POA: Diagnosis not present

## 2017-10-19 DIAGNOSIS — Z79899 Other long term (current) drug therapy: Secondary | ICD-10-CM | POA: Diagnosis not present

## 2017-10-19 DIAGNOSIS — Z955 Presence of coronary angioplasty implant and graft: Secondary | ICD-10-CM

## 2017-10-19 DIAGNOSIS — Z7902 Long term (current) use of antithrombotics/antiplatelets: Secondary | ICD-10-CM | POA: Diagnosis not present

## 2017-10-22 ENCOUNTER — Encounter (HOSPITAL_COMMUNITY)
Admission: RE | Admit: 2017-10-22 | Discharge: 2017-10-22 | Disposition: A | Discharge status: Home / Self Care | Payer: Medicare Other | Source: Ambulatory Visit | Attending: Internal Medicine | Admitting: Internal Medicine

## 2017-10-22 DIAGNOSIS — Z7902 Long term (current) use of antithrombotics/antiplatelets: Secondary | ICD-10-CM | POA: Diagnosis not present

## 2017-10-22 DIAGNOSIS — Z955 Presence of coronary angioplasty implant and graft: Secondary | ICD-10-CM

## 2017-10-22 DIAGNOSIS — Z9861 Coronary angioplasty status: Secondary | ICD-10-CM

## 2017-10-22 DIAGNOSIS — Z7982 Long term (current) use of aspirin: Secondary | ICD-10-CM | POA: Diagnosis not present

## 2017-10-22 DIAGNOSIS — Z79899 Other long term (current) drug therapy: Secondary | ICD-10-CM | POA: Diagnosis not present

## 2017-10-24 ENCOUNTER — Encounter (HOSPITAL_COMMUNITY)
Admission: RE | Admit: 2017-10-24 | Discharge: 2017-10-24 | Disposition: A | Discharge status: Home / Self Care | Payer: Medicare Other | Source: Ambulatory Visit | Attending: Internal Medicine | Admitting: Internal Medicine

## 2017-10-24 DIAGNOSIS — Z955 Presence of coronary angioplasty implant and graft: Secondary | ICD-10-CM

## 2017-10-24 DIAGNOSIS — Z9861 Coronary angioplasty status: Secondary | ICD-10-CM

## 2017-10-24 DIAGNOSIS — Z7902 Long term (current) use of antithrombotics/antiplatelets: Secondary | ICD-10-CM | POA: Diagnosis not present

## 2017-10-24 DIAGNOSIS — Z7982 Long term (current) use of aspirin: Secondary | ICD-10-CM | POA: Diagnosis not present

## 2017-10-24 DIAGNOSIS — Z79899 Other long term (current) drug therapy: Secondary | ICD-10-CM | POA: Diagnosis not present

## 2017-10-25 NOTE — Progress Notes (Signed)
Cardiac Individual Treatment Plan  Patient Details  Name: Kerry Perez MRN: [id number] Date of Birth: 08/07/1951 Referring Provider:     CARDIAC REHAB PHASE II ORIENTATION from 09/20/2017 in [facility] CARDIAC [facility]  Referring Provider  End, Kerry Perez      Initial Encounter Date:    CARDIAC REHAB PHASE II ORIENTATION from 09/20/2017 in [facility] CARDIAC REHAB  Date  09/20/17  Referring Provider  End, Kerry Perez      Visit Diagnosis: S/P PTCA (percutaneous transluminal coronary angioplasty)  Status post coronary artery stent placement  Patient's Home Medications on Admission:  Current Outpatient Medications:  .  acetaminophen (TYLENOL) 325 MG tablet, Take 650 mg by mouth every 6 (six) hours as needed for mild pain or headache. , Disp: , Rfl:  .  aspirin 81 MG tablet, Take 81 mg by mouth daily., Disp: , Rfl:  .  atorvastatin (LIPITOR) 80 MG tablet, Take 1 tablet (80 mg total) by mouth daily., Disp: 90 tablet, Rfl: 2 .  CALCIUM PO, Take 1 tablet by mouth daily., Disp: , Rfl:  .  Cholecalciferol (VITAMIN D) 2000 UNITS CAPS, Take 1 capsule by mouth daily. , Disp: , Rfl:  .  clopidogrel (PLAVIX) 75 MG tablet, Take 1 tablet (75 mg total) by mouth daily with breakfast., Disp: 30 tablet, Rfl: 11 .  ezetimibe (ZETIA) 10 MG tablet, Take 1 tablet (10 mg total) by mouth daily., Disp: 90 tablet, Rfl: 2 .  furosemide (LASIX) 40 MG tablet, Take 1 tablet (40 mg total) by mouth daily as needed (shortness of breath, fluid weight gain or fluid retention)., Disp: 30 tablet, Rfl: 11 .  losartan-hydrochlorothiazide (HYZAAR) 100-25 MG tablet, Take 1 tablet by mouth daily., Disp: , Rfl:  .  metoprolol succinate (TOPROL-XL) 25 MG 24 hr tablet, Take 1 tablet (25 mg total) by mouth daily., Disp: 90 tablet, Rfl: 3 .  Multiple Vitamins-Minerals (MULTIVITAMIN PO), Take 1 tablet by mouth daily. , Disp: , Rfl:  .  nitroGLYCERIN (NITROSTAT) 0.4 MG SL tablet, Place  1 tablet (0.4 mg total) under the tongue every 5 (five) minutes as needed for chest pain (up to 3 doses)., Disp: 25 tablet, Rfl: 5 .  ONE TOUCH ULTRA TEST test strip, USE TO TEST BLOOD SUGAR ONCE A DAY, Disp: , Rfl: 3  Past Medical History: Past Medical History:  Diagnosis Date  . Abnormal cardiac CT angiography    a. Ancillary findings: prominent hilar lymphoid tissue nonspecific - possibly related to CHF and lymphatic congestion, clinical correlation for signs/sx of ymphoproliferative disorder or other malignancy is suggested - denied constitutional sx and will f/u with PCP.  Marland Kitchen Anemia   . Aortic stenosis    a. Mild by echo 10/2013.  Marland Kitchen Arthritis    "knees" (05/03/2017)   . CAD (coronary artery disease)    a. Moderate LAD lesion by cath 10/15/13 with nonischemic nuclear stress test 10/17/13  . Diastolic CHF (HCC)    a. Dx 10/2013 - normal EF but elevated LVEDP by cath.  . GERD (gastroesophageal reflux disease)    mild  . Heart failure (HCC)   . Heart murmur   . History of blood transfusion 1965   "related to OR"  . Hypercholesterolemia   . Hypertension   . Migraine    "they've faded out; might have 1/year; if that; not as strong as when I was a kid" (05/03/2017)  . S/P angioplasty with stent 05/03/17 to mLAD with DES and ostium  of D2 with 50% residual stenosis.   05/04/2017  . Type 2 diabetes, diet controlled (HCC)     Tobacco Use: Social History   Tobacco Use  Smoking Status Never Smoker  Smokeless Tobacco Never Used    Labs: Recent Review Flowsheet Data    Labs for ITP Cardiac and Pulmonary Rehab Latest Ref Rng & Units 10/17/2013 08/10/2014 08/13/2015   Cholestrol 125 - 200 mg/dL [id number]   LDLCALC <782<130 mg/dL 58 69 58   HDL >=95>=46 mg/dL 60 [id number] 87   Trlycerides <150 mg/dL 98 08.679.0 56      Capillary Blood Glucose: Lab Results  Component Value Date   GLUCAP 123 (H) 09/24/2017   GLUCAP 110 (H) 09/24/2017   GLUCAP 77 05/04/2017   GLUCAP 95 05/03/2017   GLUCAP 134 (H)  05/03/2017     Exercise Target Goals:    Exercise Program Goal: Individual exercise prescription set using results from initial 6 min walk test and THRR while considering  patient's activity barriers and safety.   Exercise Prescription Goal: Initial exercise prescription builds to 30-45 minutes a day of aerobic activity, 2-3 days per week.  Home exercise guidelines will be given to patient during program as part of exercise prescription that the participant will acknowledge.  Activity Barriers & Risk Stratification: Activity Barriers & Cardiac Risk Stratification - 09/20/17 0850      Activity Barriers & Cardiac Risk Stratification   Activity Barriers  Other (comment)    Comments  Bilateral Knee Pain    Cardiac Risk Stratification  High       6 Minute Walk: 6 Minute Walk    Row Name 09/20/17 1017         6 Minute Walk   Phase  Initial     Distance  1768 feet     Walk Time  6 minutes     # of Rest Breaks  0     MPH  3.35     METS  3.81     RPE  12     Perceived Dyspnea   0     VO2 Peak  13.33     Symptoms  No     Resting HR  60 bpm     Resting BP  124/70     Resting Oxygen Saturation   100 %     Exercise Oxygen Saturation  during 6 min walk  99 %     Max Ex. HR  107 bpm     Max Ex. BP  140/80     2 Minute Post BP  120/60        Oxygen Initial Assessment:   Oxygen Re-Evaluation:   Oxygen Discharge (Final Oxygen Re-Evaluation):   Initial Exercise Prescription: Initial Exercise Prescription - 09/20/17 1000      Date of Initial Exercise RX and Referring Provider   Date  09/20/17    Referring Provider  End, Kerry Perez      Recumbant Bike   Level  2    RPM  70    Watts  30    Minutes  10    METs  3.26      NuStep   Level  3    SPM  70    Minutes  10    METs  3      Track   Laps  13    Minutes  10    METs  3.3      Prescription  Details   Frequency (times per week)  3x    Duration  Progress to 45 minutes of aerobic exercise without  signs/symptoms of physical distress      Intensity   THRR 40-80% of Max Heartrate  61-122    Ratings of Perceived Exertion  11-15    Perceived Dyspnea  0-4      Progression   Progression  Continue progressive overload as per policy without signs/symptoms or physical distress.      Resistance Training   Training Prescription  Yes    Weight  3lbs    Reps  10-15       Perform Capillary Blood Glucose checks as needed.  Exercise Prescription Changes: Exercise Prescription Changes    Row Name 09/24/17 0955 10/01/17 0950 10/19/17 0943         Response to Exercise   Blood Pressure (Admit)  124/72  132/70  118/62     Blood Pressure (Exercise)  150/70  140/60  162/70     Blood Pressure (Exit)  134/70  120/60  108/78     Heart Rate (Admit)  65 bpm  60 bpm  61 bpm     Heart Rate (Exercise)  101 bpm  111 bpm  115 bpm     Heart Rate (Exit)  61 bpm  62 bpm  63 bpm     Rating of Perceived Exertion (Exercise)  14  14  13      Symptoms  none  none  none     Duration  Continue with 30 min of aerobic exercise without signs/symptoms of physical distress.  Continue with 30 min of aerobic exercise without signs/symptoms of physical distress.  Continue with 30 min of aerobic exercise without signs/symptoms of physical distress.     Intensity  THRR unchanged  THRR unchanged  THRR unchanged       Progression   Progression  Continue to progress workloads to maintain intensity without signs/symptoms of physical distress.  Continue to progress workloads to maintain intensity without signs/symptoms of physical distress.  Continue to progress workloads to maintain intensity without signs/symptoms of physical distress.     Average METs  3.1  3.9  4.3       Resistance Training   Training Prescription  Yes  Yes  Yes     Weight  3lbs  3lbs  4lbs     Reps  10-15  10-15  10-15     Time  10 Minutes  10 Minutes  10 Minutes       Interval Training   Interval Training  No  No  No       Recumbant Bike    Level  2  3  3      RPM  70  -  -     Watts  30  -  -     Minutes  10  10  10      METs  2.2  3.5  3.6       NuStep   Level  3  4  5      SPM  70  90  90     Minutes  10  10  10      METs  3.4  4.2  5       Track   Laps  15  17  18      Minutes  10  10  10      METs  3.6  3.95  4.13  Home Exercise Plan   Plans to continue exercise at  -  Home (comment)  Home (comment)     Frequency  -  Add 4 additional days to program exercise sessions.  Add 4 additional days to program exercise sessions.     Initial Home Exercises Provided  -  09/28/17  09/28/17        Exercise Comments: Exercise Comments    Row Name 09/24/17 1418 09/28/17 1030 10/01/17 0950 10/22/17 1005     Exercise Comments  Off to a good start with exercise.  Reviewed home exercise guidelines, METs and goals with patient.  Reviewed home METs with patient.  Reviewed METs and goals with patient.       Exercise Goals and Review: Exercise Goals    Row Name 09/20/17 1013             Exercise Goals   Increase Physical Activity  Yes       Intervention  Provide advice, education, support and counseling about physical activity/exercise needs.;Develop an individualized exercise prescription for aerobic and resistive training based on initial evaluation findings, risk stratification, comorbidities and participant's personal goals.       Expected Outcomes  Short Term: Attend rehab on a regular basis to increase amount of physical activity.;Long Term: Add in home exercise to make exercise part of routine and to increase amount of physical activity.;Long Term: Exercising regularly at least 3-5 days a week.       Increase Strength and Stamina  Yes       Intervention  Provide advice, education, support and counseling about physical activity/exercise needs.;Develop an individualized exercise prescription for aerobic and resistive training based on initial evaluation findings, risk stratification, comorbidities and participant's  personal goals.       Expected Outcomes  Short Term: Increase workloads from initial exercise prescription for resistance, speed, and METs.;Short Term: Perform resistance training exercises routinely during rehab and add in resistance training at home;Long Term: Improve cardiorespiratory fitness, muscular endurance and strength as measured by increased METs and functional capacity ( )       Able to understand and use rate of perceived exertion (RPE) scale  Yes       Intervention  Provide education and explanation on how to use RPE scale       Expected Outcomes  Short Term: Able to use RPE daily in rehab to express subjective intensity level;Long Term:  Able to use RPE to guide intensity level when exercising independently       Intervention  Provide education and explanation on how to use Dyspnea scale       Expected Outcomes  Short Term: Able to use Dyspnea scale daily in rehab to express subjective sense of shortness of breath during exertion;Long Term: Able to use Dyspnea scale to guide intensity level when exercising independently       Knowledge and understanding of Target Heart Rate Range (THRR)  Yes       Intervention  Provide education and explanation of THRR including how the numbers were predicted and where they are located for reference       Expected Outcomes  Short Term: Able to state/look up THRR;Long Term: Able to use THRR to govern intensity when exercising independently;Short Term: Able to use daily as guideline for intensity in rehab       Able to check pulse independently  Yes       Intervention  Provide education and demonstration on how to check pulse in carotid  and radial arteries.;Review the importance of being able to check your own pulse for safety during independent exercise       Expected Outcomes  Short Term: Able to explain why pulse checking is important during independent exercise;Long Term: Able to check pulse independently and accurately       Understanding of  Exercise Prescription  Yes       Intervention  Provide education, explanation, and written materials on patient's individual exercise prescription       Expected Outcomes  Short Term: Able to explain program exercise prescription;Long Term: Able to explain home exercise prescription to exercise independently          Exercise Goals Re-Evaluation : Exercise Goals Re-Evaluation    Row Name 09/24/17 1414 09/28/17 1030 10/22/17 1005         Exercise Goal Re-Evaluation   Exercise Goals Review  Able to understand and use rate of perceived exertion (RPE) scale  Able to understand and use rate of perceived exertion (RPE) scale;Understanding of Exercise Prescription;Knowledge and understanding of Target Heart Rate Range (THRR)  Understanding of Exercise Prescription     Comments  Patient tolerated first full day of exercise without c/o. Pt able to use RPE scale appropriately.  Reviewed home exercise guidelines with patient including THRR, RPE scale and endpoints for exercise. Pt is walking 1.25-1.5 hours most days of the week. Pt also clogs and has joined a gym.  Patient is progressing well with exercise. Patient walks daily 60-90 minutes. Patient also clogs and walks 30-40 minutes on those days.     Expected Outcomes  Increase workloads as tolerated to achieve health and fitness goals.  Patient will continue exercise at least 30 minutes 6-7 days/week.  Continue daily exercise routine to continue progress with exercise.         Discharge Exercise Prescription (Final Exercise Prescription Changes): Exercise Prescription Changes - 10/19/17 0943      Response to Exercise   Blood Pressure (Admit)  118/62    Blood Pressure (Exercise)  162/70    Blood Pressure (Exit)  108/78    Heart Rate (Admit)  61 bpm    Heart Rate (Exercise)  115 bpm    Heart Rate (Exit)  63 bpm    Rating of Perceived Exertion (Exercise)  13    Symptoms  none    Duration  Continue with 30 min of aerobic exercise without  signs/symptoms of physical distress.    Intensity  THRR unchanged      Progression   Progression  Continue to progress workloads to maintain intensity without signs/symptoms of physical distress.    Average METs  4.3      Resistance Training   Training Prescription  Yes    Weight  4lbs    Reps  10-15    Time  10 Minutes      Interval Training   Interval Training  No      Recumbant Bike   Level  3    Minutes  10    METs  3.6      NuStep   Level  5    SPM  90    Minutes  10    METs  5      Track   Laps  18    Minutes  10    METs  4.13      Home Exercise Plan   Plans to continue exercise at  Home (comment)    Frequency  Add 4  additional days to program exercise sessions.    Initial Home Exercises Provided  09/28/17       Nutrition:  Target Goals: Understanding of nutrition guidelines, daily intake of sodium 1500mg , cholesterol 200mg , calories 30% from fat and 7% or less from saturated fats, daily to have 5 or more servings of fruits and vegetables.  Biometrics: Pre Biometrics - 09/20/17 1042      Pre Biometrics   Height  5' 2.5" (1.588 m)    Weight  166 lb 7.2 oz (75.5 kg)    Waist Circumference  36.5 inches    Hip Circumference  43 inches    Waist to Hip Ratio  0.85 %    BMI (Calculated)  29.94    Triceps Skinfold  45 mm    % Body Fat  43.5 %    Grip Strength  32 kg    Flexibility  14 in    Single Leg Stand  30 seconds        Nutrition Therapy Plan and Nutrition Goals: Nutrition Therapy & Goals - 09/21/17 0921      Nutrition Therapy   Diet  Carb Modified, Heart Healthy      Personal Nutrition Goals   Nutrition Goal  Pt to identify food quantities necessary to achieve weight loss of 6-24 lb (2.7-10.9 kg) at graduation from cardiac rehab. Goal wt of 145 lb desired.       Intervention Plan   Intervention  Prescribe, educate and counsel regarding individualized specific dietary modifications aiming towards targeted core components such as weight,  hypertension, lipid management, diabetes, heart failure and other comorbidities.    Expected Outcomes  Short Term Goal: Understand basic principles of dietary content, such as calories, fat, sodium, cholesterol and nutrients.;Long Term Goal: Adherence to prescribed nutrition plan.       Nutrition Assessments: Nutrition Assessments - 09/21/17 0921      MEDFICTS Scores   Pre Score  21       Nutrition Goals Re-Evaluation:   Nutrition Goals Re-Evaluation:   Nutrition Goals Discharge (Final Nutrition Goals Re-Evaluation):   Psychosocial: Target Goals: Acknowledge presence or absence of significant depression and/or stress, maximize coping skills, provide positive support system. Participant is able to verbalize types and ability to use techniques and skills needed for reducing stress and depression.  Initial Review & Psychosocial Screening: Initial Psych Review & Screening - 09/20/17 1213      Initial Review   Current issues with  None Identified      Family Dynamics   Good Support System?  Yes      Barriers   Psychosocial barriers to participate in program  The patient should benefit from training in stress management and relaxation.      Screening Interventions   Interventions  Encouraged to exercise    Expected Outcomes  Long Term goal: The participant improves quality of Life and PHQ9 Scores as seen by post scores and/or verbalization of changes;Short Term goal: Identification and review with participant of any Quality of Life or Depression concerns found by scoring the questionnaire.       Quality of Life Scores: Quality of Life - 09/20/17 0847      Quality of Life Scores   Health/Function Pre  21.4 %    Socioeconomic Pre  25.71 %    Psych/Spiritual Pre  22.67 %    Family Pre  28.8 %    GLOBAL Pre  23.67 %      Scores of 19 and  below usually indicate a poorer quality of life in these areas.  A difference of  2-3 points is a clinically meaningful difference.  A  difference of 2-3 points in the total score of the Quality of Life Index has been associated with significant improvement in overall quality of life, self-image, physical symptoms, and general health in studies assessing change in quality of life.  PHQ-9: Recent Review Flowsheet Data    Depression screen [facility]/9 09/24/2017   Decreased Interest 0   Down, Depressed, Hopeless 0   PHQ - 2 Score 0     Interpretation of Total Score  Total Score Depression Severity:  1-4 = Minimal depression, 5-9 = Mild depression, 10-14 = Moderate depression, 15-19 = Moderately severe depression, 20-27 = Severe depression   Psychosocial Evaluation and Intervention:   Psychosocial Re-Evaluation: Psychosocial Re-Evaluation    Row Name 10/25/17 1005             Psychosocial Re-Evaluation   Current issues with  None Identified       Interventions  Encouraged to attend Cardiac Rehabilitation for the exercise       Continue Psychosocial Services   No Follow up required          Psychosocial Discharge (Final Psychosocial Re-Evaluation): Psychosocial Re-Evaluation - 10/25/17 1005      Psychosocial Re-Evaluation   Current issues with  None Identified    Interventions  Encouraged to attend Cardiac Rehabilitation for the exercise    Continue Psychosocial Services   No Follow up required       Vocational Rehabilitation: Provide vocational rehab assistance to qualifying candidates.   Vocational Rehab Evaluation & Intervention: Vocational Rehab - 09/20/17 1214      Initial Vocational Rehab Evaluation & Intervention   Assessment shows need for Vocational Rehabilitation  No       Education: Education Goals: Education classes will be provided on a weekly basis, covering required topics. Participant will state understanding/return demonstration of topics presented.  Learning Barriers/Preferences: Learning Barriers/Preferences - 09/20/17 0849      Learning Barriers/Preferences   Learning Barriers   Sight    Learning Preferences  Written Material;Individual Instruction;Skilled Demonstration       Education Topics: Count Your Pulse:  -Group instruction provided by verbal instruction, demonstration, patient participation and written materials to support subject.  Instructors address importance of being able to find your pulse and how to count your pulse when at home without a heart monitor.  Patients get hands on experience counting their pulse with staff help and individually.   Heart Attack, Angina, and Risk Factor Modification:  -Group instruction provided by verbal instruction, video, and written materials to support subject.  Instructors address signs and symptoms of angina and heart attacks.    Also discuss risk factors for heart disease and how to make changes to improve heart health risk factors.   CARDIAC REHAB PHASE II EXERCISE from 10/24/2017 in [facility] CARDIAC REHAB  Date  10/03/17  Instruction Review Code  2- Demonstrated Understanding      Functional Fitness:  -Group instruction provided by verbal instruction, demonstration, patient participation, and written materials to support subject.  Instructors address safety measures for doing things around the house.  Discuss how to get up and down off the floor, how to pick things up properly, how to safely get out of a chair without assistance, and balance training.   CARDIAC REHAB PHASE II EXERCISE from 10/24/2017 in [facility] CARDIAC [facility]  Date  10/19/17  Instruction Review Code  2- Demonstrated Understanding      Meditation and Mindfulness:  -Group instruction provided by verbal instruction, patient participation, and written materials to support subject.  Instructor addresses importance of mindfulness and meditation practice to help reduce stress and improve awareness.  Instructor also leads participants through a meditation exercise.    CARDIAC REHAB PHASE II EXERCISE from 10/24/2017  in [facility] CARDIAC REHAB  Date  10/24/17  Educator  Theda Belfast   Instruction Review Code  2- Demonstrated Understanding      Stretching for Flexibility and Mobility:  -Group instruction provided by verbal instruction, patient participation, and written materials to support subject.  Instructors lead participants through series of stretches that are designed to increase flexibility thus improving mobility.  These stretches are additional exercise for major muscle groups that are typically performed during regular warm up and cool down.   Hands Only CPR:  -Group verbal, video, and participation provides a basic overview of AHA guidelines for community CPR. Role-play of emergencies allow participants the opportunity to practice calling for help and chest compression technique with discussion of AED use.   Hypertension: -Group verbal and written instruction that provides a basic overview of hypertension including the most recent diagnostic guidelines, risk factor reduction with self-care instructions and medication management.    Nutrition I class: Heart Healthy Eating:  -Group instruction provided by PowerPoint slides, verbal discussion, and written materials to support subject matter. The instructor gives an explanation and review of the Therapeutic Lifestyle Changes diet recommendations, which includes a discussion on lipid goals, dietary fat, sodium, fiber, plant stanol/sterol esters, sugar, and the components of a well-balanced, healthy diet.   CARDIAC REHAB PHASE II EXERCISE from 10/24/2017 in [facility] CARDIAC REHAB  Date  10/23/17  Educator  RD  Instruction Review Code  2- Demonstrated Understanding      Nutrition II class: Lifestyle Skills:  -Group instruction provided by PowerPoint slides, verbal discussion, and written materials to support subject matter. The instructor gives an explanation and review of label reading, grocery shopping for  heart health, heart healthy recipe modifications, and ways to make healthier choices when eating out.   CARDIAC REHAB PHASE II EXERCISE from 10/24/2017 in [facility] CARDIAC REHAB  Date  09/25/17  Educator  RD  Instruction Review Code  2- Demonstrated Understanding      Diabetes Question & Answer:  -Group instruction provided by PowerPoint slides, verbal discussion, and written materials to support subject matter. The instructor gives an explanation and review of diabetes co-morbidities, pre- and post-prandial blood glucose goals, pre-exercise blood glucose goals, signs, symptoms, and treatment of hypoglycemia and hyperglycemia, and foot care basics.   CARDIAC REHAB PHASE II EXERCISE from 10/24/2017 in [facility] CARDIAC REHAB  Date  09/28/17  Educator  RD  Instruction Review Code  2- Demonstrated Understanding      Diabetes Blitz:  -Group instruction provided by PowerPoint slides, verbal discussion, and written materials to support subject matter. The instructor gives an explanation and review of the physiology behind type 1 and type 2 diabetes, diabetes medications and rational behind using different medications, pre- and post-prandial blood glucose recommendations and Hemoglobin A1c goals, diabetes diet, and exercise including blood glucose guidelines for exercising safely.    CARDIAC REHAB PHASE II EXERCISE from 10/24/2017 in [facility] CARDIAC REHAB  Date  10/09/17  Educator  RD  Instruction Review Code  2-  Demonstrated Understanding      Portion Distortion:  -Group instruction provided by PowerPoint slides, verbal discussion, written materials, and food models to support subject matter. The instructor gives an explanation of serving size versus portion size, changes in portions sizes over the last 20 years, and what consists of a serving from each food group.   Stress Management:  -Group instruction provided by verbal  instruction, video, and written materials to support subject matter.  Instructors review role of stress in heart disease and how to cope with stress positively.     Exercising on Your Own:  -Group instruction provided by verbal instruction, power point, and written materials to support subject.  Instructors discuss benefits of exercise, components of exercise, frequency and intensity of exercise, and end points for exercise.  Also discuss use of nitroglycerin and activating EMS.  Review options of places to exercise outside of rehab.  Review guidelines for sex with heart disease.   CARDIAC REHAB PHASE II EXERCISE from 10/24/2017 in [facility] CARDIAC REHAB  Date  10/10/17  Instruction Review Code  2- Demonstrated Understanding      Cardiac Drugs I:  -Group instruction provided by verbal instruction and written materials to support subject.  Instructor reviews cardiac drug classes: antiplatelets, anticoagulants, beta blockers, and statins.  Instructor discusses reasons, side effects, and lifestyle considerations for each drug class.   Cardiac Drugs II:  -Group instruction provided by verbal instruction and written materials to support subject.  Instructor reviews cardiac drug classes: angiotensin converting enzyme inhibitors (ACE-I), angiotensin II receptor blockers (ARBs), nitrates, and calcium channel blockers.  Instructor discusses reasons, side effects, and lifestyle considerations for each drug class.   Anatomy and Physiology of the Circulatory System:  Group verbal and written instruction and models provide basic cardiac anatomy and physiology, with the coronary electrical and arterial systems. Review of: AMI, Angina, Valve disease, Heart Failure, Peripheral Artery Disease, Cardiac Arrhythmia, Pacemakers, and the ICD.   CARDIAC REHAB PHASE II EXERCISE from 10/24/2017 in [facility] CARDIAC REHAB  Date  09/26/17  Educator  RN  Instruction Review Code  2-  Demonstrated Understanding      Other Education:  -Group or individual verbal, written, or video instructions that support the educational goals of the cardiac rehab program.   Holiday Eating Survival Tips:  -Group instruction provided by PowerPoint slides, verbal discussion, and written materials to support subject matter. The instructor gives patients tips, tricks, and techniques to help them not only survive but enjoy the holidays despite the onslaught of food that accompanies the holidays.   Knowledge Questionnaire Score: Knowledge Questionnaire Score - 09/20/17 0847      Knowledge Questionnaire Score   Pre Score  20/24       Core Components/Risk Factors/Patient Goals at Admission: Personal Goals and Risk Factors at Admission - 09/20/17 1020      Core Components/Risk Factors/Patient Goals on Admission    Weight Management  Yes;Weight Loss    Intervention  Weight Management: Provide education and appropriate resources to help participant work on and attain dietary goals.;Weight Management: Develop a combined nutrition and exercise program designed to reach desired caloric intake, while maintaining appropriate intake of nutrient and fiber, sodium and fats, and appropriate energy expenditure required for the weight goal.;Weight Management/Obesity: Establish reasonable short term and long term weight goals.    Admit Weight  166 lb 7.2 oz (75.5 kg)    Goal Weight: Short Term  156 lb (70.8 kg)  Goal Weight: Long Term  146 lb (66.2 kg)    Expected Outcomes  Short Term: Continue to assess and modify interventions until short term weight is achieved;Long Term: Adherence to nutrition and physical activity/exercise program aimed toward attainment of established weight goal;Understanding recommendations for meals to include 15-35% energy as protein, 25-35% energy from fat, 35-60% energy from carbohydrates, less than 200mg  of dietary cholesterol, 20-35 gm of total fiber daily;Weight Loss:  Understanding of general recommendations for a balanced deficit meal plan, which promotes 1-2 lb weight loss per week and includes a negative energy balance of [phone] kcal/d;Understanding of distribution of calorie intake throughout the day with the consumption of 4-5 meals/snacks    Diabetes  Yes    Intervention  Provide education about signs/symptoms and action to take for hypo/hyperglycemia.;Provide education about proper nutrition, including hydration, and aerobic/resistive exercise prescription along with prescribed medications to achieve blood glucose in normal ranges: Fasting glucose 65-99 mg/dL    Expected Outcomes  Short Term: Participant verbalizes understanding of the signs/symptoms and immediate care of hyper/hypoglycemia, proper foot care and importance of medication, aerobic/resistive exercise and nutrition plan for blood glucose control.;Long Term: Attainment of HbA1C < 7%.    Hypertension  Yes    Intervention  Provide education on lifestyle modifcations including regular physical activity/exercise, weight management, moderate sodium restriction and increased consumption of fresh fruit, vegetables, and low fat dairy, alcohol moderation, and smoking cessation.;Monitor prescription use compliance.    Expected Outcomes  Short Term: Continued assessment and intervention until BP is < 140/40mm HG in hypertensive participants. < 130/14mm HG in hypertensive participants with diabetes, heart failure or chronic kidney disease.;Long Term: Maintenance of blood pressure at goal levels.    Lipids  Yes    Intervention  Provide education and support for participant on nutrition & aerobic/resistive exercise along with prescribed medications to achieve LDL 70mg , HDL >40mg .    Expected Outcomes  Short Term: Participant states understanding of desired cholesterol values and is compliant with medications prescribed. Participant is following exercise prescription and nutrition guidelines.;Long Term:  Cholesterol controlled with medications as prescribed, with individualized exercise RX and with personalized nutrition plan. Value goals: LDL < 70mg , HDL > 40 mg.       Core Components/Risk Factors/Patient Goals Review:  Goals and Risk Factor Review    Row Name 10/25/17 1000             Core Components/Risk Factors/Patient Goals Review   Personal Goals Review  Weight Management/Obesity;Lipids;Hypertension;Diabetes       Review  Cat's resting blood pressures have within normal liniuts at cardiac rehab. Cat has had some exertional blood pressures at cardiac rehab. Cat work's hard!       Expected Outcomes  Cat will continue to take her medcations as presribed. Cat will continue to follow a heart healthy diabetic diet will continue to monitor BP's          Core Components/Risk Factors/Patient Goals at Discharge (Final Review):  Goals and Risk Factor Review - 10/25/17 1000      Core Components/Risk Factors/Patient Goals Review   Personal Goals Review  Weight Management/Obesity;Lipids;Hypertension;Diabetes    Review  Cat's resting blood pressures have within normal liniuts at cardiac rehab. Cat has had some exertional blood pressures at cardiac rehab. Cat work's hard!    Expected Outcomes  Cat will continue to take her medcations as presribed. Cat will continue to follow a heart healthy diabetic diet will continue to monitor BP's  ITP Comments: ITP Comments    Row Name 09/20/17 0850 10/25/17 0953         ITP Comments  Dr. Armanda Magicraci Turner, Medical Director  30 day ITP Review. Patient with good attendance and participation at phase 2 cardiac rehab         Comments: See ITP comments.Gl[place] LighterMaria Gaberiel Youngblood, RN,BSN 10/25/2017 10:08 AM

## 2017-10-26 ENCOUNTER — Encounter (HOSPITAL_COMMUNITY)
Admission: RE | Admit: 2017-10-26 | Discharge: 2017-10-26 | Disposition: A | Discharge status: Home / Self Care | Payer: Medicare Other | Source: Ambulatory Visit | Attending: Internal Medicine | Admitting: Internal Medicine

## 2017-10-26 DIAGNOSIS — Z9861 Coronary angioplasty status: Secondary | ICD-10-CM

## 2017-10-26 DIAGNOSIS — Z955 Presence of coronary angioplasty implant and graft: Secondary | ICD-10-CM | POA: Diagnosis not present

## 2017-10-26 DIAGNOSIS — Z7982 Long term (current) use of aspirin: Secondary | ICD-10-CM | POA: Diagnosis not present

## 2017-10-26 DIAGNOSIS — Z79899 Other long term (current) drug therapy: Secondary | ICD-10-CM | POA: Diagnosis not present

## 2017-10-26 DIAGNOSIS — Z7902 Long term (current) use of antithrombotics/antiplatelets: Secondary | ICD-10-CM | POA: Diagnosis not present

## 2017-10-29 ENCOUNTER — Encounter (HOSPITAL_COMMUNITY)
Admission: RE | Admit: 2017-10-29 | Discharge: 2017-10-29 | Disposition: A | Discharge status: Home / Self Care | Payer: Medicare Other | Source: Ambulatory Visit | Attending: Internal Medicine | Admitting: Internal Medicine

## 2017-10-29 DIAGNOSIS — Z955 Presence of coronary angioplasty implant and graft: Secondary | ICD-10-CM

## 2017-10-29 DIAGNOSIS — Z7982 Long term (current) use of aspirin: Secondary | ICD-10-CM | POA: Diagnosis not present

## 2017-10-29 DIAGNOSIS — Z7902 Long term (current) use of antithrombotics/antiplatelets: Secondary | ICD-10-CM | POA: Diagnosis not present

## 2017-10-29 DIAGNOSIS — Z9861 Coronary angioplasty status: Secondary | ICD-10-CM

## 2017-10-29 DIAGNOSIS — Z79899 Other long term (current) drug therapy: Secondary | ICD-10-CM | POA: Diagnosis not present

## 2017-10-31 ENCOUNTER — Encounter (HOSPITAL_COMMUNITY)
Admission: RE | Admit: 2017-10-31 | Discharge: 2017-10-31 | Disposition: A | Discharge status: Home / Self Care | Payer: Medicare Other | Source: Ambulatory Visit | Attending: Internal Medicine | Admitting: Internal Medicine

## 2017-10-31 DIAGNOSIS — Z79899 Other long term (current) drug therapy: Secondary | ICD-10-CM | POA: Diagnosis not present

## 2017-10-31 DIAGNOSIS — Z955 Presence of coronary angioplasty implant and graft: Secondary | ICD-10-CM

## 2017-10-31 DIAGNOSIS — Z7902 Long term (current) use of antithrombotics/antiplatelets: Secondary | ICD-10-CM | POA: Diagnosis not present

## 2017-10-31 DIAGNOSIS — Z9861 Coronary angioplasty status: Secondary | ICD-10-CM

## 2017-10-31 DIAGNOSIS — Z7982 Long term (current) use of aspirin: Secondary | ICD-10-CM | POA: Diagnosis not present

## 2017-11-02 ENCOUNTER — Encounter (HOSPITAL_COMMUNITY)
Admission: RE | Admit: 2017-11-02 | Discharge: 2017-11-02 | Disposition: A | Discharge status: Home / Self Care | Payer: Medicare Other | Source: Ambulatory Visit | Attending: Internal Medicine | Admitting: Internal Medicine

## 2017-11-02 DIAGNOSIS — Z7982 Long term (current) use of aspirin: Secondary | ICD-10-CM | POA: Diagnosis not present

## 2017-11-02 DIAGNOSIS — Z955 Presence of coronary angioplasty implant and graft: Secondary | ICD-10-CM | POA: Diagnosis not present

## 2017-11-02 DIAGNOSIS — Z79899 Other long term (current) drug therapy: Secondary | ICD-10-CM | POA: Diagnosis not present

## 2017-11-02 DIAGNOSIS — Z9861 Coronary angioplasty status: Secondary | ICD-10-CM

## 2017-11-02 DIAGNOSIS — Z7902 Long term (current) use of antithrombotics/antiplatelets: Secondary | ICD-10-CM | POA: Diagnosis not present

## 2017-11-05 ENCOUNTER — Encounter (HOSPITAL_COMMUNITY): Payer: Medicare Other

## 2017-11-07 ENCOUNTER — Encounter (HOSPITAL_COMMUNITY): Payer: Medicare Other

## 2017-11-09 ENCOUNTER — Encounter (HOSPITAL_COMMUNITY)
Admission: RE | Admit: 2017-11-09 | Discharge: 2017-11-09 | Disposition: A | Discharge status: Home / Self Care | Payer: Medicare Other | Source: Ambulatory Visit | Attending: Internal Medicine | Admitting: Internal Medicine

## 2017-11-09 DIAGNOSIS — Z79899 Other long term (current) drug therapy: Secondary | ICD-10-CM | POA: Insufficient documentation

## 2017-11-09 DIAGNOSIS — Z955 Presence of coronary angioplasty implant and graft: Secondary | ICD-10-CM | POA: Diagnosis not present

## 2017-11-09 DIAGNOSIS — Z7902 Long term (current) use of antithrombotics/antiplatelets: Secondary | ICD-10-CM | POA: Diagnosis not present

## 2017-11-09 DIAGNOSIS — Z7982 Long term (current) use of aspirin: Secondary | ICD-10-CM | POA: Insufficient documentation

## 2017-11-09 DIAGNOSIS — Z9861 Coronary angioplasty status: Secondary | ICD-10-CM

## 2017-11-12 ENCOUNTER — Encounter (HOSPITAL_COMMUNITY)
Admission: RE | Admit: 2017-11-12 | Discharge: 2017-11-12 | Disposition: A | Discharge status: Home / Self Care | Payer: Medicare Other | Source: Ambulatory Visit | Attending: Internal Medicine | Admitting: Internal Medicine

## 2017-11-12 DIAGNOSIS — Z955 Presence of coronary angioplasty implant and graft: Secondary | ICD-10-CM | POA: Diagnosis not present

## 2017-11-12 DIAGNOSIS — Z7902 Long term (current) use of antithrombotics/antiplatelets: Secondary | ICD-10-CM | POA: Diagnosis not present

## 2017-11-12 DIAGNOSIS — Z79899 Other long term (current) drug therapy: Secondary | ICD-10-CM | POA: Diagnosis not present

## 2017-11-12 DIAGNOSIS — Z9861 Coronary angioplasty status: Secondary | ICD-10-CM

## 2017-11-12 DIAGNOSIS — Z7982 Long term (current) use of aspirin: Secondary | ICD-10-CM | POA: Diagnosis not present

## 2017-11-14 ENCOUNTER — Encounter (HOSPITAL_COMMUNITY)
Admission: RE | Admit: 2017-11-14 | Discharge: 2017-11-14 | Disposition: A | Discharge status: Home / Self Care | Payer: Medicare Other | Source: Ambulatory Visit | Attending: Internal Medicine | Admitting: Internal Medicine

## 2017-11-14 DIAGNOSIS — Z955 Presence of coronary angioplasty implant and graft: Secondary | ICD-10-CM | POA: Diagnosis not present

## 2017-11-14 DIAGNOSIS — Z9861 Coronary angioplasty status: Secondary | ICD-10-CM

## 2017-11-14 DIAGNOSIS — Z7982 Long term (current) use of aspirin: Secondary | ICD-10-CM | POA: Diagnosis not present

## 2017-11-14 DIAGNOSIS — Z79899 Other long term (current) drug therapy: Secondary | ICD-10-CM | POA: Diagnosis not present

## 2017-11-14 DIAGNOSIS — Z7902 Long term (current) use of antithrombotics/antiplatelets: Secondary | ICD-10-CM | POA: Diagnosis not present

## 2017-11-16 ENCOUNTER — Encounter (HOSPITAL_COMMUNITY)
Admission: RE | Admit: 2017-11-16 | Discharge: 2017-11-16 | Disposition: A | Discharge status: Home / Self Care | Payer: Medicare Other | Source: Ambulatory Visit | Attending: Internal Medicine | Admitting: Internal Medicine

## 2017-11-16 DIAGNOSIS — Z7902 Long term (current) use of antithrombotics/antiplatelets: Secondary | ICD-10-CM | POA: Diagnosis not present

## 2017-11-16 DIAGNOSIS — Z9861 Coronary angioplasty status: Secondary | ICD-10-CM

## 2017-11-16 DIAGNOSIS — Z955 Presence of coronary angioplasty implant and graft: Secondary | ICD-10-CM | POA: Diagnosis not present

## 2017-11-16 DIAGNOSIS — Z79899 Other long term (current) drug therapy: Secondary | ICD-10-CM | POA: Diagnosis not present

## 2017-11-16 DIAGNOSIS — Z7982 Long term (current) use of aspirin: Secondary | ICD-10-CM | POA: Diagnosis not present

## 2017-11-19 ENCOUNTER — Encounter (HOSPITAL_COMMUNITY)
Admission: RE | Admit: 2017-11-19 | Discharge: 2017-11-19 | Disposition: A | Discharge status: Home / Self Care | Payer: Medicare Other | Source: Ambulatory Visit | Attending: Internal Medicine | Admitting: Internal Medicine

## 2017-11-19 DIAGNOSIS — Z9861 Coronary angioplasty status: Secondary | ICD-10-CM

## 2017-11-19 DIAGNOSIS — Z955 Presence of coronary angioplasty implant and graft: Secondary | ICD-10-CM

## 2017-11-19 DIAGNOSIS — Z7982 Long term (current) use of aspirin: Secondary | ICD-10-CM | POA: Diagnosis not present

## 2017-11-19 DIAGNOSIS — Z79899 Other long term (current) drug therapy: Secondary | ICD-10-CM | POA: Diagnosis not present

## 2017-11-19 DIAGNOSIS — Z7902 Long term (current) use of antithrombotics/antiplatelets: Secondary | ICD-10-CM | POA: Diagnosis not present

## 2017-11-20 NOTE — Progress Notes (Signed)
Cardiac Individual Treatment Plan  Patient Details  Name: Kerry Perez MRN: [id number] Date of Birth: 16-Apr-1951 Referring Provider:     CARDIAC REHAB PHASE II [facility]ATION [facility]  Referring Provider  End, Cristal Deerhristopher MD      Initial Encounter Date:    CARDIAC REHAB PHASE II [facility]  Date  09/20/17  Referring Provider  End, Cristal Deerhristopher MD      Visit Diagnosis: Status post coronary artery stent placement  S/P PTCA (percutaneous transluminal coronary angioplasty)  Patient's Home Medications on Admission:  Current Outpatient Medications:  .  acetaminophen (TYLENOL) 325 MG tablet, Take 650 mg by mouth every 6 (six) hours as needed for mild pain or headache. , Disp: , Rfl:  .  aspirin 81 MG tablet, Take 81 mg by mouth daily., Disp: , Rfl:  .  atorvastatin (LIPITOR) 80 MG tablet, Take 1 tablet (80 mg total) by mouth daily., Disp: 90 tablet, Rfl: 2 .  CALCIUM PO, Take 1 tablet by mouth daily., Disp: , Rfl:  .  Cholecalciferol (VITAMIN D) 2000 UNITS CAPS, Take 1 capsule by mouth daily. , Disp: , Rfl:  .  clopidogrel (PLAVIX) 75 MG tablet, Take 1 tablet (75 mg total) by mouth daily with breakfast., Disp: 30 tablet, Rfl: 11 .  ezetimibe (ZETIA) 10 MG tablet, Take 1 tablet (10 mg total) by mouth daily., Disp: 90 tablet, Rfl: 2 .  furosemide (LASIX) 40 MG tablet, Take 1 tablet (40 mg total) by mouth daily as needed (shortness of breath, fluid weight gain or fluid retention)., Disp: 30 tablet, Rfl: 11 .  losartan-hydrochlorothiazide (HYZAAR) 100-25 MG tablet, Take 1 tablet by mouth daily., Disp: , Rfl:  .  metoprolol succinate (TOPROL-XL) 25 MG 24 hr tablet, Take 1 tablet (25 mg total) by mouth daily., Disp: 90 tablet, Rfl: 3 .  Multiple Vitamins-Minerals (MULTIVITAMIN PO), Take 1 tablet by mouth daily. , Disp: , Rfl:  .  nitroGLYCERIN (NITROSTAT) 0.4 MG SL tablet, Place  1 tablet (0.4 mg total) under the tongue every 5 (five) minutes as needed for chest pain (up to 3 doses)., Disp: 25 tablet, Rfl: 5 .  ONE TOUCH ULTRA TEST test strip, USE TO TEST BLOOD SUGAR ONCE A DAY, Disp: , Rfl: 3  Past Medical History: Past Medical History:  Diagnosis Date  . Abnormal cardiac CT angiography    a. Ancillary findings: prominent hilar lymphoid tissue nonspecific - possibly related to CHF and lymphatic congestion, clinical correlation for signs/sx of ymphoproliferative disorder or other malignancy is suggested - denied constitutional sx and will f/u with PCP.  Marland Kitchen. Anemia   . Aortic stenosis    a. Mild by echo 10/2013.  Marland Kitchen. Arthritis    "knees" (05/03/2017)   . CAD (coronary artery disease)    a. Moderate LAD lesion by cath 10/15/13 with nonischemic nuclear stress test 10/17/13  . Diastolic CHF (HCC)    a. Dx 10/2013 - normal EF but elevated LVEDP by cath.  . GERD (gastroesophageal reflux disease)    mild  . Heart failure (HCC)   . Heart murmur   . History of blood transfusion 1965   "related to OR"  . Hypercholesterolemia   . Hypertension   . Migraine    "they've faded out; might have 1/year; if that; not as strong as when I was a kid" (05/03/2017)  . S/P angioplasty with stent 05/03/17 to mLAD with DES and ostium  of D2 with 50% residual stenosis.   05/04/2017  . Type 2 diabetes, diet controlled (HCC)     Tobacco Use: Social History   Tobacco Use  Smoking Status Never Smoker  Smokeless Tobacco Never Used    Labs: Recent Review Flowsheet Data    Labs for ITP Cardiac and Pulmonary Rehab Latest Ref Rng & Units 10/17/2013 08/10/2014 08/13/2015   Cholestrol 125 - 200 mg/dL [id number]   LDLCALC <782<130 mg/dL 58 69 58   HDL >=95>=46 mg/dL 60 [id number] 87   Trlycerides <150 mg/dL 98 08.679.0 56      Capillary Blood Glucose: Lab Results  Component Value Date   GLUCAP 123 (H) 09/24/2017   GLUCAP 110 (H) 09/24/2017   GLUCAP 77 05/04/2017   GLUCAP 95 05/03/2017   GLUCAP 134 (H)  05/03/2017     Exercise Target Goals:    Exercise Program Goal: Individual exercise prescription set using results from initial 6 min walk test and THRR while considering  patient's activity barriers and safety.   Exercise Prescription Goal: Initial exercise prescription builds to 30-45 minutes a day of aerobic activity, 2-3 days per week.  Home exercise guidelines will be given to patient during program as part of exercise prescription that the participant will acknowledge.  Activity Barriers & Risk Stratification: Activity Barriers & Cardiac Risk Stratification - 09/20/17 0850      Activity Barriers & Cardiac Risk Stratification   Activity Barriers  Other (comment)    Comments  Bilateral Knee Pain    Cardiac Risk Stratification  High       6 Minute Walk: 6 Minute Walk    Row Name 09/20/17 1017         6 Minute Walk   Phase  Initial     Distance  1768 feet     Walk Time  6 minutes     # of Rest Breaks  0     MPH  3.35     METS  3.81     RPE  12     Perceived Dyspnea   0     VO2 Peak  13.33     Symptoms  No     Resting HR  60 bpm     Resting BP  124/70     Resting Oxygen Saturation   100 %     Exercise Oxygen Saturation  during 6 min walk  99 %     Max Ex. HR  107 bpm     Max Ex. BP  140/80     2 Minute Post BP  120/60        Oxygen Initial Assessment:   Oxygen Re-Evaluation:   Oxygen Discharge (Final Oxygen Re-Evaluation):   Initial Exercise Prescription: Initial Exercise Prescription - 09/20/17 1000      Date of Initial Exercise RX and Referring Provider   Date  09/20/17    Referring Provider  End, Cristal Deerhristopher MD      Recumbant Bike   Level  2    RPM  70    Watts  30    Minutes  10    METs  3.26      NuStep   Level  3    SPM  70    Minutes  10    METs  3      Track   Laps  13    Minutes  10    METs  3.3      Prescription  Details   Frequency (times per week)  3x    Duration  Progress to 45 minutes of aerobic exercise without  signs/symptoms of physical distress      Intensity   THRR 40-80% of Max Heartrate  61-122    Ratings of Perceived Exertion  11-15    Perceived Dyspnea  0-4      Progression   Progression  Continue progressive overload as per policy without signs/symptoms or physical distress.      Resistance Training   Training Prescription  Yes    Weight  3lbs    Reps  10-15       Perform Capillary Blood Glucose checks as needed.  Exercise Prescription Changes: Exercise Prescription Changes    Row Name 09/24/17 0955 10/01/17 0950 10/19/17 0943 10/29/17 0949 11/12/17 0950     Response to Exercise   Blood Pressure (Admit)  124/72  132/70  118/62  108/60  110/58   Blood Pressure (Exercise)  150/70  140/60  162/70  162/60  178/80   Blood Pressure (Exit)  134/70  120/60  108/78  100/54  108/74   Heart Rate (Admit)  65 bpm  60 bpm  61 bpm  62 bpm  65 bpm   Heart Rate (Exercise)  101 bpm  111 bpm  115 bpm  112 bpm  120 bpm   Heart Rate (Exit)  61 bpm  62 bpm  63 bpm  62 bpm  69 bpm   Rating of Perceived Exertion (Exercise)  14  14  13  13  13    Symptoms  none  none  none  none  none   Duration  Continue with 30 min of aerobic exercise without signs/symptoms of physical distress.  Continue with 30 min of aerobic exercise without signs/symptoms of physical distress.  Continue with 30 min of aerobic exercise without signs/symptoms of physical distress.  Continue with 30 min of aerobic exercise without signs/symptoms of physical distress.  Continue with 30 min of aerobic exercise without signs/symptoms of physical distress.   Intensity  THRR unchanged  THRR unchanged  THRR unchanged  THRR unchanged  THRR unchanged     Progression   Progression  Continue to progress workloads to maintain intensity without signs/symptoms of physical distress.  Continue to progress workloads to maintain intensity without signs/symptoms of physical distress.  Continue to progress workloads to maintain intensity without  signs/symptoms of physical distress.  Continue to progress workloads to maintain intensity without signs/symptoms of physical distress.  Continue to progress workloads to maintain intensity without signs/symptoms of physical distress.   Average METs  3.1  3.9  4.3  4.4  4.7     Resistance Training   Training Prescription  Yes  Yes  Yes  Yes  Yes   Weight  3lbs  3lbs  4lbs  5lbs  5lbs   Reps  10-15  10-15  10-15  10-15  10-15   Time  10 Minutes  10 Minutes  10 Minutes  10 Minutes  10 Minutes     Interval Training   Interval Training  No  No  No  No  No     Recumbant Bike   Level  2  3  3  3  3    RPM  70  -  -  -  -   Watts  30  -  -  -  -   Minutes  10  10  10  10  10    METs  2.2  3.5  3.6  3.6  3.9     NuStep   Level  3  4  5  5  5    SPM  70  90  90  90  90   Minutes  10  10  10  10  10    METs  3.4  4.2  5  5.5  6.1     Track   Laps  15  17  18  17  18    Minutes  10  10  10  10  10    METs  3.6  3.95  4.13  3.95  4.13     Home Exercise Plan   Plans to continue exercise at  -  Home (comment)  Home (comment)  Home (comment)  Home (comment)   Frequency  -  Add 4 additional days to program exercise sessions.  Add 4 additional days to program exercise sessions.  Add 4 additional days to program exercise sessions.  Add 4 additional days to program exercise sessions.   Initial Home Exercises Provided  -  09/28/17  09/28/17  09/28/17  09/28/17      Exercise Comments: Exercise Comments    Row Name 09/24/17 1418 09/28/17 1030 10/01/17 0950 10/22/17 1005 10/29/17 1016   Exercise Comments  Off to a good start with exercise.  Reviewed home exercise guidelines, METs and goals with patient.  Reviewed home METs with patient.  Reviewed METs and goals with patient.  Reviewed METs with patient.   Row Name 11/12/17 0950           Exercise Comments  Reviewed METs and goals with patient.          Exercise Goals and Review: Exercise Goals    Row Name 09/20/17 1013              Exercise Goals   Increase Physical Activity  Yes       Intervention  Provide advice, education, support and counseling about physical activity/exercise needs.;Develop an individualized exercise prescription for aerobic and resistive training based on initial evaluation findings, risk stratification, comorbidities and participant's personal goals.       Expected Outcomes  Short Term: Attend rehab on a regular basis to increase amount of physical activity.;Long Term: Add in home exercise to make exercise part of routine and to increase amount of physical activity.;Long Term: Exercising regularly at least 3-5 days a week.       Increase Strength and Stamina  Yes       Intervention  Provide advice, education, support and counseling about physical activity/exercise needs.;Develop an individualized exercise prescription for aerobic and resistive training based on initial evaluation findings, risk stratification, comorbidities and participant's personal goals.       Expected Outcomes  Short Term: Increase workloads from initial exercise prescription for resistance, speed, and METs.;Short Term: Perform resistance training exercises routinely during rehab and add in resistance training at home;Long Term: Improve cardiorespiratory fitness, muscular endurance and strength as measured by increased METs and functional capacity ( )       Able to understand and use rate of perceived exertion (RPE) scale  Yes       Intervention  Provide education and explanation on how to use RPE scale       Expected Outcomes  Short Term: Able to use RPE daily in rehab to express subjective intensity level;Long Term:  Able to use RPE to guide intensity level when exercising independently  Intervention  Provide education and explanation on how to use Dyspnea scale       Expected Outcomes  Short Term: Able to use Dyspnea scale daily in rehab to express subjective sense of shortness of breath during exertion;Long Term: Able to use  Dyspnea scale to guide intensity level when exercising independently       Knowledge and understanding of Target Heart Rate Range (THRR)  Yes       Intervention  Provide education and explanation of THRR including how the numbers were predicted and where they are located for reference       Expected Outcomes  Short Term: Able to state/look up THRR;Long Term: Able to use THRR to govern intensity when exercising independently;Short Term: Able to use daily as guideline for intensity in rehab       Able to check pulse independently  Yes       Intervention  Provide education and demonstration on how to check pulse in carotid and radial arteries.;Review the importance of being able to check your own pulse for safety during independent exercise       Expected Outcomes  Short Term: Able to explain why pulse checking is important during independent exercise;Long Term: Able to check pulse independently and accurately       Understanding of Exercise Prescription  Yes       Intervention  Provide education, explanation, and written materials on patient's individual exercise prescription       Expected Outcomes  Short Term: Able to explain program exercise prescription;Long Term: Able to explain home exercise prescription to exercise independently          Exercise Goals Re-Evaluation : Exercise Goals Re-Evaluation    Row Name 09/24/17 1414 09/28/17 1030 10/22/17 1005 11/12/17 0950       Exercise Goal Re-Evaluation   Exercise Goals Review  Able to understand and use rate of perceived exertion (RPE) scale  Able to understand and use rate of perceived exertion (RPE) scale;Understanding of Exercise Prescription;Knowledge and understanding of Target Heart Rate Range (THRR)  Understanding of Exercise Prescription  Understanding of Exercise Prescription    Comments  Patient tolerated first full day of exercise without c/o. Pt able to use RPE scale appropriately.  Reviewed home exercise guidelines with patient  including THRR, RPE scale and endpoints for exercise. Pt is walking 1.25-1.5 hours most days of the week. Pt also clogs and has joined a gym.  Patient is progressing well with exercise. Patient walks daily 60-90 minutes. Patient also clogs and walks 30-40 minutes on those days.  Patient feels that her exercise is going well. Pt is walking and clogging as her mode of home exercise and is doing well.    Expected Outcomes  Increase workloads as tolerated to achieve health and fitness goals.  Patient will continue exercise at least 30 minutes 6-7 days/week.  Continue daily exercise routine to continue progress with exercise.  Continue current exercise routine:  daily exercise 30-60 minutes to achieve health and fitness benefits.        Discharge Exercise Prescription (Final Exercise Prescription Changes): Exercise Prescription Changes - 11/12/17 0950      Response to Exercise   Blood Pressure (Admit)  110/58    Blood Pressure (Exercise)  178/80    Blood Pressure (Exit)  108/74    Heart Rate (Admit)  65 bpm    Heart Rate (Exercise)  120 bpm    Heart Rate (Exit)  69 bpm    Rating of  Perceived Exertion (Exercise)  13    Symptoms  none    Duration  Continue with 30 min of aerobic exercise without signs/symptoms of physical distress.    Intensity  THRR unchanged      Progression   Progression  Continue to progress workloads to maintain intensity without signs/symptoms of physical distress.    Average METs  4.7      Resistance Training   Training Prescription  Yes    Weight  5lbs    Reps  10-15    Time  10 Minutes      Interval Training   Interval Training  No      Recumbant Bike   Level  3    Minutes  10    METs  3.9      NuStep   Level  5    SPM  90    Minutes  10    METs  6.1      Track   Laps  18    Minutes  10    METs  4.13      Home Exercise Plan   Plans to continue exercise at  Home (comment)    Frequency  Add 4 additional days to program exercise sessions.     Initial Home Exercises Provided  09/28/17       Nutrition:  Target Goals: Understanding of nutrition guidelines, daily intake of sodium 1500mg , cholesterol 200mg , calories 30% from fat and 7% or less from saturated fats, daily to have 5 or more servings of fruits and vegetables.  Biometrics: Pre Biometrics - 09/20/17 1042      Pre Biometrics   Height  5' 2.5" (1.588 m)    Weight  166 lb 7.2 oz (75.5 kg)    Waist Circumference  36.5 inches    Hip Circumference  43 inches    Waist to Hip Ratio  0.85 %    BMI (Calculated)  29.94    Triceps Skinfold  45 mm    % Body Fat  43.5 %    Grip Strength  32 kg    Flexibility  14 in    Single Leg Stand  30 seconds        Nutrition Therapy Plan and Nutrition Goals: Nutrition Therapy & Goals - 09/21/17 0921      Nutrition Therapy   Diet  Carb Modified, Heart Healthy      Personal Nutrition Goals   Nutrition Goal  Pt to identify food quantities necessary to achieve weight loss of 6-24 lb (2.7-10.9 kg) at graduation from cardiac rehab. Goal wt of 145 lb desired.       Intervention Plan   Intervention  Prescribe, educate and counsel regarding individualized specific dietary modifications aiming towards targeted core components such as weight, hypertension, lipid management, diabetes, heart failure and other comorbidities.    Expected Outcomes  Short Term Goal: Understand basic principles of dietary content, such as calories, fat, sodium, cholesterol and nutrients.;Long Term Goal: Adherence to prescribed nutrition plan.       Nutrition Assessments: Nutrition Assessments - 09/21/17 0921      MEDFICTS Scores   Pre Score  21       Nutrition Goals Re-Evaluation:   Nutrition Goals Re-Evaluation:   Nutrition Goals Discharge (Final Nutrition Goals Re-Evaluation):   Psychosocial: Target Goals: Acknowledge presence or absence of significant depression and/or stress, maximize coping skills, provide positive support system. Participant  is able to verbalize types and ability to use techniques  and skills needed for reducing stress and depression.  Initial Review & Psychosocial Screening: Initial Psych Review & Screening - 09/20/17 1213      Initial Review   Current issues with  None Identified      Family Dynamics   Good Support System?  Yes      Barriers   Psychosocial barriers to participate in program  The patient should benefit from training in stress management and relaxation.      Screening Interventions   Interventions  Encouraged to exercise    Expected Outcomes  Long Term goal: The participant improves quality of Life and PHQ9 Scores as seen by post scores and/or verbalization of changes;Short Term goal: Identification and review with participant of any Quality of Life or Depression concerns found by scoring the questionnaire.       Quality of Life Scores: Quality of Life - 09/20/17 0847      Quality of Life Scores   Health/Function Pre  21.4 %    Socioeconomic Pre  25.71 %    Psych/Spiritual Pre  22.67 %    Family Pre  28.8 %    GLOBAL Pre  23.67 %      Scores of 19 and below usually indicate a poorer quality of life in these areas.  A difference of  2-3 points is a clinically meaningful difference.  A difference of 2-3 points in the total score of the Quality of Life Index has been associated with significant improvement in overall quality of life, self-image, physical symptoms, and general health in studies assessing change in quality of life.  PHQ-9: Recent Review Flowsheet Data    Depression screen [facility] 2/9 09/24/2017   Decreased Interest 0   Down, Depressed, Hopeless 0   PHQ - 2 Score 0     Interpretation of Total Score  Total Score Depression Severity:  1-4 = Minimal depression, 5-9 = Mild depression, 10-14 = Moderate depression, 15-19 = Moderately severe depression, 20-27 = Severe depression   Psychosocial Evaluation and Intervention:   Psychosocial Re-Evaluation: Psychosocial  Re-Evaluation    Row Name 10/25/17 1005 11/20/17 1433           Psychosocial Re-Evaluation   Current issues with  None Identified  None Identified      Interventions  Encouraged to attend Cardiac Rehabilitation for the exercise  Encouraged to attend Cardiac Rehabilitation for the exercise      Continue Psychosocial Services   No Follow up required  No Follow up required         Psychosocial Discharge (Final Psychosocial Re-Evaluation): Psychosocial Re-Evaluation - 11/20/17 1433      Psychosocial Re-Evaluation   Current issues with  None Identified    Interventions  Encouraged to attend Cardiac Rehabilitation for the exercise    Continue Psychosocial Services   No Follow up required       Vocational Rehabilitation: Provide vocational rehab assistance to qualifying candidates.   Vocational Rehab Evaluation & Intervention: Vocational Rehab - 09/20/17 1214      Initial Vocational Rehab Evaluation & Intervention   Assessment shows need for Vocational Rehabilitation  No       Education: Education Goals: Education classes will be provided on a weekly basis, covering required topics. Participant will state understanding/return demonstration of topics presented.  Learning Barriers/Preferences: Learning Barriers/Preferences - 09/20/17 0849      Learning Barriers/Preferences   Learning Barriers  Sight    Learning Preferences  Written Material;Individual Instruction;Skilled Demonstration  Education Topics: Count Your Pulse:  -Group instruction provided by verbal instruction, demonstration, patient participation and written materials to support subject.  Instructors address importance of being able to find your pulse and how to count your pulse when at home without a heart monitor.  Patients get hands on experience counting their pulse with staff help and individually.   Heart Attack, Angina, and Risk Factor Modification:  -Group instruction provided by verbal instruction,  video, and written materials to support subject.  Instructors address signs and symptoms of angina and heart attacks.    Also discuss risk factors for heart disease and how to make changes to improve heart health risk factors.   CARDIAC REHAB PHASE II EXERCISE from 11/02/2017 in [facility] CARDIAC REHAB  Date  10/03/17  Instruction Review Code  2- Demonstrated Understanding      Functional Fitness:  -Group instruction provided by verbal instruction, demonstration, patient participation, and written materials to support subject.  Instructors address safety measures for doing things around the house.  Discuss how to get up and down off the floor, how to pick things up properly, how to safely get out of a chair without assistance, and balance training.   CARDIAC REHAB PHASE II EXERCISE from 11/02/2017 in [facility] CARDIAC REHAB  Date  10/19/17  Instruction Review Code  2- Demonstrated Understanding      Meditation and Mindfulness:  -Group instruction provided by verbal instruction, patient participation, and written materials to support subject.  Instructor addresses importance of mindfulness and meditation practice to help reduce stress and improve awareness.  Instructor also leads participants through a meditation exercise.    CARDIAC REHAB PHASE II EXERCISE from 11/02/2017 in [facility] CARDIAC REHAB  Date  10/24/17  Educator  Theda Belfast   Instruction Review Code  2- Demonstrated Understanding      Stretching for Flexibility and Mobility:  -Group instruction provided by verbal instruction, patient participation, and written materials to support subject.  Instructors lead participants through series of stretches that are designed to increase flexibility thus improving mobility.  These stretches are additional exercise for major muscle groups that are typically performed during regular warm up and cool down.   Hands Only CPR:  -Group  verbal, video, and participation provides a basic overview of AHA guidelines for community CPR. Role-play of emergencies allow participants the opportunity to practice calling for help and chest compression technique with discussion of AED use.   Hypertension: -Group verbal and written instruction that provides a basic overview of hypertension including the most recent diagnostic guidelines, risk factor reduction with self-care instructions and medication management.   CARDIAC REHAB PHASE II EXERCISE from 11/02/2017 in [facility] CARDIAC REHAB  Date  11/02/17  Instruction Review Code  2- Demonstrated Understanding       Nutrition I class: Heart Healthy Eating:  -Group instruction provided by PowerPoint slides, verbal discussion, and written materials to support subject matter. The instructor gives an explanation and review of the Therapeutic Lifestyle Changes diet recommendations, which includes a discussion on lipid goals, dietary fat, sodium, fiber, plant stanol/sterol esters, sugar, and the components of a well-balanced, healthy diet.   CARDIAC REHAB PHASE II EXERCISE from 11/02/2017 in [facility] CARDIAC REHAB  Date  10/23/17  Educator  RD  Instruction Review Code  2- Demonstrated Understanding      Nutrition II class: Lifestyle Skills:  -Group instruction provided by PowerPoint slides, verbal discussion, and written  materials to support subject matter. The instructor gives an explanation and review of label reading, grocery shopping for heart health, heart healthy recipe modifications, and ways to make healthier choices when eating out.   CARDIAC REHAB PHASE II EXERCISE from 11/02/2017 in [facility] CARDIAC REHAB  Date  09/25/17  Educator  RD  Instruction Review Code  2- Demonstrated Understanding      Diabetes Question & Answer:  -Group instruction provided by PowerPoint slides, verbal discussion, and written materials to support  subject matter. The instructor gives an explanation and review of diabetes co-morbidities, pre- and post-prandial blood glucose goals, pre-exercise blood glucose goals, signs, symptoms, and treatment of hypoglycemia and hyperglycemia, and foot care basics.   CARDIAC REHAB PHASE II EXERCISE from 11/02/2017 in [facility] CARDIAC REHAB  Date  09/28/17  Educator  RD  Instruction Review Code  2- Demonstrated Understanding      Diabetes Blitz:  -Group instruction provided by PowerPoint slides, verbal discussion, and written materials to support subject matter. The instructor gives an explanation and review of the physiology behind type 1 and type 2 diabetes, diabetes medications and rational behind using different medications, pre- and post-prandial blood glucose recommendations and Hemoglobin A1c goals, diabetes diet, and exercise including blood glucose guidelines for exercising safely.    CARDIAC REHAB PHASE II EXERCISE from 11/02/2017 in [facility] CARDIAC REHAB  Date  10/09/17  Educator  RD  Instruction Review Code  2- Demonstrated Understanding      Portion Distortion:  -Group instruction provided by PowerPoint slides, verbal discussion, written materials, and food models to support subject matter. The instructor gives an explanation of serving size versus portion size, changes in portions sizes over the last 20 years, and what consists of a serving from each food group.   CARDIAC REHAB PHASE II EXERCISE from 11/02/2017 in [facility] CARDIAC REHAB  Date  10/31/17  Educator  RD  Instruction Review Code  2- Demonstrated Understanding      Stress Management:  -Group instruction provided by verbal instruction, video, and written materials to support subject matter.  Instructors review role of stress in heart disease and how to cope with stress positively.     Exercising on Your Own:  -Group instruction provided by verbal instruction, power  point, and written materials to support subject.  Instructors discuss benefits of exercise, components of exercise, frequency and intensity of exercise, and end points for exercise.  Also discuss use of nitroglycerin and activating EMS.  Review options of places to exercise outside of rehab.  Review guidelines for sex with heart disease.   CARDIAC REHAB PHASE II EXERCISE from 11/02/2017 in [facility] CARDIAC REHAB  Date  10/10/17  Instruction Review Code  2- Demonstrated Understanding      Cardiac Drugs I:  -Group instruction provided by verbal instruction and written materials to support subject.  Instructor reviews cardiac drug classes: antiplatelets, anticoagulants, beta blockers, and statins.  Instructor discusses reasons, side effects, and lifestyle considerations for each drug class.   Cardiac Drugs II:  -Group instruction provided by verbal instruction and written materials to support subject.  Instructor reviews cardiac drug classes: angiotensin converting enzyme inhibitors (ACE-I), angiotensin II receptor blockers (ARBs), nitrates, and calcium channel blockers.  Instructor discusses reasons, side effects, and lifestyle considerations for each drug class.   Anatomy and Physiology of the Circulatory System:  Group verbal and written instruction and models provide basic cardiac anatomy and  physiology, with the coronary electrical and arterial systems. Review of: AMI, Angina, Valve disease, Heart Failure, Peripheral Artery Disease, Cardiac Arrhythmia, Pacemakers, and the ICD.   CARDIAC REHAB PHASE II EXERCISE from 11/02/2017 in [facility] CARDIAC REHAB  Date  09/26/17  Educator  RN  Instruction Review Code  2- Demonstrated Understanding      Other Education:  -Group or individual verbal, written, or video instructions that support the educational goals of the cardiac rehab program.   Holiday Eating Survival Tips:  -Group instruction provided by  PowerPoint slides, verbal discussion, and written materials to support subject matter. The instructor gives patients tips, tricks, and techniques to help them not only survive but enjoy the holidays despite the onslaught of food that accompanies the holidays.   Knowledge Questionnaire Score: Knowledge Questionnaire Score - 09/20/17 0847      Knowledge Questionnaire Score   Pre Score  20/24       Core Components/Risk Factors/Patient Goals at Admission: Personal Goals and Risk Factors at Admission - 09/20/17 1020      Core Components/Risk Factors/Patient Goals on Admission    Weight Management  Yes;Weight Loss    Intervention  Weight Management: Provide education and appropriate resources to help participant work on and attain dietary goals.;Weight Management: Develop a combined nutrition and exercise program designed to reach desired caloric intake, while maintaining appropriate intake of nutrient and fiber, sodium and fats, and appropriate energy expenditure required for the weight goal.;Weight Management/Obesity: Establish reasonable short term and long term weight goals.    Admit Weight  166 lb 7.2 oz (75.5 kg)    Goal Weight: Short Term  156 lb (70.8 kg)    Goal Weight: Long Term  146 lb (66.2 kg)    Expected Outcomes  Short Term: Continue to assess and modify interventions until short term weight is achieved;Long Term: Adherence to nutrition and physical activity/exercise program aimed toward attainment of established weight goal;Understanding recommendations for meals to include 15-35% energy as protein, 25-35% energy from fat, 35-60% energy from carbohydrates, less than 200mg  of dietary cholesterol, 20-35 gm of total fiber daily;Weight Loss: Understanding of general recommendations for a balanced deficit meal plan, which promotes 1-2 lb weight loss per week and includes a negative energy balance of [phone] kcal/d;Understanding of distribution of calorie intake throughout the day with the  consumption of 4-5 meals/snacks    Diabetes  Yes    Intervention  Provide education about signs/symptoms and action to take for hypo/hyperglycemia.;Provide education about proper nutrition, including hydration, and aerobic/resistive exercise prescription along with prescribed medications to achieve blood glucose in normal ranges: Fasting glucose 65-99 mg/dL    Expected Outcomes  Short Term: Participant verbalizes understanding of the signs/symptoms and immediate care of hyper/hypoglycemia, proper foot care and importance of medication, aerobic/resistive exercise and nutrition plan for blood glucose control.;Long Term: Attainment of HbA1C < 7%.    Hypertension  Yes    Intervention  Provide education on lifestyle modifcations including regular physical activity/exercise, weight management, moderate sodium restriction and increased consumption of fresh fruit, vegetables, and low fat dairy, alcohol moderation, and smoking cessation.;Monitor prescription use compliance.    Expected Outcomes  Short Term: Continued assessment and intervention until BP is < 140/81mm HG in hypertensive participants. < 130/65mm HG in hypertensive participants with diabetes, heart failure or chronic kidney disease.;Long Term: Maintenance of blood pressure at goal levels.    Lipids  Yes    Intervention  Provide education and support for participant on nutrition &  aerobic/resistive exercise along with prescribed medications to achieve LDL 70mg , HDL >40mg .    Expected Outcomes  Short Term: Participant states understanding of desired cholesterol values and is compliant with medications prescribed. Participant is following exercise prescription and nutrition guidelines.;Long Term: Cholesterol controlled with medications as prescribed, with individualized exercise RX and with personalized nutrition plan. Value goals: LDL < 70mg , HDL > 40 mg.       Core Components/Risk Factors/Patient Goals Review:  Goals and Risk Factor Review    Row  Name 10/25/17 1000 11/20/17 1432           Core Components/Risk Factors/Patient Goals Review   Personal Goals Review  Weight Management/Obesity;Lipids;Hypertension;Diabetes  Weight Management/Obesity;Lipids;Hypertension;Diabetes      Review  Cat's resting blood pressures have within normal liniuts at cardiac rehab. Cat has had some exertional blood pressures at cardiac rehab. Cat work's hard!  Cat's resting blood pressures have within normal liniuts at cardiac rehab. Cat has had some exertional blood pressures at cardiac rehab. Cat work's hard! Dr Delton See is aware of Cat's intermittent exertional bp elevations.      Expected Outcomes  Cat will continue to take her medcations as presribed. Cat will continue to follow a heart healthy diabetic diet will continue to monitor BP's  Cat will continue to take her medcations as presribed. Cat will continue to follow a heart healthy diabetic diet will continue to monitor BP's         Core Components/Risk Factors/Patient Goals at Discharge (Final Review):  Goals and Risk Factor Review - 11/20/17 1432      Core Components/Risk Factors/Patient Goals Review   Personal Goals Review  Weight Management/Obesity;Lipids;Hypertension;Diabetes    Review  Cat's resting blood pressures have within normal liniuts at cardiac rehab. Cat has had some exertional blood pressures at cardiac rehab. Cat work's hard! Dr Delton See is aware of Cat's intermittent exertional bp elevations.    Expected Outcomes  Cat will continue to take her medcations as presribed. Cat will continue to follow a heart healthy diabetic diet will continue to monitor BP's       ITP Comments: ITP Comments    Row Name 09/20/17 0850 10/25/17 0953 11/20/17 1432       ITP Comments  Dr. Armanda Magic, Medical Director  30 day ITP Review. Patient with good attendance and participation at phase 2 cardiac rehab  30 day ITP Review. Patient with good attendance and participation at phase 2 cardiac rehab         Comments: See ITP comments.Gladstone Lighter, RN,BSN 11/20/2017 2:41 PM

## 2017-11-21 ENCOUNTER — Encounter (HOSPITAL_COMMUNITY)
Admission: RE | Admit: 2017-11-21 | Discharge: 2017-11-21 | Disposition: A | Discharge status: Home / Self Care | Payer: Medicare Other | Source: Ambulatory Visit | Attending: Internal Medicine | Admitting: Internal Medicine

## 2017-11-21 DIAGNOSIS — Z7982 Long term (current) use of aspirin: Secondary | ICD-10-CM | POA: Diagnosis not present

## 2017-11-21 DIAGNOSIS — Z7902 Long term (current) use of antithrombotics/antiplatelets: Secondary | ICD-10-CM | POA: Diagnosis not present

## 2017-11-21 DIAGNOSIS — Z955 Presence of coronary angioplasty implant and graft: Secondary | ICD-10-CM

## 2017-11-21 DIAGNOSIS — Z79899 Other long term (current) drug therapy: Secondary | ICD-10-CM | POA: Diagnosis not present

## 2017-11-21 DIAGNOSIS — Z9861 Coronary angioplasty status: Secondary | ICD-10-CM

## 2017-11-23 ENCOUNTER — Encounter (HOSPITAL_COMMUNITY)
Admission: RE | Admit: 2017-11-23 | Discharge: 2017-11-23 | Disposition: A | Discharge status: Home / Self Care | Payer: Medicare Other | Source: Ambulatory Visit | Attending: Internal Medicine | Admitting: Internal Medicine

## 2017-11-23 DIAGNOSIS — Z955 Presence of coronary angioplasty implant and graft: Secondary | ICD-10-CM | POA: Diagnosis not present

## 2017-11-23 DIAGNOSIS — Z9861 Coronary angioplasty status: Secondary | ICD-10-CM

## 2017-11-23 DIAGNOSIS — Z79899 Other long term (current) drug therapy: Secondary | ICD-10-CM | POA: Diagnosis not present

## 2017-11-23 DIAGNOSIS — Z7902 Long term (current) use of antithrombotics/antiplatelets: Secondary | ICD-10-CM | POA: Diagnosis not present

## 2017-11-23 DIAGNOSIS — Z7982 Long term (current) use of aspirin: Secondary | ICD-10-CM | POA: Diagnosis not present

## 2017-11-26 ENCOUNTER — Encounter (HOSPITAL_COMMUNITY)
Admission: RE | Admit: 2017-11-26 | Discharge: 2017-11-26 | Disposition: A | Discharge status: Home / Self Care | Payer: Medicare Other | Source: Ambulatory Visit | Attending: Internal Medicine | Admitting: Internal Medicine

## 2017-11-26 DIAGNOSIS — Z7982 Long term (current) use of aspirin: Secondary | ICD-10-CM | POA: Diagnosis not present

## 2017-11-26 DIAGNOSIS — Z79899 Other long term (current) drug therapy: Secondary | ICD-10-CM | POA: Diagnosis not present

## 2017-11-26 DIAGNOSIS — Z955 Presence of coronary angioplasty implant and graft: Secondary | ICD-10-CM | POA: Diagnosis not present

## 2017-11-26 DIAGNOSIS — Z9861 Coronary angioplasty status: Secondary | ICD-10-CM

## 2017-11-26 DIAGNOSIS — Z7902 Long term (current) use of antithrombotics/antiplatelets: Secondary | ICD-10-CM | POA: Diagnosis not present

## 2017-11-28 ENCOUNTER — Encounter (HOSPITAL_COMMUNITY)
Admission: RE | Admit: 2017-11-28 | Discharge: 2017-11-28 | Disposition: A | Discharge status: Home / Self Care | Payer: Medicare Other | Source: Ambulatory Visit | Attending: Internal Medicine | Admitting: Internal Medicine

## 2017-11-28 DIAGNOSIS — Z955 Presence of coronary angioplasty implant and graft: Secondary | ICD-10-CM

## 2017-11-28 DIAGNOSIS — Z7982 Long term (current) use of aspirin: Secondary | ICD-10-CM | POA: Diagnosis not present

## 2017-11-28 DIAGNOSIS — Z79899 Other long term (current) drug therapy: Secondary | ICD-10-CM | POA: Diagnosis not present

## 2017-11-28 DIAGNOSIS — Z9861 Coronary angioplasty status: Secondary | ICD-10-CM

## 2017-11-28 DIAGNOSIS — Z7902 Long term (current) use of antithrombotics/antiplatelets: Secondary | ICD-10-CM | POA: Diagnosis not present

## 2017-11-30 ENCOUNTER — Encounter (HOSPITAL_COMMUNITY)
Admission: RE | Admit: 2017-11-30 | Discharge: 2017-11-30 | Disposition: A | Discharge status: Home / Self Care | Payer: Medicare Other | Source: Ambulatory Visit | Attending: Internal Medicine | Admitting: Internal Medicine

## 2017-11-30 DIAGNOSIS — Z79899 Other long term (current) drug therapy: Secondary | ICD-10-CM | POA: Diagnosis not present

## 2017-11-30 DIAGNOSIS — Z9861 Coronary angioplasty status: Secondary | ICD-10-CM

## 2017-11-30 DIAGNOSIS — Z7982 Long term (current) use of aspirin: Secondary | ICD-10-CM | POA: Diagnosis not present

## 2017-11-30 DIAGNOSIS — Z955 Presence of coronary angioplasty implant and graft: Secondary | ICD-10-CM | POA: Diagnosis not present

## 2017-11-30 DIAGNOSIS — Z7902 Long term (current) use of antithrombotics/antiplatelets: Secondary | ICD-10-CM | POA: Diagnosis not present

## 2017-12-03 ENCOUNTER — Encounter (HOSPITAL_COMMUNITY)
Admission: RE | Admit: 2017-12-03 | Discharge: 2017-12-03 | Disposition: A | Discharge status: Home / Self Care | Payer: Medicare Other | Source: Ambulatory Visit | Attending: Internal Medicine | Admitting: Internal Medicine

## 2017-12-03 DIAGNOSIS — Z955 Presence of coronary angioplasty implant and graft: Secondary | ICD-10-CM

## 2017-12-03 DIAGNOSIS — Z79899 Other long term (current) drug therapy: Secondary | ICD-10-CM | POA: Diagnosis not present

## 2017-12-03 DIAGNOSIS — Z7982 Long term (current) use of aspirin: Secondary | ICD-10-CM | POA: Diagnosis not present

## 2017-12-03 DIAGNOSIS — Z9861 Coronary angioplasty status: Secondary | ICD-10-CM

## 2017-12-03 DIAGNOSIS — Z7902 Long term (current) use of antithrombotics/antiplatelets: Secondary | ICD-10-CM | POA: Diagnosis not present

## 2017-12-04 ENCOUNTER — Other Ambulatory Visit: Payer: Self-pay | Admitting: Cardiology

## 2017-12-05 ENCOUNTER — Encounter (HOSPITAL_COMMUNITY)
Admission: RE | Admit: 2017-12-05 | Discharge: 2017-12-05 | Disposition: A | Discharge status: Home / Self Care | Payer: Medicare Other | Source: Ambulatory Visit | Attending: Internal Medicine | Admitting: Internal Medicine

## 2017-12-05 DIAGNOSIS — Z7982 Long term (current) use of aspirin: Secondary | ICD-10-CM | POA: Insufficient documentation

## 2017-12-05 DIAGNOSIS — M810 Age-related osteoporosis without current pathological fracture: Secondary | ICD-10-CM | POA: Diagnosis not present

## 2017-12-05 DIAGNOSIS — Z955 Presence of coronary angioplasty implant and graft: Secondary | ICD-10-CM | POA: Diagnosis not present

## 2017-12-05 DIAGNOSIS — Z79899 Other long term (current) drug therapy: Secondary | ICD-10-CM | POA: Insufficient documentation

## 2017-12-05 DIAGNOSIS — Z9861 Coronary angioplasty status: Secondary | ICD-10-CM

## 2017-12-05 DIAGNOSIS — Z7902 Long term (current) use of antithrombotics/antiplatelets: Secondary | ICD-10-CM | POA: Diagnosis not present

## 2017-12-07 ENCOUNTER — Encounter (HOSPITAL_COMMUNITY)
Admission: RE | Admit: 2017-12-07 | Discharge: 2017-12-07 | Disposition: A | Discharge status: Home / Self Care | Payer: Medicare Other | Source: Ambulatory Visit | Attending: Internal Medicine | Admitting: Internal Medicine

## 2017-12-07 DIAGNOSIS — Z7902 Long term (current) use of antithrombotics/antiplatelets: Secondary | ICD-10-CM | POA: Diagnosis not present

## 2017-12-07 DIAGNOSIS — Z79899 Other long term (current) drug therapy: Secondary | ICD-10-CM | POA: Diagnosis not present

## 2017-12-07 DIAGNOSIS — Z7982 Long term (current) use of aspirin: Secondary | ICD-10-CM | POA: Diagnosis not present

## 2017-12-07 DIAGNOSIS — Z955 Presence of coronary angioplasty implant and graft: Secondary | ICD-10-CM | POA: Diagnosis not present

## 2017-12-07 DIAGNOSIS — Z9861 Coronary angioplasty status: Secondary | ICD-10-CM

## 2017-12-10 ENCOUNTER — Encounter (HOSPITAL_COMMUNITY)
Admission: RE | Admit: 2017-12-10 | Discharge: 2017-12-10 | Disposition: A | Discharge status: Home / Self Care | Payer: Medicare Other | Source: Ambulatory Visit | Attending: Internal Medicine | Admitting: Internal Medicine

## 2017-12-10 DIAGNOSIS — Z9861 Coronary angioplasty status: Secondary | ICD-10-CM

## 2017-12-10 DIAGNOSIS — Z7982 Long term (current) use of aspirin: Secondary | ICD-10-CM | POA: Diagnosis not present

## 2017-12-10 DIAGNOSIS — Z955 Presence of coronary angioplasty implant and graft: Secondary | ICD-10-CM | POA: Diagnosis not present

## 2017-12-10 DIAGNOSIS — Z7902 Long term (current) use of antithrombotics/antiplatelets: Secondary | ICD-10-CM | POA: Diagnosis not present

## 2017-12-10 DIAGNOSIS — Z79899 Other long term (current) drug therapy: Secondary | ICD-10-CM | POA: Diagnosis not present

## 2017-12-12 ENCOUNTER — Encounter (HOSPITAL_COMMUNITY)
Admission: RE | Admit: 2017-12-12 | Discharge: 2017-12-12 | Disposition: A | Discharge status: Home / Self Care | Payer: Medicare Other | Source: Ambulatory Visit | Attending: Internal Medicine | Admitting: Internal Medicine

## 2017-12-12 VITALS — BP 122/80 | HR 66 | Ht 62.5 in | Wt 165.8 lb

## 2017-12-12 DIAGNOSIS — Z79899 Other long term (current) drug therapy: Secondary | ICD-10-CM | POA: Diagnosis not present

## 2017-12-12 DIAGNOSIS — Z7982 Long term (current) use of aspirin: Secondary | ICD-10-CM | POA: Diagnosis not present

## 2017-12-12 DIAGNOSIS — Z7902 Long term (current) use of antithrombotics/antiplatelets: Secondary | ICD-10-CM | POA: Diagnosis not present

## 2017-12-12 DIAGNOSIS — Z9861 Coronary angioplasty status: Secondary | ICD-10-CM

## 2017-12-12 DIAGNOSIS — Z955 Presence of coronary angioplasty implant and graft: Secondary | ICD-10-CM | POA: Diagnosis not present

## 2017-12-12 NOTE — Progress Notes (Signed)
Discharge Progress Report  Patient Details  Name: Kerry Perez MRN: [id number] Date of Birth: 1950-11-11 Referring Provider:     [place] from 09/20/2017 in [place]  Referring Provider  End, Harrell Gave MD       Number of Visits: 22  Reason for Discharge:  Patient has met program and personal goals. Patient exercising independently at a high level   Smoking History:  Social History   Tobacco Use  Smoking Status Never Smoker  Smokeless Tobacco Never Used    Diagnosis:  Status post coronary artery stent placement  S/P PTCA (percutaneous transluminal coronary angioplasty)  ADL [facility]:   Initial Exercise Prescription: Initial Exercise Prescription - 09/20/17 1000      Date of Initial Exercise RX and Referring Provider   Date  09/20/17    Referring Provider  End, Harrell Gave MD      Recumbant Bike   Level  2    RPM  70    Watts  30    Minutes  10    METs  3.26      NuStep   Level  3    SPM  70    Minutes  10    METs  3      Track   Laps  13    Minutes  10    METs  3.3      Prescription Details   Frequency (times per week)  3x    Duration  Progress to 45 minutes of aerobic exercise without signs/symptoms of physical distress      Intensity   THRR 40-80% of Max Heartrate  61-122    Ratings of Perceived Exertion  11-15    Perceived Dyspnea  0-4      Progression   Progression  Continue progressive overload as per policy without signs/symptoms or physical distress.      Resistance Training   Training Prescription  Yes    Weight  3lbs    Reps  10-15       Discharge Exercise Prescription (Final Exercise Prescription Changes): Exercise Prescription Changes - 12/12/17 0950      Response to Exercise   Blood Pressure (Admit)  122/80    Blood Pressure (Exercise)  162/60    Blood Pressure (Exit)  98/62    Heart Rate (Admit)  66 bpm    Heart Rate (Exercise)  128 bpm    Heart Rate (Exit)   66 bpm    Rating of Perceived Exertion (Exercise)  13    Symptoms  none    Duration  Continue with 30 min of aerobic exercise without signs/symptoms of physical distress.    Intensity  THRR unchanged      Progression   Progression  Continue to progress workloads to maintain intensity without signs/symptoms of physical distress.    Average METs  4.8      Resistance Training   Training Prescription  No Relaxation day, no weights.    Weight  --    Reps  --    Time  --      Interval Training   Interval Training  No      Recumbant Bike   Level  3.5    Minutes  10    METs  4      NuStep   Level  6    SPM  90    Minutes  10    METs  6.2  Track   Laps  19    Minutes  10    METs  4.3      Home Exercise Plan   Plans to continue exercise at  Home (comment)    Frequency  Add 4 additional days to program exercise sessions.    Initial Home Exercises Provided  09/28/17       Functional Capacity: 6 Minute Walk    Row Name 09/20/17 1017 12/05/17 1002       6 Minute Walk   Phase  Initial  Discharge    Distance  1768 feet  2622 feet    Distance % Change  -  46.81 %    Walk Time  6 minutes  6 minutes    # of Rest Breaks  0  0    MPH  3.35  4.97    METS  3.81  5.33    RPE  12  11    Perceived Dyspnea   0  -    VO2 Peak  13.33  18.64    Symptoms  No  No    Resting HR  60 bpm  63 bpm    Resting BP  124/70  110/64    Resting Oxygen Saturation   100 %  -    Exercise Oxygen Saturation  during 6 min walk  99 %  -    Max Ex. HR  107 bpm  97 bpm    Max Ex. BP  140/80  160/72    2 Minute Post BP  120/60  97/63       Psychological, QOL, Others - Outcomes: PHQ 2/9: Depression screen [facility] 2/9 12/12/2017 09/24/2017  Decreased Interest 0 0  Down, Depressed, Hopeless 0 0  PHQ - 2 Score 0 0    Quality of Life: Quality of Life - 12/12/17 1002      Quality of Life Scores   Health/Function Pre  21.4 %    Health/Function Post  25.6 %    Health/Function % Change  19.63 %     Socioeconomic Pre  25.71 %    Socioeconomic Post  24.21 %    Socioeconomic % Change   -5.83 %    Psych/Spiritual Pre  22.67 %    Psych/Spiritual Post  20.93 %    Psych/Spiritual % Change  -7.68 %    Family Pre  28.8 %    Family Post  27.1 %    Family % Change  -5.9 %    GLOBAL Pre  23.67 %    GLOBAL Post  24.57 %    GLOBAL % Change  3.8 %       Personal Goals: Goals established at orientation with interventions provided to work toward goal. Personal Goals and Risk Factors at Admission - 09/20/17 1020      Core Components/Risk Factors/Patient Goals on Admission    Weight Management  Yes;Weight Loss    Intervention  Weight Management: Provide education and appropriate resources to help participant work on and attain dietary goals.;Weight Management: Develop a combined nutrition and exercise program designed to reach desired caloric intake, while maintaining appropriate intake of nutrient and fiber, sodium and fats, and appropriate energy expenditure required for the weight goal.;Weight Management/Obesity: Establish reasonable short term and long term weight goals.    Admit Weight  166 lb 7.2 oz (75.5 kg)    Goal Weight: Short Term  156 lb (70.8 kg)    Goal Weight: Long Term  146 lb (  66.2 kg)    Expected Outcomes  Short Term: Continue to assess and modify interventions until short term weight is achieved;Long Term: Adherence to nutrition and physical activity/exercise program aimed toward attainment of established weight goal;Understanding recommendations for meals to include 15-35% energy as protein, 25-35% energy from fat, 35-60% energy from carbohydrates, less than 232m of dietary cholesterol, 20-35 gm of total fiber daily;Weight Loss: Understanding of general recommendations for a balanced deficit meal plan, which promotes 1-2 lb weight loss per week and includes a negative energy balance of [phone] kcal/d;Understanding of distribution of calorie intake throughout the day with the  consumption of 4-5 meals/snacks    Diabetes  Yes    Intervention  Provide education about signs/symptoms and action to take for hypo/hyperglycemia.;Provide education about proper nutrition, including hydration, and aerobic/resistive exercise prescription along with prescribed medications to achieve blood glucose in normal ranges: Fasting glucose 65-99 mg/dL    Expected Outcomes  Short Term: Participant verbalizes understanding of the signs/symptoms and immediate care of hyper/hypoglycemia, proper foot care and importance of medication, aerobic/resistive exercise and nutrition plan for blood glucose control.;Long Term: Attainment of HbA1C < 7%.    Hypertension  Yes    Intervention  Provide education on lifestyle modifcations including regular physical activity/exercise, weight management, moderate sodium restriction and increased consumption of fresh fruit, vegetables, and low fat dairy, alcohol moderation, and smoking cessation.;Monitor prescription use compliance.    Expected Outcomes  Short Term: Continued assessment and intervention until BP is < 140/999mHG in hypertensive participants. < 130/8095mG in hypertensive participants with diabetes, heart failure or chronic kidney disease.;Long Term: Maintenance of blood pressure at goal levels.    Lipids  Yes    Intervention  Provide education and support for participant on nutrition & aerobic/resistive exercise along with prescribed medications to achieve LDL <51m34mDL >40mg67m Expected Outcomes  Short Term: Participant states understanding of desired cholesterol values and is compliant with medications prescribed. Participant is following exercise prescription and nutrition guidelines.;Long Term: Cholesterol controlled with medications as prescribed, with individualized exercise RX and with personalized nutrition plan. Value goals: LDL < 51mg,13m > 40 mg.        Personal Goals Discharge: Goals and Risk Factor Review    Row Name 10/25/17 1000  11/20/17 1432           Core Components/Risk Factors/Patient Goals Review   Personal Goals Review  Weight Management/Obesity;Lipids;Hypertension;Diabetes  Weight Management/Obesity;Lipids;Hypertension;Diabetes      Review  Cat's resting blood pressures have within normal liniuts at cardiac rehab. Cat has had some exertional blood pressures at cardiac rehab. Cat work's hard!  Cat's resting blood pressures have within normal liniuts at cardiac rehab. Cat has had some exertional blood pressures at cardiac rehab. Cat work's hard! Dr NelsonMeda Coffeeare of Cat's intermittent exertional bp elevations.      Expected Outcomes  Cat will continue to take her medcations as presribed. Cat will continue to follow a heart healthy diabetic diet will continue to monitor BP's  Cat will continue to take her medcations as presribed. Cat will continue to follow a heart healthy diabetic diet will continue to monitor BP's         Exercise Goals and Review: Exercise Goals    Row Name 09/20/17 1013             Exercise Goals   Increase Physical Activity  Yes       Intervention  Provide advice, education, support and counseling about  physical activity/exercise needs.;Develop an individualized exercise prescription for aerobic and resistive training based on initial evaluation findings, risk stratification, comorbidities and participant's personal goals.       Expected Outcomes  Short Term: Attend rehab on a regular basis to increase amount of physical activity.;Long Term: Add in home exercise to make exercise part of routine and to increase amount of physical activity.;Long Term: Exercising regularly at least 3-5 days a week.       Increase Strength and Stamina  Yes       Intervention  Provide advice, education, support and counseling about physical activity/exercise needs.;Develop an individualized exercise prescription for aerobic and resistive training based on initial evaluation findings, risk stratification,  comorbidities and participant's personal goals.       Expected Outcomes  Short Term: Increase workloads from initial exercise prescription for resistance, speed, and METs.;Short Term: Perform resistance training exercises routinely during rehab and add in resistance training at home;Long Term: Improve cardiorespiratory fitness, muscular endurance and strength as measured by increased METs and functional capacity (6MWT)       Able to understand and use rate of perceived exertion (RPE) scale  Yes       Intervention  Provide education and explanation on how to use RPE scale       Expected Outcomes  Short Term: Able to use RPE daily in rehab to express subjective intensity level;Long Term:  Able to use RPE to guide intensity level when exercising independently       Intervention  Provide education and explanation on how to use Dyspnea scale       Expected Outcomes  Short Term: Able to use Dyspnea scale daily in rehab to express subjective sense of shortness of breath during exertion;Long Term: Able to use Dyspnea scale to guide intensity level when exercising independently       Knowledge and understanding of Target Heart Rate Range (THRR)  Yes       Intervention  Provide education and explanation of THRR including how the numbers were predicted and where they are located for reference       Expected Outcomes  Short Term: Able to state/look up THRR;Long Term: Able to use THRR to govern intensity when exercising independently;Short Term: Able to use daily as guideline for intensity in rehab       Able to check pulse independently  Yes       Intervention  Provide education and demonstration on how to check pulse in carotid and radial arteries.;Review the importance of being able to check your own pulse for safety during independent exercise       Expected Outcomes  Short Term: Able to explain why pulse checking is important during independent exercise;Long Term: Able to check pulse independently and accurately        Understanding of Exercise Prescription  Yes       Intervention  Provide education, explanation, and written materials on patient's individual exercise prescription       Expected Outcomes  Short Term: Able to explain program exercise prescription;Long Term: Able to explain home exercise prescription to exercise independently          Nutrition & Weight - Outcomes: Pre Biometrics - 09/20/17 1042      Pre Biometrics   Height  5' 2.5" (1.588 m)    Weight  166 lb 7.2 oz (75.5 kg)    Waist Circumference  36.5 inches    Hip Circumference  43 inches    Waist to  Hip Ratio  0.85 %    BMI (Calculated)  29.94    Triceps Skinfold  45 mm    % Body Fat  43.5 %    Grip Strength  32 kg    Flexibility  14 in    Single Leg Stand  30 seconds      Post Biometrics - 12/12/17 0950       Post  Biometrics   Height  5' 2.5" (1.588 m)    Weight  165 lb 12.6 oz (75.2 kg)    Waist Circumference  34.75 inches    Hip Circumference  42.5 inches    Waist to Hip Ratio  0.82 %    BMI (Calculated)  29.82    Triceps Skinfold  43.5 mm    % Body Fat  42.6 %    Grip Strength  31 kg    Flexibility  15 in    Single Leg Stand  30 seconds       Nutrition: Nutrition Therapy & Goals - 09/21/17 0921      Nutrition Therapy   Diet  Carb Modified, Heart Healthy      Personal Nutrition Goals   Nutrition Goal  Pt to identify food quantities necessary to achieve weight loss of 6-24 lb (2.7-10.9 kg) at graduation from cardiac rehab. Goal wt of 145 lb desired.       Intervention Plan   Intervention  Prescribe, educate and counsel regarding individualized specific dietary modifications aiming towards targeted core components such as weight, hypertension, lipid management, diabetes, heart failure and other comorbidities.    Expected Outcomes  Short Term Goal: Understand basic principles of dietary content, such as calories, fat, sodium, cholesterol and nutrients.;Long Term Goal: Adherence to prescribed nutrition  plan.       Nutrition Discharge: Nutrition Assessments - 12/19/17 1005      MEDFICTS Scores   Pre Score  21    Post Score  21    Score Difference  0       Education Questionnaire Score: Knowledge Questionnaire Score - 12/12/17 1002      Knowledge Questionnaire Score   Pre Score  20/24    Post Score  21/24       Goals reviewed with patient; copy given to patient.Cat graduated from cardiac rehab program today with completion of 34 exercise sessions in Phase II. Pt maintained good attendance and progressed nicely during her participation in rehab as evidenced by increased MET level.   Medication list reconciled. Repeat  PHQ score-0  .  Pt has made significant lifestyle changes and should be commended for her success. Pt feels he has achieved his goals during cardiac rehab.   Pt plans to continue exercise by walking and clogging. Cat plans to exercise at the gym upon her return from visiting [place]. Cat increased her distance post exercise walk test and maintained her weight. We are proud of Cat's progress in the program. Cat stayed motviated and did well.Barnet Pall, RN,BSN 01/08/2018 4:19 PM

## 2017-12-13 ENCOUNTER — Ambulatory Visit (INDEPENDENT_AMBULATORY_CARE_PROVIDER_SITE_OTHER): Payer: Medicare Other | Admitting: Cardiology

## 2017-12-13 ENCOUNTER — Encounter: Payer: Self-pay | Admitting: Cardiology

## 2017-12-13 VITALS — BP 126/58 | HR 59 | Ht 61.0 in | Wt 164.4 lb

## 2017-12-13 DIAGNOSIS — I10 Essential (primary) hypertension: Secondary | ICD-10-CM

## 2017-12-13 DIAGNOSIS — I2510 Atherosclerotic heart disease of native coronary artery without angina pectoris: Secondary | ICD-10-CM

## 2017-12-13 DIAGNOSIS — I350 Nonrheumatic aortic (valve) stenosis: Secondary | ICD-10-CM

## 2017-12-13 DIAGNOSIS — I2583 Coronary atherosclerosis due to lipid rich plaque: Secondary | ICD-10-CM | POA: Diagnosis not present

## 2017-12-13 DIAGNOSIS — E785 Hyperlipidemia, unspecified: Secondary | ICD-10-CM

## 2017-12-13 DIAGNOSIS — E7800 Pure hypercholesterolemia, unspecified: Secondary | ICD-10-CM

## 2017-12-13 DIAGNOSIS — Z9861 Coronary angioplasty status: Secondary | ICD-10-CM | POA: Diagnosis not present

## 2017-12-13 MED ORDER — CLOPIDOGREL BISULFATE 75 MG PO TABS: 75.0000 mg | ORAL_TABLET | Freq: Every day | ORAL | 11 refills | Status: DC

## 2017-12-13 NOTE — Progress Notes (Addendum)
Patient ID: Kerry Perez, female   DOB: 12-16-1950, 67 y.o.   MRN: [id number]    Chief complain: follow up for CAD, aortic stenosis  History of Present Illness: Kerry Perez is a 67 year old female with a history of HTN, HLD, diet controlled DM, OA, GERD, positive family history for CAD who was evaluated in the office for chest pain and dyspnea in 2015. Cardiac Ct showed calcium score of 144 and one vessel CAD in the LAD.  She was then referred for cardiac cath - this showed a moderate LAD lesion with a nonischemic nuclear stress test. Managed medically.  Does have diastolic dysfunction - EF is normal at 60 to 65%. Her statin was increased. She was also noted to have mild AS by echo and normocytic anemia on her labs. The cardiac CT also showed some prominent lymphoid tissue - no constitutional symptoms - asked to follow up with PCP. She remains very active always walking over 10,000 steps a day. Despite that 2 years later her symptoms of his exertional dyspnea and pressure was worsening and she was referred for another coronary CTA. Cardiac CTA in September 2018 showed progression of coronary calcium score to 384. This was 67 percentile for age and sex matched control. Calcium score 144 in 10/2013. Moderate plaque in the proximal to mid LAD and at least moderate plaque in the proximal LCX artery. Additional analysis with CT FFR will be performed. Proximal LAD CT FFR: 0.89. Mid LAD CT FFR: 0.84. Mid to distal LAD CT FFR: 079. The patient underwent cardiac catheterization with placement of stent to mid LAD and bowel angioplasty of diagonal branch.  08/13/17 - Today she states that she continues to walk and dance couple times a week. Walking continues to give her shortness of breath and sometimes chest pressure this is unchanged from before coronary intervention. When she dances she states that her mind is preoccupied and she doesn't have any symptoms. She denies any palpitations dizziness or  syncope.  12/13/17 - 4 months follow up, she just completed 12 weeks of cardiac rehab, and enjoyed it very much, se is concerned about BP up to 170 at peak exercise followed by pos-exertional hypotension - associated with mild dizziness, no falls. No chest pain. Also orthostatic hypotension. Mild bruising.   Allergies  Allergen Reactions  . Lisinopril Other (See Comments)    Lips swelled Lips swelled   Past Medical History:  Diagnosis Date  . Abnormal cardiac CT angiography    a. Ancillary findings: prominent hilar lymphoid tissue nonspecific - possibly related to CHF and lymphatic congestion, clinical correlation for signs/sx of ymphoproliferative disorder or other malignancy is suggested - denied constitutional sx and will f/u with PCP.  Marland Kitchen Anemia   . Aortic stenosis    a. Mild by echo 10/2013.  Marland Kitchen Arthritis    "knees" (05/03/2017)   . CAD (coronary artery disease)    a. Moderate LAD lesion by cath 10/15/13 with nonischemic nuclear stress test 10/17/13  . Diastolic CHF (HCC)    a. Dx 10/2013 - normal EF but elevated LVEDP by cath.  . GERD (gastroesophageal reflux disease)    mild  . Heart failure (HCC)   . Heart murmur   . History of blood transfusion 1965   "related to OR"  . Hypercholesterolemia   . Hypertension   . Migraine    "they've faded out; might have 1/year; if that; not as strong as when I was a kid" (05/03/2017)  . S/P angioplasty  with stent 05/03/17 to mLAD with DES and ostium of D2 with 50% residual stenosis.   05/04/2017  . Type 2 diabetes, diet controlled (HCC)    Past Surgical History:  Procedure Laterality Date  . APPENDECTOMY  1965  . BREAST BIOPSY Right ~ 2000; ~ 2002"   "both benign"  . CARDIAC CATHETERIZATION    . CORONARY ANGIOPLASTY WITH STENT PLACEMENT  05/03/2017  . EXPLORATORY LAPAROTOMY  1965   Hairball removed from stomach  . INTRAVASCULAR PRESSURE WIRE/FFR STUDY N/A 05/03/2017   Procedure: INTRAVASCULAR PRESSURE WIRE/FFR STUDY;  Surgeon: [name], MD;  Location: [facility];  Service: Cardiovascular;  Laterality: N/A;  . KNEE ARTHROSCOPY Left 06/18/2013   Procedure: LEFT KNEE ARTHROSCOPY WITH DEBRIDEMENT;  Surgeon: Loanne Drilling, MD;  Location: [facility];  Service: Orthopedics;  Laterality: Left;  . LEFT HEART CATH AND CORONARY ANGIOGRAPHY N/A 05/03/2017   Procedure: LEFT HEART CATH AND CORONARY ANGIOGRAPHY;  Surgeon: [name], MD;  Location: [facility];  Service: Cardiovascular;  Laterality: N/A;  . LEFT HEART CATHETERIZATION WITH CORONARY ANGIOGRAM N/A 10/15/2013   Procedure: LEFT HEART CATHETERIZATION WITH CORONARY ANGIOGRAM;  Surgeon: Iran Ouch, MD;  Location: [facility];  Service: Cardiovascular;  Laterality: N/A;    Social History   Tobacco Use  Smoking Status Never Smoker  Smokeless Tobacco Never Used    Social History   Substance and Sexual Activity  Alcohol Use Yes  . Alcohol/week: 2.4 oz  . Types: 4 Glasses of wine per week   Family History  Problem Relation Age of Onset  . Hypertension Mother   . Heart disease Mother   . Osteoporosis Sister    Review of Systems: The review of systems is per the HPI.  All other systems were reviewed and are negative.  Physical Exam: BP (!) 126/58   Pulse (!) 59   Ht  (1.549 m)   Wt 164 lb 6.4 oz (74.6 kg)   LMP 08/07/1998   SpO2 98%   BMI 31.06 kg/m  Patient is very pleasant and in no acute distress. [place] accent. She is obese. Skin is warm and dry. Color is normal.  HEENT is unremarkable. Normocephalic/atraumatic. PERRL. Sclera are nonicteric. Neck is supple. No masses. No JVD. Lungs are clear. Cardiac exam shows a regular rate and rhythm. She has a 3/6 systolic murmur,  Abdomen is soft. Extremities are without edema. Gait and ROM are intact. No gross neurologic deficits noted.  Wt Readings from Last 3 Encounters:  12/13/17 164 lb 6.4 oz (74.6 kg)  09/20/17 166 lb 7.2 oz (75.5 kg)  08/20/17 165 lb (74.8 kg)   LABORATORY  DATA: EKG with sinus brady - unchanged. Reviewed with Dr. Delton See  Lab Results  Component Value Date   WBC 5.4 05/04/2017   HGB 11.0 (L) 05/04/2017   HCT 33.8 (L) 05/04/2017   PLT 185 05/04/2017   GLUCOSE 93 05/04/2017   CHOL 156 08/13/2015   TRIG 56 08/13/2015   HDL 87 08/13/2015   LDLCALC 58 08/13/2015   ALT 16 08/13/2015   AST 24 08/13/2015   NA 137 05/04/2017   K 3.6 05/04/2017   CL 102 05/04/2017   CREATININE 0.76 05/04/2017   BUN 16 05/04/2017   CO2 27 05/04/2017   TSH 1.734 08/13/2015   INR 1.0 04/30/2017   Echo Study Conclusions  - Left ventricle: The cavity size was normal. Wall thickness was increased in a pattern of mild LVH. Systolic function  was normal. The estimated ejection fraction was in the range of 60% to 65%. Wall motion was normal; there were no regional wall motion abnormalities. Left ventricular diastolic function parameters were normal. - Aortic valve: There was very mild stenosis. Mild regurgitation. Valve area: 1.73cm^2(VTI). Valve area: 1.67cm^2 (Vmax). - Right atrium: The atrium was mildly dilated.  Coronary angiography:  Coronary dominance: right  Left Main: normal  Left Anterior Descending (LAD): Normal in size and mildly calcified. There is a 50-60% discrete stenosis after the origin of second diagonal. The rest of the midsegment has minor irregularities. There is mild myocardial bridging noted in the midsegment.  1st diagonal (D1): Normal in size with 30% ostial stenosis.  2nd diagonal (D2): Normal in size with 20% ostial stenosis.  3rd diagonal (D3): Small in size with minor irregularities.  Circumflex (LCx): Normal in size and nondominant. The vessel has minor irregularities.  1st obtuse marginal: Very small in size.  2nd obtuse marginal: Normal in size with no significant disease.  3rd obtuse marginal: Normal in size with minor irregularities.  Right Coronary Artery: normal in size and dominant. The stent percent proximal stenosis and  20% mid stenosis.  Posterior descending artery: normal in size with no significant disease.  Posterior AV segment: normal in size with no significant disease.  Posterolateral branchs: No significant disease. Left ventriculography: Left ventricular systolic function is normal , LVEF is estimated at 60 %, there is no significant mitral regurgitation  Final Conclusions:  1. Moderate mid LAD stenosis with mild mid myocardial bridging.  2.Normal LV systolic function with mildly elevated left ventricular end-diastolic pressure.  Recommendations:  I requested an echocardiogram. Recommend initial medical therapy. If the patient fails medical therapy, consider functional testing or pressure wire interrogation of the LAD  Kerry Bears MD, [facility] Perez  10/15/2013, 7:47 PM  Chest CT 10/2013 IMPRESSION:  No evidence for mediastinal or hilar lymphadenopathy.  Probable bilateral tiny calcified granulomata. As the 2 mm nodule in  the left lower lobe cannot be definitely seen to be calcified,  followup by consensus criteria is suggested. If the patient is at  high risk for bronchogenic carcinoma, follow-up chest CT at 1 year  is recommended. If the patient is at low risk, no follow-up is  needed. This recommendation follows the consensus statement:  Guidelines for Management of Small Pulmonary Nodules Detected on CT  Scans: A Statement from the Fleischner Society as published in  Radiology 2005; 237:395-400.  Electronically Signed  By: Kerry Perez M.D.  On: 11/03/2013 12:06  CARDIAC CT IMPRESSION: 1. The appearance of the lungs suggests mild interstitial pulmonary edema. Given the presence of bilateral pleural effusions, findings suggest congestive heart failure. 2. Prominent lymphoid tissue in the hilar regions bilaterally is nonspecific. In the setting of potential congestive failure, this may be related to edema and lymphatic congestion, however, clinical correlation for signs and symptoms of the  lymphoproliferative disorder or other malignancy is suggested.  Electronically Signed By: Kerry Perez M.D. On: 10/16/2013 18:18  IMPRESSION: 1. Coronary calcium score of 144. This was 79 percentile for age and sex matched control.  2. Normal origin or coronary arteries, right dominance.  3. One vessel coronary artery disease with significant lesions in the mid and possibly proximal LAD. Cardiac catheterization is recommended.  Kerry Perez   TTE: 06/28/2016 Impression:  1. Left ventricular systolic function is normal.  2. Visually estimated ejection fraction is 70%.   3. The left ventricular cavity size is normal by M-Mode.  4. Mild  aortic regurgitation.   5. Aortic valve is sclerotic.   6. There were no stress-induced wall motion abnormalities. This is a  negative stress echo test for ischemia at 95 % PMHR.   7. Mild degree of aortic stenosis is present with peak gradient of 27.6   mmHg, mean gradient of 17.0 mmHg and a calculated valve area of 1.95 cm?.  8. Mild tricuspid regurgitation.  9. With an assumed right atrial pressure of 3 mmHg, the estimated right   ventricular systolic pressure is mildly elevated at 37.1 mmHg.   10. The left atrium is mildly dilated by M-Mode.    EKG performed today 08/13/2017, shows normal sinus rhythm 64 bpm normal EKG unchanged from prior.    Assessment / Plan:  1. CAD - status post PCI to LAD and balloon angioplasty to diagonal branch in October 2018. Completed cardiac rehab with improvement of symptoms, encouraged to continue exercising. Encourage to continue ASA/Plavix  for a minimum 1 year post stenting, she is ok with mild bruising. Continue Toprol-XL, high dose Lipitor and Zetia.   2. Moderate aortic stenosis and mild aortic regurgitation on echocardiogram in September 2018, mean transaortic gradient 27 mmHg. S2 present, we will repeat echo in 04/2018 to re-evaluate.  3. Chronic diastolic HF - compensated. Continue with current regimen. Lasix when necessary she hasn't used it in years.  4. Abnormal cardiac CT - lymphoid tissue or coronary CT however a followup CT chest showed only 2 mm lung nodule. No lymphadenopathy inhaler or mediastinal region. No mass in the neck area. Repeat chest CT showed stable lesion and doesn't need to be repeated for this indication.   5. Hyperlipidemia, because of worsening and progression of her coronary artery disease her atorvastatin was increased to 80 mg daily and Zetia was added to her regimen. Her lipids are at goal.  6. Hypertension, well controlled.  Follow up in 4 months.  Kerry Perez 12/13/2017

## 2017-12-13 NOTE — Patient Instructions (Signed)
Medication Instructions:   Your physician recommends that you continue on your current medications as directed. Please refer to the Current Medication list given to you today.     Testing/Procedures:  Your physician has requested that you have an echocardiogram. Echocardiography is a painless test that uses sound waves to create images of your heart. It provides your doctor with information about the size and shape of your heart and how well your heart's chambers and valves are working. This procedure takes approximately one hour. There are no restrictions for this procedure.  ECHO SHOULD BE SCHEDULED FOR September 2019     Follow-Up:  4 MONTHS WITH DR Delton See       If you need a refill on your cardiac medications before your next appointment, please call your pharmacy.

## 2017-12-14 ENCOUNTER — Encounter (HOSPITAL_COMMUNITY): Payer: Medicare Other

## 2017-12-17 ENCOUNTER — Encounter (HOSPITAL_COMMUNITY): Payer: Medicare Other

## 2017-12-19 ENCOUNTER — Encounter (HOSPITAL_COMMUNITY): Payer: Medicare Other

## 2017-12-21 ENCOUNTER — Encounter (HOSPITAL_COMMUNITY): Payer: Medicare Other

## 2017-12-24 ENCOUNTER — Encounter (HOSPITAL_COMMUNITY): Payer: Medicare Other

## 2017-12-26 ENCOUNTER — Encounter (HOSPITAL_COMMUNITY): Payer: Medicare Other

## 2018-01-06 ENCOUNTER — Other Ambulatory Visit: Payer: Self-pay | Admitting: Cardiology

## 2018-01-08 NOTE — Addendum Note (Signed)
Encounter addended by: Cammy CopaWhitaker, Maria W, RN on: 01/08/2018 4:31 PM  Actions taken: Flowsheet data copied forward, Visit Navigator Flowsheet section accepted, Sign clinical note

## 2018-01-14 DIAGNOSIS — M810 Age-related osteoporosis without current pathological fracture: Secondary | ICD-10-CM | POA: Diagnosis not present

## 2018-01-27 ENCOUNTER — Encounter: Payer: Self-pay | Admitting: Cardiology

## 2018-01-29 ENCOUNTER — Telehealth: Payer: Self-pay | Admitting: Cardiology

## 2018-01-29 NOTE — Telephone Encounter (Signed)
New Message    Patient is calling to see if she can take some additional type of medication for her knee pain. Such as CBD oil. Please call.

## 2018-01-29 NOTE — Telephone Encounter (Signed)
Called the pt back and informed her that per our Pharmacist Prudence DavidsonKelley Auten , CBD is a moderate inhibitor of the enzyme that activates clopidogrel, thus if she is taking it orally her clopidogrel could be less effective.  Informed the pt that given that she is less than a year out from her stent placement I would recommend against using CBD oil orally.  Did inform the pt that per Nicholaus BloomKelley, If she plans to apply topically (based on the message it appears she plans to take orally, but in case) to the area it would be unlikely that systemic levels would influence clopidogrel. Per the pt, she states she will hold off on the use of CBD Oil all together, given this information provided. Pt states she is getting in to see her Ortho MD, and will be discussing safe pain management, with her heart history. Pt verbalized understanding and agrees with this plan.  Pt more than gracious for all the assistance provided.

## 2018-01-29 NOTE — Telephone Encounter (Signed)
Pt is calling to ask Dr Delton SeeNelson if using CBD Oil is contraindicated with her cardiac meds and history.  Pt states she was considering using CBD Oil for arthritis.  Informed the pt that Dr Delton SeeNelson is out of the office this week, but I will route this message to  both her and our Pharmacist, for further recommendations.  Informed the pt that I will follow-up with her thereafter, once advise provided.  Pt verbalized understanding and agrees with this plan.

## 2018-01-29 NOTE — Telephone Encounter (Signed)
CBD is a moderate inhibitor of the enzyme that activates clopidogrel. Thus if she is taking it orally her clopidogrel could be less effective. Given that she is less than a year out from her stent placement I would recommend against using CBD oil orally. If she plans to apply topically (based on the message it appears she plans to take orally, but in case) to the area it would be unlikely that systemic levels would influence clopidogrel.

## 2018-01-30 NOTE — Telephone Encounter (Signed)
Me       01/29/18 3:28 PM  Note    Called the pt back and informed her that per our Pharmacist Prudence DavidsonKelley Auten , CBD is a moderate inhibitor of the enzyme that activates clopidogrel, thus if she is taking it orally her clopidogrel could be less effective.  Informed the pt that given that she is less than a year outfromher stent placementI would recommend against using CBD oil orally.  Did inform the pt that per Nicholaus BloomKelley, If she plans to apply topically(based on the message it appears she plans to take orally, but in case)to the area it would be unlikely that systemic levels would influence clopidogrel. Per the pt, she states she will hold off on the use of CBD Oil all together, given this information provided. Pt states she is getting in to see her Ortho MD, and will be discussing safe pain management, with her heart history. Pt verbalized understanding and agrees with this plan.  Pt more than gracious for all the assistance provided.              01/29/18 2:20 PM  Levin BaconAuten, Kelley M, RPH routed this conversation to Lars MassonNelson, Katarina H, MD . Me    Levin Baconuten, Kelley M, [facility] Hospital[facility]PH       01/29/18 2:11 PM  Note    CBD is a moderate inhibitor of the enzyme that activates clopidogrel. Thus if she is taking it orally her clopidogrel could be less effective. Given that she is less than a year out from her stent placement I would recommend against using CBD oil orally. If she plans to apply topically (based on the message it appears she plans to take orally, but in case) to the area it would be unlikely that systemic levels would influence clopidogrel.       Me  to Lars MassonNelson, Katarina H, MD . Levin BaconAuten, Kelley M, [facility] CenterRPH . Awilda MetroSupple, Megan E, [facility]PH        01/29/18 1:23 PM  Any insight with CBD oil and taking with her heart meds and hx?  Thanks!    Me       01/29/18 1:16 PM  Note    Pt is calling to ask Dr Delton SeeNelson if using CBD Oil is contraindicated with her cardiac meds and history.  Pt states she was  considering using CBD Oil for arthritis.  Informed the pt that Dr Delton SeeNelson is out of the office this week, but I will route this message to  both her and our Pharmacist, for further recommendations.  Informed the pt that I will follow-up with her thereafter, once advise provided.  Pt verbalized understanding and agrees with this plan.

## 2018-01-31 NOTE — Telephone Encounter (Signed)
I would be ok with topical use

## 2018-02-01 ENCOUNTER — Encounter: Payer: Self-pay | Admitting: *Deleted

## 2018-02-01 NOTE — Telephone Encounter (Signed)
Sent the pt a message via her mychart account endorsing to her that Dr Delton SeeNelson said she could try the topical CBD, if needed.  Endorsed to the pt that we are not quite sure where you would get this from, but it does exist.  Advised the pt to call back if she has any further questions or concerns regarding this issue.

## 2018-02-22 DIAGNOSIS — M170 Bilateral primary osteoarthritis of knee: Secondary | ICD-10-CM | POA: Diagnosis not present

## 2018-02-27 DIAGNOSIS — M2041 Other hammer toe(s) (acquired), right foot: Secondary | ICD-10-CM | POA: Diagnosis not present

## 2018-02-27 DIAGNOSIS — M2042 Other hammer toe(s) (acquired), left foot: Secondary | ICD-10-CM | POA: Diagnosis not present

## 2018-02-27 DIAGNOSIS — M79672 Pain in left foot: Secondary | ICD-10-CM | POA: Diagnosis not present

## 2018-02-27 DIAGNOSIS — M79671 Pain in right foot: Secondary | ICD-10-CM | POA: Diagnosis not present

## 2018-03-11 DIAGNOSIS — I2510 Atherosclerotic heart disease of native coronary artery without angina pectoris: Secondary | ICD-10-CM | POA: Diagnosis not present

## 2018-03-11 DIAGNOSIS — I5032 Chronic diastolic (congestive) heart failure: Secondary | ICD-10-CM | POA: Diagnosis not present

## 2018-03-11 DIAGNOSIS — I700 Atherosclerosis of aorta: Secondary | ICD-10-CM | POA: Diagnosis not present

## 2018-03-11 DIAGNOSIS — E7800 Pure hypercholesterolemia, unspecified: Secondary | ICD-10-CM | POA: Diagnosis not present

## 2018-03-11 DIAGNOSIS — I10 Essential (primary) hypertension: Secondary | ICD-10-CM | POA: Diagnosis not present

## 2018-03-11 DIAGNOSIS — E119 Type 2 diabetes mellitus without complications: Secondary | ICD-10-CM | POA: Diagnosis not present

## 2018-03-11 DIAGNOSIS — M810 Age-related osteoporosis without current pathological fracture: Secondary | ICD-10-CM | POA: Diagnosis not present

## 2018-03-11 DIAGNOSIS — I350 Nonrheumatic aortic (valve) stenosis: Secondary | ICD-10-CM | POA: Diagnosis not present

## 2018-04-03 DIAGNOSIS — Z23 Encounter for immunization: Secondary | ICD-10-CM | POA: Diagnosis not present

## 2018-04-16 DIAGNOSIS — S60561A Insect bite (nonvenomous) of right hand, initial encounter: Secondary | ICD-10-CM | POA: Diagnosis not present

## 2018-04-16 DIAGNOSIS — E871 Hypo-osmolality and hyponatremia: Secondary | ICD-10-CM | POA: Diagnosis not present

## 2018-05-15 ENCOUNTER — Ambulatory Visit (HOSPITAL_COMMUNITY): Payer: Medicare Other | Attending: Cardiovascular Disease

## 2018-05-15 ENCOUNTER — Other Ambulatory Visit: Payer: Self-pay

## 2018-05-15 DIAGNOSIS — I350 Nonrheumatic aortic (valve) stenosis: Secondary | ICD-10-CM | POA: Diagnosis not present

## 2018-06-06 ENCOUNTER — Encounter

## 2018-06-06 ENCOUNTER — Ambulatory Visit (INDEPENDENT_AMBULATORY_CARE_PROVIDER_SITE_OTHER): Payer: Medicare Other | Admitting: Cardiology

## 2018-06-06 VITALS — BP 130/66 | HR 59 | Ht 62.0 in | Wt 162.0 lb

## 2018-06-06 DIAGNOSIS — I2510 Atherosclerotic heart disease of native coronary artery without angina pectoris: Secondary | ICD-10-CM

## 2018-06-06 DIAGNOSIS — I10 Essential (primary) hypertension: Secondary | ICD-10-CM

## 2018-06-06 DIAGNOSIS — Z9861 Coronary angioplasty status: Secondary | ICD-10-CM

## 2018-06-06 DIAGNOSIS — I350 Nonrheumatic aortic (valve) stenosis: Secondary | ICD-10-CM

## 2018-06-06 DIAGNOSIS — E785 Hyperlipidemia, unspecified: Secondary | ICD-10-CM

## 2018-06-06 MED ORDER — LOSARTAN POTASSIUM 100 MG PO TABS: 100.0000 mg | ORAL_TABLET | Freq: Every day | ORAL | 3 refills | Status: DC

## 2018-06-06 MED ORDER — HYDROCHLOROTHIAZIDE 25 MG PO TABS: 25.0000 mg | ORAL_TABLET | Freq: Every day | ORAL | 3 refills | Status: DC

## 2018-06-06 NOTE — Patient Instructions (Signed)
Medication Instructions:  1) DISCONTINUE Hyzaar 2) START Losartan 100mg  once daily.  We have placed the HCTZ on hold for now.    If you need a refill on your cardiac medications before your next appointment, please call your pharmacy.   Lab work: None If you have labs (blood work) drawn today and your tests are completely normal, you will receive your results only by: Marland Kitchen MyChart Message (if you have MyChart) OR . A paper copy in the mail If you have any lab test that is abnormal or we need to change your treatment, we will call you to review the results.  Testing/Procedures: None  Follow-Up: At [facility], you and your health needs are our priority.  As part of our continuing mission to provide you with exceptional heart care, we have created designated Provider Care Teams.  These Care Teams include your primary Cardiologist (physician) and Advanced Practice Providers (APPs -  Physician Assistants and Nurse Practitioners) who all work together to provide you with the care you need, when you need it. You will need a follow up appointment in 6 months.  Please call our office 2 months in advance to schedule this appointment.  You may see Tobias Alexander, MD or one of the following Advanced Practice Providers on your designated Care Team:   [place], PA-C Ronie Spies, PA-C . Jacolyn Reedy, PA-C  Any Other Special Instructions Will Be Listed Below (If Applicable).

## 2018-06-06 NOTE — Progress Notes (Signed)
Patient ID: Kerry Perez, female   DOB: [birth year], 67 y.o.   MRN: [id number]    Chief complain: follow up for CAD, aortic stenosis  History of Present Illness: Kerry Perez is a 67 year old female with a history of HTN, HLD, diet controlled DM, OA, GERD, positive family history for CAD who was evaluated in the office for chest pain and dyspnea in 2015. Cardiac Ct showed calcium score of 144 and one vessel CAD in the LAD.  She was then referred for cardiac cath - this showed a moderate LAD lesion with a nonischemic nuclear stress test. Managed medically.  Does have diastolic dysfunction - EF is normal at 60 to 65%. Her statin was increased. She was also noted to have mild AS by echo and normocytic anemia on her labs. The cardiac CT also showed some prominent lymphoid tissue - no constitutional symptoms - asked to follow up with PCP. She remains very active always walking over 10,000 steps a day. Despite that 2 years later her symptoms of his exertional dyspnea and pressure was worsening and she was referred for another coronary CTA. Cardiac CTA in September 2018 showed progression of coronary calcium score to 384. This was 87 percentile for age and sex matched control. Calcium score 144 in 10/2013. Moderate plaque in the proximal to mid LAD and at least moderate plaque in the proximal LCX artery. Additional analysis with CT FFR will be performed. Proximal LAD CT FFR: 0.89. Mid LAD CT FFR: 0.84. Mid to distal LAD CT FFR: 079. The patient underwent cardiac catheterization with placement of stent to mid LAD and bowel angioplasty of diagonal branch.  08/13/17 - Today she states that she continues to walk and dance couple times a week. Walking continues to give her shortness of breath and sometimes chest pressure this is unchanged from before coronary intervention. When she dances she states that her mind is preoccupied and she doesn't have any symptoms. She denies any palpitations dizziness or  syncope.  12/13/17 - 4 months follow up, she just completed 12 weeks of cardiac rehab, and enjoyed it very much, se is concerned about BP up to 170 at peak exercise followed by pos-exertional hypotension - associated with mild dizziness, no falls. No chest pain. Also orthostatic hypotension. Mild bruising.  06/06/2018 -6 months follow-up, the patient is feeling and looking great, she has lost more weight, she has been placed on medication for osteoporosis, she denies any chest pain shortness of breath dizziness or syncope.  She has no lower extremity edema.  Her blood pressure has been controlled.   Allergies  Allergen Reactions  . Lisinopril Other (See Comments)    Lips swelled Lips swelled   Past Medical History:  Diagnosis Date  . Abnormal cardiac CT angiography    a. Ancillary findings: prominent hilar lymphoid tissue nonspecific - possibly related to CHF and lymphatic congestion, clinical correlation for signs/sx of ymphoproliferative disorder or other malignancy is suggested - denied constitutional sx and will f/u with PCP.  Marland Kitchen Anemia   . Aortic stenosis    a. Mild by echo 10/2013.  Marland Kitchen Arthritis    "knees" (05/03/2017)   . CAD (coronary artery disease)    a. Moderate LAD lesion by cath 10/15/13 with nonischemic nuclear stress test 10/17/13  . Diastolic CHF (HCC)    a. Dx 10/2013 - normal EF but elevated LVEDP by cath.  . GERD (gastroesophageal reflux disease)    mild  . Heart failure (HCC)   . Heart  murmur   . History of blood transfusion 1965   "related to OR"  . Hypercholesterolemia   . Hypertension   . Migraine    "they've faded out; might have 1/year; if that; not as strong as when I was a kid" (05/03/2017)  . S/P angioplasty with stent 05/03/17 to mLAD with DES and ostium of D2 with 50% residual stenosis.   05/04/2017  . Type 2 diabetes, diet controlled (HCC)    Past Surgical History:  Procedure Laterality Date  . APPENDECTOMY  1965  . BREAST BIOPSY Right ~ 2000; ~ 2002"    "both benign"  . CARDIAC CATHETERIZATION    . CORONARY ANGIOPLASTY WITH STENT PLACEMENT  05/03/2017  . EXPLORATORY LAPAROTOMY  1965   Hairball removed from stomach  . INTRAVASCULAR PRESSURE WIRE/FFR STUDY N/A 05/03/2017   Procedure: INTRAVASCULAR PRESSURE WIRE/FFR STUDY;  Surgeon: [name], MD;  Location: [facility];  Service: Cardiovascular;  Laterality: N/A;  . KNEE ARTHROSCOPY Left 06/18/2013   Procedure: LEFT KNEE ARTHROSCOPY WITH DEBRIDEMENT;  Surgeon: Loanne Drilling, MD;  Location: [facility];  Service: Orthopedics;  Laterality: Left;  . LEFT HEART CATH AND CORONARY ANGIOGRAPHY N/A 05/03/2017   Procedure: LEFT HEART CATH AND CORONARY ANGIOGRAPHY;  Surgeon: [name], MD;  Location: [facility];  Service: Cardiovascular;  Laterality: N/A;  . LEFT HEART CATHETERIZATION WITH CORONARY ANGIOGRAM N/A 10/15/2013   Procedure: LEFT HEART CATHETERIZATION WITH CORONARY ANGIOGRAM;  Surgeon: Iran Ouch, MD;  Location: [facility];  Service: Cardiovascular;  Laterality: N/A;    Social History   Tobacco Use  Smoking Status Never Smoker  Smokeless Tobacco Never Used    Social History   Substance and Sexual Activity  Alcohol Use Yes  . Alcohol/week: 4.0 standard drinks  . Types: 4 Glasses of wine per week   Family History  Problem Relation Age of Onset  . Hypertension Mother   . Heart disease Mother   . Osteoporosis Sister    Review of Systems: The review of systems is per the HPI.  All other systems were reviewed and are negative.  Physical Exam: BP 130/66   Pulse (!) 59   Ht 5\' 2"  (1.575 m)   Wt 162 lb (73.5 kg)   LMP 08/07/1998   BMI 29.63 kg/m  Patient is very pleasant and in no acute distress. [place] accent. She is obese. Skin is warm and dry. Color is normal.  HEENT is unremarkable. Normocephalic/atraumatic. PERRL. Sclera are nonicteric. Neck is supple. No masses. No JVD. Lungs are clear. Cardiac exam shows a regular rate and rhythm. She has a  3/6 systolic murmur,  Abdomen is soft. Extremities are without edema. Gait and ROM are intact. No gross neurologic deficits noted.  Wt Readings from Last 3 Encounters:  06/06/18 162 lb (73.5 kg)  12/13/17 164 lb 6.4 oz (74.6 kg)  12/12/17 165 lb 12.6 oz (75.2 kg)   LABORATORY DATA: EKG with sinus brady - unchanged. Reviewed with Dr. Delton See  Lab Results  Component Value Date   WBC 5.4 05/04/2017   HGB 11.0 (L) 05/04/2017   HCT 33.8 (L) 05/04/2017   PLT 185 05/04/2017   GLUCOSE 93 05/04/2017   CHOL 156 08/13/2015   TRIG 56 08/13/2015   HDL 87 08/13/2015   LDLCALC 58 08/13/2015   ALT 16 08/13/2015   AST 24 08/13/2015   NA 137 05/04/2017   K 3.6 05/04/2017   CL 102 05/04/2017   CREATININE 0.76 05/04/2017  BUN 16 05/04/2017   CO2 27 05/04/2017   TSH 1.734 08/13/2015   INR 1.0 04/30/2017   Echo Study Conclusions  - Left ventricle: The cavity size was normal. Wall thickness was increased in a pattern of mild LVH. Systolic function was normal. The estimated ejection fraction was in the range of 60% to 65%. Wall motion was normal; there were no regional wall motion abnormalities. Left ventricular diastolic function parameters were normal. - Aortic valve: There was very mild stenosis. Mild regurgitation. Valve area: 1.73cm^2(VTI). Valve area: 1.67cm^2 (Vmax). - Right atrium: The atrium was mildly dilated.  Coronary angiography:  Coronary dominance: right  Left Main: normal  Left Anterior Descending (LAD): Normal in size and mildly calcified. There is a 50-60% discrete stenosis after the origin of second diagonal. The rest of the midsegment has minor irregularities. There is mild myocardial bridging noted in the midsegment.  1st diagonal (D1): Normal in size with 30% ostial stenosis.  2nd diagonal (D2): Normal in size with 20% ostial stenosis.  3rd diagonal (D3): Small in size with minor irregularities.  Circumflex (LCx): Normal in size and nondominant. The vessel has minor  irregularities.  1st obtuse marginal: Very small in size.  2nd obtuse marginal: Normal in size with no significant disease.  3rd obtuse marginal: Normal in size with minor irregularities.  Right Coronary Artery: normal in size and dominant. The stent percent proximal stenosis and 20% mid stenosis.  Posterior descending artery: normal in size with no significant disease.  Posterior AV segment: normal in size with no significant disease.  Posterolateral branchs: No significant disease. Left ventriculography: Left ventricular systolic function is normal , LVEF is estimated at 60 %, there is no significant mitral regurgitation  Final Conclusions:  1. Moderate mid LAD stenosis with mild mid myocardial bridging.  2.Normal LV systolic function with mildly elevated left ventricular end-diastolic pressure.  Recommendations:  I requested an echocardiogram. Recommend initial medical therapy. If the patient fails medical therapy, consider functional testing or pressure wire interrogation of the LAD  Lorine Bears MD, [facility]  10/15/2013, 7:47 PM  Chest CT 10/2013 IMPRESSION:  No evidence for mediastinal or hilar lymphadenopathy.  Probable bilateral tiny calcified granulomata. As the 2 mm nodule in  the left lower lobe cannot be definitely seen to be calcified,  followup by consensus criteria is suggested. If the patient is at  high risk for bronchogenic carcinoma, follow-up chest CT at 1 year  is recommended. If the patient is at low risk, no follow-up is  needed. This recommendation follows the consensus statement:  Guidelines for Management of Small Pulmonary Nodules Detected on CT  Scans: A Statement from the Fleischner Society as published in  Radiology 2005; 237:395-400.  Electronically Signed  By: Kennith Center M.D.  On: 11/03/2013 12:06  CARDIAC CT IMPRESSION: 1. The appearance of the lungs suggests mild interstitial pulmonary edema. Given the presence of bilateral pleural effusions,  findings suggest congestive heart failure. 2. Prominent lymphoid tissue in the hilar regions bilaterally is nonspecific. In the setting of potential congestive failure, this may be related to edema and lymphatic congestion, however, clinical correlation for signs and symptoms of the lymphoproliferative disorder or other malignancy is suggested.  Electronically Signed By: Trudie Reed M.D. On: 10/16/2013 18:18  IMPRESSION: 1. Coronary calcium score of 144. This was 36 percentile for age and sex matched control.  2. Normal origin or coronary arteries, right dominance.  3. One vessel coronary artery disease with significant lesions in the mid and possibly proximal  LAD. Cardiac catheterization is recommended.  Tobias Alexander   TTE: 06/28/2016 Impression:  1. Left ventricular systolic function is normal.  2. Visually estimated ejection fraction is 70%.   3. The left ventricular cavity size is normal by M-Mode.  4. Mild aortic regurgitation.   5. Aortic valve is sclerotic.   6. There were no stress-induced wall motion abnormalities. This is a  negative stress echo test for ischemia at 95 % PMHR.   7. Mild degree of aortic stenosis is present with peak gradient of 27.6   mmHg, mean gradient of 17.0 mmHg and a calculated valve area of 1.95 cm?.  8. Mild tricuspid regurgitation.  9. With an assumed right atrial pressure of 3 mmHg, the estimated right   ventricular systolic pressure is mildly elevated at 37.1 mmHg.   10. The left atrium is mildly dilated by M-Mode.    EKG performed today 08/13/2017, shows normal sinus rhythm 64 bpm  normal EKG unchanged from prior.    Assessment / Plan:  1. CAD - status post PCI to LAD and balloon angioplasty to diagonal branch in October 2018. Completed cardiac rehab with improvement of symptoms, she continues to exercise, she is compliant with medication and she is tolerating them well, will continue Plavix for now despite some mild bruising.  Her repeated echocardiogram on May 25, 2018 showed normal LVEF 60 to 65% with no regional wall motion abnormalities.  2.  Mild aortic stenosis and mild aortic regurgitation on stable on the echocardiogram in October 2019.  3. Chronic diastolic HF - compensated.  She rarely takes Lasix, I will place hydrochlorothiazide on hold as she has hyponatremia.Marland Kitchen  4. Abnormal cardiac CT - lymphoid tissue or coronary CT however a followup CT chest showed only 2 mm lung nodule. No lymphadenopathy inhaler or mediastinal region. No mass in the neck area. Repeat chest CT showed stable lesion and doesn't need to be repeated for this indication.   5. Hyperlipidemia, because of worsening and progression of her coronary artery disease her atorvastatin was increased to 80 mg daily and Zetia was added to her regimen. Her lipids are at goal.  She is tolerating it well.  6. Hypertension, well controlled.  Follow up in 6 months.  Tobias Alexander 06/06/2018

## 2018-08-20 ENCOUNTER — Telehealth: Payer: Self-pay

## 2018-08-20 NOTE — Telephone Encounter (Signed)
Left message to call Kaitlyn at [phone].  Patient has an appointment with Dr.Jertson on 08/21/2018. This needs to be rescheduled as Dr.Jertson is out of the office. 

## 2018-08-21 ENCOUNTER — Ambulatory Visit: Payer: Medicare Other | Admitting: Obstetrics and Gynecology

## 2018-08-22 ENCOUNTER — Other Ambulatory Visit: Payer: Self-pay | Admitting: Cardiology

## 2018-08-22 DIAGNOSIS — I10 Essential (primary) hypertension: Secondary | ICD-10-CM

## 2018-08-22 DIAGNOSIS — I5031 Acute diastolic (congestive) heart failure: Secondary | ICD-10-CM

## 2018-08-22 DIAGNOSIS — E7800 Pure hypercholesterolemia, unspecified: Secondary | ICD-10-CM

## 2018-09-05 NOTE — Progress Notes (Signed)
68 y.o. [id number] Married White or Caucasian Not Hispanic or Latino female here for annual exam.   No vaginal bleeding. Not sexually active, okay with it. No bowel or bladder c/o.   She is having trouble with her knees. Affecting her ability to exercise. She is going to need a knee replacement on the left, not on right yet.   She started on fosamax for osteoporosis.     Patient's last menstrual period was 08/07/1998.          Sexually active: No.  The current method of family planning is post menopausal status.    Exercising: Yes.    walking, clogging Smoker:  no  Health Maintenance: Pap:  08-17-16 WNL,  04-28-13 WNL NEG HR HPV  History of abnormal Pap:  no MMG:  09-27-16 Birads 1 negative, will schedule Colonoscopy:  2010 normal per patient  BMD:  2019 with PCP- osteoporosis per patient TDaP:  08/08/2007 Gardasil: N/A   reports that she has never smoked. She has never used smokeless tobacco. She reports current alcohol use of about 4.0 standard drinks of alcohol per week. She reports that she does not use drugs.  Both her sons live in [place], [place], [place]. 2 grand children in [place].  Daughter is having a baby, due in 7/20. They are going to move to [place] to help with the baby.   Past Medical History:  Diagnosis Date  . Abnormal cardiac CT angiography    a. Ancillary findings: prominent hilar lymphoid tissue nonspecific - possibly related to CHF and lymphatic congestion, clinical correlation for signs/sx of ymphoproliferative disorder or other malignancy is suggested - denied constitutional sx and will f/u with PCP.  Marland Kitchen. Anemia   . Aortic stenosis    a. Mild by echo 10/2013.  Marland Kitchen. Arthritis    "knees" (05/03/2017)   . CAD (coronary artery disease)    a. Moderate LAD lesion by cath 10/15/13 with nonischemic nuclear stress test 10/17/13  . Diastolic CHF (HCC)    a. Dx 10/2013 - normal EF but elevated LVEDP by cath.  . GERD (gastroesophageal reflux disease)    mild   . Heart failure (HCC)   . Heart murmur   . History of blood transfusion 1965   "related to OR"  . Hypercholesterolemia   . Hypertension   . Migraine    "they've faded out; might have 1/year; if that; not as strong as when I was a kid" (05/03/2017)  . S/P angioplasty with stent 05/03/17 to mLAD with DES and ostium of D2 with 50% residual stenosis.   05/04/2017  . Type 2 diabetes, diet controlled (HCC)     Past Surgical History:  Procedure Laterality Date  . APPENDECTOMY  1965  . BREAST BIOPSY Right ~ 2000; ~ 2002"   "both benign"  . CARDIAC CATHETERIZATION    . CORONARY ANGIOPLASTY WITH STENT PLACEMENT  05/03/2017  . EXPLORATORY LAPAROTOMY  1965   Hairball removed from stomach  . INTRAVASCULAR PRESSURE WIRE/FFR STUDY N/A 05/03/2017   Procedure: INTRAVASCULAR PRESSURE WIRE/FFR STUDY;  Surgeon: Yvonne KendallEnd, Christopher, MD;  Location: [facility];  Service: Cardiovascular;  Laterality: N/A;  . KNEE ARTHROSCOPY Left 06/18/2013   Procedure: LEFT KNEE ARTHROSCOPY WITH DEBRIDEMENT;  Surgeon: Loanne DrillingFrank V Aluisio, MD;  Location: [facility];  Service: Orthopedics;  Laterality: Left;  . LEFT HEART CATH AND CORONARY ANGIOGRAPHY N/A 05/03/2017   Procedure: LEFT HEART CATH AND CORONARY ANGIOGRAPHY;  Surgeon: Yvonne KendallEnd, Christopher, MD;  Location: [facility];  Service: Cardiovascular;  Laterality: N/A;  . LEFT HEART CATHETERIZATION WITH CORONARY ANGIOGRAM N/A 10/15/2013   Procedure: LEFT HEART CATHETERIZATION WITH CORONARY ANGIOGRAM;  Surgeon: Iran Ouch, MD;  Location: [facility];  Service: Cardiovascular;  Laterality: N/A;    Current Outpatient Medications  Medication Sig Dispense Refill  . acetaminophen (TYLENOL) 325 MG tablet Take 650 mg by mouth every 6 (six) hours as needed for mild pain or headache.     . alendronate (FOSAMAX) 70 MG tablet Take 70 mg by mouth once a week.  11  . aspirin 81 MG tablet Take 81 mg by mouth daily.    Marland Kitchen atorvastatin (LIPITOR) 80 MG tablet Take 80 mg by mouth daily.     Marland Kitchen CALCIUM PO Take 1 tablet by mouth 2 (two) times daily.     . Cholecalciferol (VITAMIN D) 2000 UNITS CAPS Take 1 capsule by mouth daily.     . clopidogrel (PLAVIX) 75 MG tablet Take 1 tablet (75 mg total) by mouth daily with breakfast. 30 tablet 11  . ezetimibe (ZETIA) 10 MG tablet TAKE 1 TABLET BY MOUTH EVERY DAY 90 tablet 3  . furosemide (LASIX) 40 MG tablet Take 1 tablet (40 mg total) by mouth daily as needed (shortness of breath, fluid weight gain or fluid retention). 30 tablet 11  . hydrochlorothiazide (HYDRODIURIL) 25 MG tablet Take 1 tablet (25 mg total) by mouth daily. 90 tablet 3  . losartan (COZAAR) 100 MG tablet Take 1 tablet (100 mg total) by mouth daily. 90 tablet 3  . metoprolol succinate (TOPROL-XL) 25 MG 24 hr tablet TAKE 1 TABLET BY MOUTH EVERY DAY 90 tablet 2  . Multiple Vitamins-Minerals (MULTIVITAMIN PO) Take 1 tablet by mouth daily.     . nitroGLYCERIN (NITROSTAT) 0.4 MG SL tablet Place 1 tablet (0.4 mg total) under the tongue every 5 (five) minutes as needed for chest pain (up to 3 doses). 25 tablet 5  . ONE TOUCH ULTRA TEST test strip USE TO TEST BLOOD SUGAR ONCE A DAY  3   No current facility-administered medications for this visit.     Family History  Problem Relation Age of Onset  . Hypertension Mother   . Heart disease Mother   . Osteoporosis Sister     Review of Systems  Constitutional: Negative.   HENT: Negative.   Eyes: Negative.   Respiratory: Negative.   Cardiovascular: Negative.   Gastrointestinal: Negative.   Endocrine: Negative.   Genitourinary: Negative.   Musculoskeletal: Negative.   Allergic/Immunologic: Negative.   Neurological: Negative.   Hematological: Bruises/bleeds easily.  Psychiatric/Behavioral: Negative.     Exam:   BP (!) 144/78 (BP Location: Left Arm, Patient Position: Sitting, Cuff Size: Normal)   Pulse 64   Resp 12   Ht 5' 1.75" (1.568 m)   Wt 165 lb 1.6 oz (74.9 kg)   LMP 08/07/1998   BMI 30.44 kg/m   Weight  change: @WEIGHTCHANGE @ Height:   Height: 5' 1.75" (156.8 cm)  Ht Readings from Last 3 Encounters:  09/06/18 5' 1.75" (1.568 m)  06/06/18 5\' 2"  (1.575 m)  12/13/17 5\' 1"  (1.549 m)    General appearance: alert, cooperative and appears stated age Head: Normocephalic, without obvious abnormality, atraumatic Neck: no adenopathy, supple, symmetrical, trachea midline and thyroid normal to inspection and palpation Lungs: clear to auscultation bilaterally Cardiovascular: regular rate and rhythm, loud systolic murmur.  Breasts: normal appearance, no masses or tenderness Abdomen: soft, non-tender; non distended,  no masses,  no organomegaly Extremities: extremities normal, atraumatic, no cyanosis or edema Skin: Skin color, texture, turgor normal. No rashes or lesions Lymph nodes: Cervical, supraclavicular, and axillary nodes normal. No abnormal inguinal nodes palpated Neurologic: Grossly normal   Pelvic: External genitalia:  no lesions              Urethra:  normal appearing urethra with no masses, tenderness or lesions              Bartholins and Skenes: normal                 Vagina: normal appearing vagina with normal color and discharge, no lesions              Cervix: no lesions               Bimanual Exam:  Uterus:  normal size, contour, position, consistency, mobility, non-tender              Adnexa: no mass, fullness, tenderness               Rectovaginal: Confirms               Anus:  normal sphincter tone, no lesions  Chaperone was present for exam.  A:  Well Woman with normal exam  Osteoporosis on medication, followed with her primary  CAD, followed by Cardiology  P:   No pap this year  Overdue for mammogram, she will schedule  Colonoscopy due this year  Discussed breast self exam  Discussed calcium and vit D intake

## 2018-09-06 ENCOUNTER — Ambulatory Visit (INDEPENDENT_AMBULATORY_CARE_PROVIDER_SITE_OTHER): Payer: Medicare Other | Admitting: Obstetrics and Gynecology

## 2018-09-06 ENCOUNTER — Other Ambulatory Visit: Payer: Self-pay

## 2018-09-06 ENCOUNTER — Encounter: Payer: Self-pay | Admitting: Obstetrics and Gynecology

## 2018-09-06 VITALS — BP 144/78 | HR 64 | Resp 12 | Ht 61.75 in | Wt 165.1 lb

## 2018-09-06 DIAGNOSIS — Z124 Encounter for screening for malignant neoplasm of cervix: Secondary | ICD-10-CM | POA: Diagnosis not present

## 2018-09-06 DIAGNOSIS — Z01419 Encounter for gynecological examination (general) (routine) without abnormal findings: Secondary | ICD-10-CM

## 2018-09-06 NOTE — Patient Instructions (Signed)
EXERCISE AND DIET:  We recommended that you start or continue a regular exercise program for good health. Regular exercise means any activity that makes your heart beat faster and makes you sweat.  We recommend exercising at least 30 minutes per day at least 3 days a week, preferably 4 or 5.  We also recommend a diet low in fat and sugar.  Inactivity, poor dietary choices and obesity can cause diabetes, heart attack, stroke, and kidney damage, among others.    ALCOHOL AND SMOKING:  Women should limit their alcohol intake to no more than 7 drinks/beers/glasses of wine (combined, not each!) per week. Moderation of alcohol intake to this level decreases your risk of breast cancer and liver damage. And of course, no recreational drugs are part of a healthy lifestyle.  And absolutely no smoking or even second hand smoke. Most people know smoking can cause heart and lung diseases, but did you know it also contributes to weakening of your bones? Aging of your skin?  Yellowing of your teeth and nails?  CALCIUM AND VITAMIN D:  Adequate intake of calcium and Vitamin D are recommended.  The recommendations for exact amounts of these supplements seem to change often, but generally speaking 1,200 mg of calcium (between diet and supplement) and 800 units of Vitamin D per day seems prudent. Certain women may benefit from higher intake of Vitamin D.  If you are among these women, your doctor will have told you during your visit.    PAP SMEARS:  Pap smears, to check for cervical cancer or precancers,  have traditionally been done yearly, although recent scientific advances have shown that most women can have pap smears less often.  However, every woman still should have a physical exam from her gynecologist every year. It will include a breast check, inspection of the vulva and vagina to check for abnormal growths or skin changes, a visual exam of the cervix, and then an exam to evaluate the size and shape of the uterus and  ovaries.  And after 69 years of age, a rectal exam is indicated to check for rectal cancers. We will also provide age appropriate advice regarding health maintenance, like when you should have certain vaccines, screening for sexually transmitted diseases, bone density testing, colonoscopy, mammograms, etc.   MAMMOGRAMS:  All women over 40 years old should have a yearly mammogram. Many facilities now offer a "3D" mammogram, which may cost around $50 extra out of pocket. If possible,  we recommend you accept the option to have the 3D mammogram performed.  It both reduces the number of women who will be called back for extra views which then turn out to be normal, and it is better than the routine mammogram at detecting truly abnormal areas.    COLON CANCER SCREENING: Now recommend starting at age 45. At this time colonoscopy is not covered for routine screening until 50. There are take home tests that can be done between 45-49.   COLONOSCOPY:  Colonoscopy to screen for colon cancer is recommended for all women at age 50.  We know, you hate the idea of the prep.  We agree, BUT, having colon cancer and not knowing it is worse!!  Colon cancer so often starts as a polyp that can be seen and removed at colonscopy, which can quite literally save your life!  And if your first colonoscopy is normal and you have no family history of colon cancer, most women don't have to have it again for   10 years.  Once every ten years, you can do something that may end up saving your life, right?  We will be happy to help you get it scheduled when you are ready.  Be sure to check your insurance coverage so you understand how much it will cost.  It may be covered as a preventative service at no cost, but you should check your particular policy.      Breast Self-Awareness Breast self-awareness means being familiar with how your breasts look and feel. It involves checking your breasts regularly and reporting any changes to your  health care provider. Practicing breast self-awareness is important. A change in your breasts can be a sign of a serious medical problem. Being familiar with how your breasts look and feel allows you to find any problems early, when treatment is more likely to be successful. All women should practice breast self-awareness, including women who have had breast implants. How to do a breast self-exam One way to learn what is normal for your breasts and whether your breasts are changing is to do a breast self-exam. To do a breast self-exam: Look for Changes  1. Remove all the clothing above your waist. 2. Stand in front of a mirror in a room with good lighting. 3. Put your hands on your hips. 4. Push your hands firmly downward. 5. Compare your breasts in the mirror. Look for differences between them (asymmetry), such as: ? Differences in shape. ? Differences in size. ? Puckers, dips, and bumps in one breast and not the other. 6. Look at each breast for changes in your skin, such as: ? Redness. ? Scaly areas. 7. Look for changes in your nipples, such as: ? Discharge. ? Bleeding. ? Dimpling. ? Redness. ? A change in position. Feel for Changes Carefully feel your breasts for lumps and changes. It is best to do this while lying on your back on the floor and again while sitting or standing in the shower or tub with soapy water on your skin. Feel each breast in the following way:  Place the arm on the side of the breast you are examining above your head.  Feel your breast with the other hand.  Start in the nipple area and make  inch (2 cm) overlapping circles to feel your breast. Use the pads of your three middle fingers to do this. Apply light pressure, then medium pressure, then firm pressure. The light pressure will allow you to feel the tissue closest to the skin. The medium pressure will allow you to feel the tissue that is a little deeper. The firm pressure will allow you to feel the tissue  close to the ribs.  Continue the overlapping circles, moving downward over the breast until you feel your ribs below your breast.  Move one finger-width toward the center of the body. Continue to use the  inch (2 cm) overlapping circles to feel your breast as you move slowly up toward your collarbone.  Continue the up and down exam using all three pressures until you reach your armpit.  Write Down What You Find  Write down what is normal for each breast and any changes that you find. Keep a written record with breast changes or normal findings for each breast. By writing this information down, you do not need to depend only on memory for size, tenderness, or location. Write down where you are in your menstrual cycle, if you are still menstruating. If you are having trouble noticing differences   in your breasts, do not get discouraged. With time you will become more familiar with the variations in your breasts and more comfortable with the exam. How often should I examine my breasts? Examine your breasts every month. If you are breastfeeding, the best time to examine your breasts is after a feeding or after using a breast pump. If you menstruate, the best time to examine your breasts is 5-7 days after your period is over. During your period, your breasts are lumpier, and it may be more difficult to notice changes. When should I see my health care provider? See your health care provider if you notice:  A change in shape or size of your breasts or nipples.  A change in the skin of your breast or nipples, such as a reddened or scaly area.  Unusual discharge from your nipples.  A lump or thick area that was not there before.  Pain in your breasts.  Anything that concerns you.   Mammogram Facilities  Yearly screening mammograms are recommended for women beginning at age 40. For a routine screening mammogram, you may schedule the appointment and have it done at the location of your choice.   Please ask the facility to send the results to our office. (fax [phone]) Location options include:  *[facility] [address], [address] [place], [place] [address] [phone]  [facility] [address], [address] [place], [place] [address] [phone]  

## 2018-09-11 NOTE — Telephone Encounter (Signed)
Patient was seen on 09/06/2018 for aex with Dr.Jertson. Encounter closed.

## 2018-09-13 DIAGNOSIS — M1711 Unilateral primary osteoarthritis, right knee: Secondary | ICD-10-CM | POA: Diagnosis not present

## 2018-09-13 DIAGNOSIS — M1712 Unilateral primary osteoarthritis, left knee: Secondary | ICD-10-CM | POA: Diagnosis not present

## 2018-09-18 ENCOUNTER — Telehealth: Payer: Self-pay | Admitting: *Deleted

## 2018-09-18 NOTE — Telephone Encounter (Signed)
Yes! Its ok to hold Plavix

## 2018-09-18 NOTE — Telephone Encounter (Signed)
   [facility] Pre-operative Risk Assessment    Request for surgical clearance:  1. What type of surgery is being performed? LEFT TOTAL KNEE   2. When is this surgery scheduled? 11/11/18   3. What type of clearance is required (medical clearance vs. Pharmacy clearance to hold med vs. Both)? MEDICAL  4. Are there any medications that need to be held prior to surgery and how long?PLAVIX, ASA   5. Practice name and name of physician performing surgery? [facility]; DR. Wynelle Link   6. What is your office phone number [phone]    7.   What is your office fax number [phone]  8.   Anesthesia type (None, local, MAC, general) ? CHOICE   Julaine Hua 09/18/2018, 9:55 AM  _________________________________________________________________   (provider comments below)

## 2018-09-18 NOTE — Telephone Encounter (Signed)
Dr. Delton See, ok to hold ASA and plavix for 5-7 days prior to surgery? Last PCI was 05/03/2017

## 2018-09-19 NOTE — Telephone Encounter (Signed)
   Primary Cardiologist: Tobias Alexander, MD  Chart reviewed as part of pre-operative protocol coverage. Given past medical history and time since last visit, based on ACC/AHA guidelines, Kerry Perez would be at acceptable risk for the planned procedure without further cardiovascular testing.   Dr. Delton See has documented also concerning the ASA and Plavix. She is to hold these medications for 5-7 days prior to left total knee replacement.   I will route this recommendation to the requesting party via Epic fax function and remove from pre-op pool.  Please call with questions.  Kerry Perez. Kerry Perez, ANP, AACC  09/19/2018, 9:33 AM

## 2018-10-08 DIAGNOSIS — M179 Osteoarthritis of knee, unspecified: Secondary | ICD-10-CM | POA: Diagnosis not present

## 2018-10-08 DIAGNOSIS — M1712 Unilateral primary osteoarthritis, left knee: Secondary | ICD-10-CM | POA: Diagnosis not present

## 2018-10-10 ENCOUNTER — Other Ambulatory Visit: Payer: Self-pay | Admitting: Obstetrics and Gynecology

## 2018-10-10 ENCOUNTER — Telehealth: Payer: Self-pay | Admitting: Obstetrics and Gynecology

## 2018-10-10 DIAGNOSIS — Z1231 Encounter for screening mammogram for malignant neoplasm of breast: Secondary | ICD-10-CM

## 2018-10-10 NOTE — Telephone Encounter (Signed)
Erroneous encounter

## 2018-10-14 ENCOUNTER — Ambulatory Visit
Admission: RE | Admit: 2018-10-14 | Discharge: 2018-10-14 | Disposition: A | Discharge status: Home / Self Care | Payer: Medicare Other | Source: Ambulatory Visit | Attending: Obstetrics and Gynecology | Admitting: Obstetrics and Gynecology

## 2018-10-14 DIAGNOSIS — Z1231 Encounter for screening mammogram for malignant neoplasm of breast: Secondary | ICD-10-CM

## 2018-10-24 DIAGNOSIS — Z0000 Encounter for general adult medical examination without abnormal findings: Secondary | ICD-10-CM | POA: Diagnosis not present

## 2018-10-24 DIAGNOSIS — E785 Hyperlipidemia, unspecified: Secondary | ICD-10-CM | POA: Diagnosis not present

## 2018-10-24 DIAGNOSIS — E119 Type 2 diabetes mellitus without complications: Secondary | ICD-10-CM | POA: Diagnosis not present

## 2018-10-24 DIAGNOSIS — I10 Essential (primary) hypertension: Secondary | ICD-10-CM | POA: Diagnosis not present

## 2018-10-24 DIAGNOSIS — Z23 Encounter for immunization: Secondary | ICD-10-CM | POA: Diagnosis not present

## 2018-11-11 ENCOUNTER — Inpatient Hospital Stay: Admit: 2018-11-11 | Payer: Medicare Other | Admitting: Orthopedic Surgery

## 2018-11-11 SURGERY — ARTHROPLASTY, KNEE, TOTAL
Anesthesia: Choice | Laterality: Left

## 2019-01-12 ENCOUNTER — Other Ambulatory Visit: Payer: Self-pay | Admitting: Cardiology

## 2019-01-19 ENCOUNTER — Encounter (HOSPITAL_COMMUNITY): Payer: Self-pay

## 2019-01-19 ENCOUNTER — Emergency Department (HOSPITAL_COMMUNITY)
Admission: EM | Admit: 2019-01-19 | Discharge: 2019-01-20 | Disposition: A | Discharge status: Home / Self Care | Payer: Medicare Other | Attending: Emergency Medicine | Admitting: Emergency Medicine

## 2019-01-19 DIAGNOSIS — Z955 Presence of coronary angioplasty implant and graft: Secondary | ICD-10-CM | POA: Diagnosis not present

## 2019-01-19 DIAGNOSIS — L03115 Cellulitis of right lower limb: Secondary | ICD-10-CM | POA: Diagnosis not present

## 2019-01-19 DIAGNOSIS — Z7901 Long term (current) use of anticoagulants: Secondary | ICD-10-CM | POA: Insufficient documentation

## 2019-01-19 DIAGNOSIS — I110 Hypertensive heart disease with heart failure: Secondary | ICD-10-CM | POA: Diagnosis not present

## 2019-01-19 DIAGNOSIS — Z79899 Other long term (current) drug therapy: Secondary | ICD-10-CM | POA: Diagnosis not present

## 2019-01-19 DIAGNOSIS — I5032 Chronic diastolic (congestive) heart failure: Secondary | ICD-10-CM | POA: Insufficient documentation

## 2019-01-19 DIAGNOSIS — R2241 Localized swelling, mass and lump, right lower limb: Secondary | ICD-10-CM | POA: Diagnosis not present

## 2019-01-19 DIAGNOSIS — I2510 Atherosclerotic heart disease of native coronary artery without angina pectoris: Secondary | ICD-10-CM | POA: Diagnosis not present

## 2019-01-19 DIAGNOSIS — M7989 Other specified soft tissue disorders: Secondary | ICD-10-CM | POA: Diagnosis present

## 2019-01-19 DIAGNOSIS — W010XXA Fall on same level from slipping, tripping and stumbling without subsequent striking against object, initial encounter: Secondary | ICD-10-CM | POA: Insufficient documentation

## 2019-01-19 NOTE — ED Triage Notes (Signed)
Pt states that last week she fell and tripped over a box, no LOC bruising to her R eye, R lower leg and R ankle, swelling to ankle is worse, pt ambulatory

## 2019-01-20 ENCOUNTER — Other Ambulatory Visit: Payer: Self-pay

## 2019-01-20 ENCOUNTER — Ambulatory Visit (HOSPITAL_BASED_OUTPATIENT_CLINIC_OR_DEPARTMENT_OTHER)
Admission: RE | Admit: 2019-01-20 | Discharge: 2019-01-20 | Disposition: A | Discharge status: Home / Self Care | Payer: Medicare Other | Source: Ambulatory Visit | Attending: Emergency Medicine | Admitting: Emergency Medicine

## 2019-01-20 DIAGNOSIS — L03115 Cellulitis of right lower limb: Secondary | ICD-10-CM | POA: Diagnosis not present

## 2019-01-20 DIAGNOSIS — R609 Edema, unspecified: Secondary | ICD-10-CM | POA: Diagnosis not present

## 2019-01-20 MED ORDER — SULFAMETHOXAZOLE-TRIMETHOPRIM 800-160 MG PO TABS: 1.0000 | ORAL_TABLET | Freq: Two times a day (BID) | ORAL | 0 refills | Status: AC

## 2019-01-20 MED ORDER — CEPHALEXIN 500 MG PO CAPS: 500.0000 mg | ORAL_CAPSULE | Freq: Four times a day (QID) | ORAL | 0 refills | Status: AC

## 2019-01-20 NOTE — ED Provider Notes (Signed)
[facility] EM[facility]ENC[facility] Provider Note   CSN: [id number] Arrival date & time: 01/19/19  2242     History   Chief Complaint Chief Complaint  Patient presents with  . Fall    HPI Kerry ForgetCatherine J Kaneshiro is a 68 y.o. female.     The history is provided by the patient and medical records. No language interpreter was used.  Fall Pertinent negatives include no chest pain and no shortness of breath.   Kerry Perez is a 68 y.o. female  with a PMH as listed below who presents to the Emergency Department complaining of redness and swelling to her right lower extremity.  Patient states that she fell a little over a week ago.  She was trying to get to her phone in the middle of the night because her daughter called stating that she was going into labor.  She is very excited and did not realize there was a box on the ground.  She tripped over the box and fell hitting her head and cutting her leg.  She thought it was just some scratches to her leg and did not seek care.  She is been cleaning it with soap and water.  She did not pass out.  She does have bruising under her eye.  She states that she is not concerned about her head and knows that it is just bruised and will heal on its own.  She denies any difficulty with ambulation, numbness or weakness or any other type of neurologic complaints.  She is concerned because she noticed redness around the abrasions to her right leg yesterday.  Throughout the day today, she felt as if it became more painful and started getting bigger.  She spoke to a family member who told her that it could be infected and recommended that she get checked out.  She states that she has been told she has diabetes in the past, but is currently diet controlled and not on any medications.  She watches her diet very closely, but notes that people with diabetes get infections to their extremities easier and wants to be conservative.  Past Medical History:   Diagnosis Date  . Abnormal cardiac CT angiography    a. Ancillary findings: prominent hilar lymphoid tissue nonspecific - possibly related to CHF and lymphatic congestion, clinical correlation for signs/sx of ymphoproliferative disorder or other malignancy is suggested - denied constitutional sx and will f/u with PCP.  Marland Kitchen. Anemia   . Aortic stenosis    a. Mild by echo 10/2013.  Marland Kitchen. Arthritis    "knees" (05/03/2017)   . CAD (coronary artery disease)    a. Moderate LAD lesion by cath 10/15/13 with nonischemic nuclear stress test 10/17/13  . Diastolic CHF (HCC)    a. Dx 10/2013 - normal EF but elevated LVEDP by cath.  . GERD (gastroesophageal reflux disease)    mild  . Heart failure (HCC)   . Heart murmur   . History of blood transfusion 1965   "related to OR"  . Hypercholesterolemia   . Hypertension   . Migraine    "they've faded out; might have 1/year; if that; not as strong as when I was a kid" (05/03/2017)  . S/P angioplasty with stent 05/03/17 to mLAD with DES and ostium of D2 with 50% residual stenosis.   05/04/2017  . Type 2 diabetes, diet controlled [facility](HCC)     Patient Active Problem List   Diagnosis Date Noted  . Moderate aortic stenosis  08/13/2017  . S/P angioplasty with stent 05/03/17 to mLAD with DES and ostium of D2 with 50% residual stenosis.   05/04/2017  . Chronic diastolic heart failure (HCC) 05/04/2017  . Stable angina (HCC) 05/03/2017  . Abnormal cardiac CT angiography 04/24/2017  . Pre-procedure lab exam 04/24/2017  . Hyperlipidemia 04/11/2017  . Mild aortic stenosis 08/12/2015  . Lung nodule 08/12/2015  . Lung nodule, solitary 08/12/2015  . Lung nodule seen on imaging study 08/12/2015  . Coronary artery disease due to lipid rich plaque 08/12/2015  . Orthostatic hypotension 03/17/2014  . Acute diastolic heart failure, NYHA class 2 (HCC) 10/16/2013  . CAD (coronary artery disease) 10/16/2013  . Unstable angina (HCC) 10/16/2013  . Chest pain 10/15/2013  . Chest pain on  exertion 10/14/2013  . DOE (dyspnea on exertion) 10/14/2013  . Acute medial meniscal tear 06/17/2013  . Essential hypertension, benign 04/28/2013  . Pure hypercholesterolemia 04/28/2013    Past Surgical History:  Procedure Laterality Date  . APPENDECTOMY  1965  . BREAST BIOPSY Right ~ 2000; ~ 2002"   "both benign"  . CARDIAC CATHETERIZATION    . CORONARY ANGIOPLASTY WITH STENT PLACEMENT  05/03/2017  . EXPLORATORY LAPAROTOMY  1965   Hairball removed from stomach  . INTRAVASCULAR PRESSURE WIRE/FFR STUDY N/A 05/03/2017   Procedure: INTRAVASCULAR PRESSURE WIRE/FFR STUDY;  Surgeon: Yvonne KendallEnd, Christopher, MD;  Location: [facility] INVASIVE CV LAB;  Service: Cardiovascular;  Laterality: N/A;  . KNEE ARTHROSCOPY Left 06/18/2013   Procedure: LEFT KNEE ARTHROSCOPY WITH DEBRIDEMENT;  Surgeon: Loanne DrillingFrank V Aluisio, MD;  Location: [facility] ORS;  Service: Orthopedics;  Laterality: Left;  . LEFT HEART CATH AND CORONARY ANGIOGRAPHY N/A 05/03/2017   Procedure: LEFT HEART CATH AND CORONARY ANGIOGRAPHY;  Surgeon: Yvonne KendallEnd, Christopher, MD;  Location: [facility] INVASIVE CV LAB;  Service: Cardiovascular;  Laterality: N/A;  . LEFT HEART CATHETERIZATION WITH CORONARY ANGIOGRAM N/A 10/15/2013   Procedure: LEFT HEART CATHETERIZATION WITH CORONARY ANGIOGRAM;  Surgeon: Iran OuchMuhammad A Arida, MD;  Location: [facility] CATH LAB;  Service: Cardiovascular;  Laterality: N/A;     OB History    Gravida  4   Para  3   Term  3   Preterm      AB      Living  3     SAB      TAB      Ectopic      Multiple      Live Births  3            Home Medications    Prior to Admission medications   Medication Sig Start Date End Date Taking? Authorizing Provider  acetaminophen (TYLENOL) 325 MG tablet Take 650 mg by mouth every 6 (six) hours as needed for mild pain or headache.     [provider]  alendronate (FOSAMAX) 70 MG tablet Take 70 mg by mouth once a week. 04/10/18   [provider]  aspirin 81 MG tablet Take 81 mg by mouth daily.     [provider]  atorvastatin (LIPITOR) 80 MG tablet Take 80 mg by mouth daily.    [provider]  CALCIUM PO Take 1 tablet by mouth 2 (two) times daily.     [provider]  cephALEXin (KEFLEX) 500 MG capsule Take 1 capsule (500 mg total) by mouth 4 (four) times daily for 7 days. 01/20/19 01/27/19  Torra Pala, Chase PicketJaime Pilcher, PA-C  Cholecalciferol (VITAMIN D) 2000 UNITS CAPS Take 1 capsule by mouth daily.     [provider]  clopidogrel (PLAVIX) 75 MG tablet Take 1 tablet (75 mg total) by mouth daily with breakfast. Please call and schedule an appointment for further refills 01/13/19   Dorothy Spark, MD  ezetimibe (ZETIA) 10 MG tablet TAKE 1 TABLET BY MOUTH EVERY DAY 01/07/18   Dorothy Spark, MD  furosemide (LASIX) 40 MG tablet Take 1 tablet (40 mg total) by mouth daily as needed (shortness of breath, fluid weight gain or fluid retention). 08/12/15   Dorothy Spark, MD  hydrochlorothiazide (HYDRODIURIL) 25 MG tablet Take 1 tablet (25 mg total) by mouth daily. 06/06/18   Dorothy Spark, MD  losartan (COZAAR) 100 MG tablet Take 1 tablet (100 mg total) by mouth daily. 06/06/18   Dorothy Spark, MD  metoprolol succinate (TOPROL-XL) 25 MG 24 hr tablet TAKE 1 TABLET BY MOUTH EVERY DAY 08/22/18   Dorothy Spark, MD  Multiple Vitamins-Minerals (MULTIVITAMIN PO) Take 1 tablet by mouth daily.     [provider]  nitroGLYCERIN (NITROSTAT) 0.4 MG SL tablet Place 1 tablet (0.4 mg total) under the tongue every 5 (five) minutes as needed for chest pain (up to 3 doses). 05/07/17   Dorothy Spark, MD  ONE TOUCH ULTRA TEST test strip USE TO TEST BLOOD SUGAR ONCE A DAY 03/02/17   [provider]  sulfamethoxazole-trimethoprim (BACTRIM DS) 800-160 MG tablet Take 1 tablet by mouth 2 (two) times daily for 7 days. 01/20/19 01/27/19  Rechelle Perez, Ozella Almond, PA-C    Family History Family History  Problem Relation Age of Onset  . Hypertension Mother   .  Heart disease Mother   . Osteoporosis Sister     Social History Social History   Tobacco Use  . Smoking status: Never Smoker  . Smokeless tobacco: Never Used  Substance Use Topics  . Alcohol use: Yes    Alcohol/week: 4.0 standard drinks    Types: 4 Glasses of wine per week  . Drug use: No     Allergies   Lisinopril   Review of Systems Review of Systems  Constitutional: Negative for fever.  Respiratory: Negative for shortness of breath.   Cardiovascular: Positive for leg swelling. Negative for chest pain and palpitations.  Musculoskeletal: Positive for arthralgias and myalgias.  Skin: Positive for color change and wound.  Neurological: Negative for weakness and numbness.  All other systems reviewed and are negative.    Physical Exam Updated Vital Signs BP (!) 155/61   Pulse 71   Temp 98 F (36.7 C) (Oral)   Resp 18   LMP 08/07/1998   SpO2 98%   Physical Exam Vitals signs and nursing note reviewed.  Constitutional:      General: She is not in acute distress.    Appearance: She is well-developed.  HENT:     Head: Normocephalic.     Comments: Ecchymosis under the right eye as well as swelling above the right eyelid.  Patient states this is improving day-to-day over the last week.  No hemotympanum.  No septal hematoma.  Eyes:     Pupils: Pupils are equal, round, and reactive to light.     Comments: EOMs intact and without pain.  Neck:     Musculoskeletal: Neck supple.  Cardiovascular:     Rate and Rhythm: Normal rate and regular rhythm.     Heart sounds: Normal heart sounds. No murmur.  Pulmonary:     Effort: Pulmonary effort is normal. No respiratory distress.     Breath sounds: Normal  breath sounds.  Abdominal:     General: There is no distension.     Palpations: Abdomen is soft.     Tenderness: There is no abdominal tenderness.  Musculoskeletal:     Right lower leg: Edema present.     Comments: No bony tenderness to the ankle or knee.  She has mild  tenderness to the area of redness / wound as described in skin exam, but no tenderness to suggest underlying MSK pathology.  Bilateral lower extremities are neurovascularly intact with full range of motion and 5/5 strength.  Skin:    General: Skin is warm and dry.     Comments: Four 1 cm scabbed over abrasions to the right shin with surrounding erythema.   Neurological:     Mental Status: She is alert and oriented to person, place, and time.     Comments: Speech clear and goal oriented. CN 2-12 grossly intact. Normal finger-to-nose and rapid alternating movements. No drift. Strength and sensation intact. Steady gait.       ED Treatments / Results  Labs (all labs ordered are listed, but only abnormal results are displayed) Labs Reviewed - No data to display  EKG    Radiology No results found.  Procedures Procedures (including critical care time)  Medications Ordered in ED Medications - No data to display   Initial Impression / Assessment and Plan / ED Course  I have reviewed the triage vital signs and the nursing notes.  Pertinent labs & imaging results that were available during my care of the patient were reviewed by me and considered in my medical decision making (see chart for details).       ALIVEAH GALLANT is a 68 y.o. female who presents to ED for concerns of wound to her right lower extremity for fall last week.  Of note, she did hit her head and has some bruising on exam.  She reports this is improving each day and she is not concerned about this at all.  She has normal neurologic exam.  EOMs intact without any pain.  Doubt orbital floor fracture.  Encourage symptomatic home care and did discuss reasons that she should return giving her head injury/fall.  As far as her complaint for wound, she does have erythema surrounding scabs to her right shin concerning for cellulitis.  Will start on Bactrim and Keflex.  She does have a mild amount of swelling to the foot and  lower extremity as well.  Believe this is likely due to cellulitis, however given her age and comorbidities, do feel it is due diligence to obtain ultrasound to rule out blood clot as she is likely not been as active after her fall.  We do not have ultrasound in the department at this hour, but she agrees to come back in the morning, therefore I placed the order for outpatient DVT study.  She is not having any chest pain or shortness of breath to suggest PE.  We discussed home wound care instructions, and importance of antibiotic compliance, return precautions and PCP follow-up.  All questions were answered.  Patient discussed with Dr. Nicanor Alcon who agrees with treatment plan.    Final Clinical Impressions(s) / ED Diagnoses   Final diagnoses:  Cellulitis of right lower extremity  Swelling of right lower extremity    ED Discharge Orders         Ordered    sulfamethoxazole-trimethoprim (BACTRIM DS) 800-160 MG tablet  2 times daily     01/20/19 0004  cephALEXin (KEFLEX) 500 MG capsule  4 times daily     01/20/19 0004    LE VENOUS     01/20/19 0005           Sohum Delillo, Chase PicketJaime Pilcher, PA-C 01/20/19 0027    Palumbo, April, MD 01/20/19 [id number]

## 2019-01-20 NOTE — Progress Notes (Signed)
RLE venous duplex       has been completed. Preliminary results can be found under CV proc through chart review. Nolan Tuazon, BS, RDMS, RVT   

## 2019-01-20 NOTE — Discharge Instructions (Signed)
It was my pleasure taking care of you today!  Please take all of your antibiotics until finished!  I have ordered an ultrasound for you in the morning. See the instructions.   Call your primary care doctor tomorrow to schedule a follow up for recheck.   Return to ER for fever, chest pain, shortness of breath, new or worsening symptoms, any additional concerns.

## 2019-01-20 NOTE — ED Notes (Signed)
PT states understanding of care given, follow up care, and medication prescribed. PT ambulated from ED to car with a steady gait. 

## 2019-01-27 DIAGNOSIS — L03119 Cellulitis of unspecified part of limb: Secondary | ICD-10-CM | POA: Diagnosis not present

## 2019-01-28 DIAGNOSIS — S8011XD Contusion of right lower leg, subsequent encounter: Secondary | ICD-10-CM | POA: Diagnosis not present

## 2019-01-28 DIAGNOSIS — W19XXXD Unspecified fall, subsequent encounter: Secondary | ICD-10-CM | POA: Diagnosis not present

## 2019-02-03 DIAGNOSIS — S8011XD Contusion of right lower leg, subsequent encounter: Secondary | ICD-10-CM | POA: Diagnosis not present

## 2019-02-03 DIAGNOSIS — W19XXXD Unspecified fall, subsequent encounter: Secondary | ICD-10-CM | POA: Diagnosis not present

## 2019-03-02 ENCOUNTER — Other Ambulatory Visit: Payer: Self-pay | Admitting: Cardiology

## 2019-03-09 DIAGNOSIS — Z20828 Contact with and (suspected) exposure to other viral communicable diseases: Secondary | ICD-10-CM | POA: Diagnosis not present

## 2019-04-12 ENCOUNTER — Other Ambulatory Visit: Payer: Self-pay | Admitting: Cardiology

## 2019-05-14 DIAGNOSIS — Z23 Encounter for immunization: Secondary | ICD-10-CM | POA: Diagnosis not present

## 2019-05-29 ENCOUNTER — Other Ambulatory Visit: Payer: Self-pay | Admitting: Cardiology

## 2019-05-29 DIAGNOSIS — I10 Essential (primary) hypertension: Secondary | ICD-10-CM

## 2019-05-29 DIAGNOSIS — I5031 Acute diastolic (congestive) heart failure: Secondary | ICD-10-CM

## 2019-05-29 DIAGNOSIS — E7800 Pure hypercholesterolemia, unspecified: Secondary | ICD-10-CM

## 2019-06-12 ENCOUNTER — Other Ambulatory Visit: Payer: Self-pay | Admitting: Cardiology

## 2019-06-12 DIAGNOSIS — E7800 Pure hypercholesterolemia, unspecified: Secondary | ICD-10-CM

## 2019-06-12 DIAGNOSIS — I5031 Acute diastolic (congestive) heart failure: Secondary | ICD-10-CM

## 2019-06-12 DIAGNOSIS — I10 Essential (primary) hypertension: Secondary | ICD-10-CM

## 2019-06-20 ENCOUNTER — Other Ambulatory Visit: Payer: Self-pay | Admitting: Cardiology

## 2019-06-20 DIAGNOSIS — I5031 Acute diastolic (congestive) heart failure: Secondary | ICD-10-CM

## 2019-06-20 DIAGNOSIS — I10 Essential (primary) hypertension: Secondary | ICD-10-CM

## 2019-06-20 DIAGNOSIS — E7800 Pure hypercholesterolemia, unspecified: Secondary | ICD-10-CM

## 2019-07-13 ENCOUNTER — Other Ambulatory Visit: Payer: Self-pay | Admitting: Cardiology

## 2019-07-17 ENCOUNTER — Telehealth: Payer: Self-pay | Admitting: Cardiology

## 2019-07-17 DIAGNOSIS — E7800 Pure hypercholesterolemia, unspecified: Secondary | ICD-10-CM

## 2019-07-17 DIAGNOSIS — I5031 Acute diastolic (congestive) heart failure: Secondary | ICD-10-CM

## 2019-07-17 DIAGNOSIS — I10 Essential (primary) hypertension: Secondary | ICD-10-CM

## 2019-07-17 MED ORDER — METOPROLOL SUCCINATE ER 25 MG PO TB24: 25.0000 mg | ORAL_TABLET | Freq: Every day | ORAL | 0 refills | Status: DC

## 2019-07-17 MED ORDER — CLOPIDOGREL BISULFATE 75 MG PO TABS: 75.0000 mg | ORAL_TABLET | Freq: Every day | ORAL | 0 refills | Status: DC

## 2019-07-17 MED ORDER — LOSARTAN POTASSIUM 100 MG PO TABS: 100.0000 mg | ORAL_TABLET | Freq: Every day | ORAL | 0 refills | Status: AC

## 2019-07-17 NOTE — Telephone Encounter (Signed)
Yes please refill.  Thank you for your help!

## 2019-07-17 NOTE — Telephone Encounter (Signed)
Pt states that she has moved to [place] and will be seeing a cardiologist in January and would like Dr. Meda Coffee to refill her medications until then. Would Dr. Meda Coffee like to refill these medications? Please address

## 2019-07-17 NOTE — Telephone Encounter (Signed)
Pt's medications were sent to pt's pharmacy as requested per Winifred Olive, LPN,  90 day supply until pt see Cardiologist in [place] in January. Confirmation received.

## 2019-07-17 NOTE — Telephone Encounter (Signed)
New Message  Patient states that she has moved to [place] and is scheduled to start seeing a cardiologist in [place] on September 01, 2019 and states that she needs enough medication to last her until then.   *STAT* If patient is at the pharmacy, call can be transferred to refill team.   1. Which medications need to be refilled? (please list name of each medication and dose if known) clopidogrel (PLAVIX) 75 MG tablet  losartan (COZAAR) 100 MG tablet   metoprolol succinate (TOPROL-XL) 25 MG 24 hr tablet     2. Which pharmacy/location (including street and city if local pharmacy) is medication to be sent to?[id number] - [place], [place] - [address]  3. Do they need a 30 day or 90 day supply? 30 day

## 2019-07-24 ENCOUNTER — Other Ambulatory Visit: Payer: Self-pay | Admitting: Cardiology

## 2019-08-12 ENCOUNTER — Encounter: Payer: Self-pay | Admitting: *Deleted

## 2019-08-12 NOTE — Progress Notes (Signed)
Letter written by Dr. Delton See and sent to the pts mychart account and will be mailed to her as well.

## 2019-08-15 DIAGNOSIS — Z1389 Encounter for screening for other disorder: Secondary | ICD-10-CM | POA: Diagnosis not present

## 2019-08-15 DIAGNOSIS — E785 Hyperlipidemia, unspecified: Secondary | ICD-10-CM | POA: Diagnosis not present

## 2019-08-15 DIAGNOSIS — Z289 Immunization not carried out for unspecified reason: Secondary | ICD-10-CM | POA: Diagnosis not present

## 2019-08-15 DIAGNOSIS — E119 Type 2 diabetes mellitus without complications: Secondary | ICD-10-CM | POA: Diagnosis not present

## 2019-08-18 ENCOUNTER — Other Ambulatory Visit: Payer: Self-pay | Admitting: Cardiology

## 2019-08-21 DIAGNOSIS — E785 Hyperlipidemia, unspecified: Secondary | ICD-10-CM | POA: Diagnosis not present

## 2019-08-21 DIAGNOSIS — E119 Type 2 diabetes mellitus without complications: Secondary | ICD-10-CM | POA: Diagnosis not present

## 2019-08-21 DIAGNOSIS — Z1329 Encounter for screening for other suspected endocrine disorder: Secondary | ICD-10-CM | POA: Diagnosis not present

## 2019-08-21 DIAGNOSIS — R5383 Other fatigue: Secondary | ICD-10-CM | POA: Diagnosis not present

## 2019-08-21 DIAGNOSIS — Z13220 Encounter for screening for lipoid disorders: Secondary | ICD-10-CM | POA: Diagnosis not present

## 2019-08-21 DIAGNOSIS — E559 Vitamin D deficiency, unspecified: Secondary | ICD-10-CM | POA: Diagnosis not present

## 2019-08-21 DIAGNOSIS — E611 Iron deficiency: Secondary | ICD-10-CM | POA: Diagnosis not present

## 2019-08-21 DIAGNOSIS — Z1389 Encounter for screening for other disorder: Secondary | ICD-10-CM | POA: Diagnosis not present

## 2019-08-21 DIAGNOSIS — E538 Deficiency of other specified B group vitamins: Secondary | ICD-10-CM | POA: Diagnosis not present

## 2019-08-22 ENCOUNTER — Other Ambulatory Visit: Payer: Self-pay | Admitting: Cardiology

## 2019-08-22 DIAGNOSIS — E119 Type 2 diabetes mellitus without complications: Secondary | ICD-10-CM | POA: Diagnosis not present

## 2019-08-22 DIAGNOSIS — I10 Essential (primary) hypertension: Secondary | ICD-10-CM | POA: Diagnosis not present

## 2019-08-22 DIAGNOSIS — E785 Hyperlipidemia, unspecified: Secondary | ICD-10-CM | POA: Diagnosis not present

## 2019-08-22 DIAGNOSIS — I5020 Unspecified systolic (congestive) heart failure: Secondary | ICD-10-CM | POA: Diagnosis not present

## 2019-08-26 DIAGNOSIS — Z951 Presence of aortocoronary bypass graft: Secondary | ICD-10-CM | POA: Diagnosis not present

## 2019-08-26 DIAGNOSIS — I2510 Atherosclerotic heart disease of native coronary artery without angina pectoris: Secondary | ICD-10-CM | POA: Diagnosis not present

## 2019-08-27 DIAGNOSIS — I5020 Unspecified systolic (congestive) heart failure: Secondary | ICD-10-CM | POA: Diagnosis not present

## 2019-08-29 ENCOUNTER — Other Ambulatory Visit: Payer: Self-pay | Admitting: Cardiology

## 2019-09-02 DIAGNOSIS — R0989 Other specified symptoms and signs involving the circulatory and respiratory systems: Secondary | ICD-10-CM | POA: Diagnosis not present

## 2019-09-08 DIAGNOSIS — E611 Iron deficiency: Secondary | ICD-10-CM | POA: Diagnosis not present

## 2019-09-08 DIAGNOSIS — E785 Hyperlipidemia, unspecified: Secondary | ICD-10-CM | POA: Diagnosis not present

## 2019-09-08 DIAGNOSIS — Z289 Immunization not carried out for unspecified reason: Secondary | ICD-10-CM | POA: Diagnosis not present

## 2019-09-08 DIAGNOSIS — I10 Essential (primary) hypertension: Secondary | ICD-10-CM | POA: Diagnosis not present

## 2019-09-09 DIAGNOSIS — R3121 Asymptomatic microscopic hematuria: Secondary | ICD-10-CM | POA: Diagnosis not present

## 2019-09-09 DIAGNOSIS — R7303 Prediabetes: Secondary | ICD-10-CM | POA: Diagnosis not present

## 2019-09-09 DIAGNOSIS — K640 First degree hemorrhoids: Secondary | ICD-10-CM | POA: Diagnosis not present

## 2019-09-09 DIAGNOSIS — Z1231 Encounter for screening mammogram for malignant neoplasm of breast: Secondary | ICD-10-CM | POA: Diagnosis not present

## 2019-09-09 DIAGNOSIS — E119 Type 2 diabetes mellitus without complications: Secondary | ICD-10-CM | POA: Diagnosis not present

## 2019-09-09 DIAGNOSIS — N952 Postmenopausal atrophic vaginitis: Secondary | ICD-10-CM | POA: Diagnosis not present

## 2019-09-09 DIAGNOSIS — M810 Age-related osteoporosis without current pathological fracture: Secondary | ICD-10-CM | POA: Diagnosis not present

## 2019-09-09 DIAGNOSIS — I350 Nonrheumatic aortic (valve) stenosis: Secondary | ICD-10-CM | POA: Diagnosis not present

## 2019-09-09 DIAGNOSIS — E785 Hyperlipidemia, unspecified: Secondary | ICD-10-CM | POA: Diagnosis not present

## 2019-09-09 DIAGNOSIS — I10 Essential (primary) hypertension: Secondary | ICD-10-CM | POA: Diagnosis not present

## 2019-09-10 ENCOUNTER — Ambulatory Visit: Payer: Medicare Other | Admitting: Obstetrics and Gynecology

## 2019-10-06 IMAGING — MG DIGITAL SCREENING BILATERAL MAMMOGRAM WITH TOMO AND CAD
6 of 12 series · 6 of 36 positions shown · non-contrast
Comparison: Previous exam(s).

CLINICAL DATA: Screening.

EXAM:
DIGITAL SCREENING BILATERAL MAMMOGRAM WITH TOMO AND CAD

[R CC synth-2D (1 of 2)]
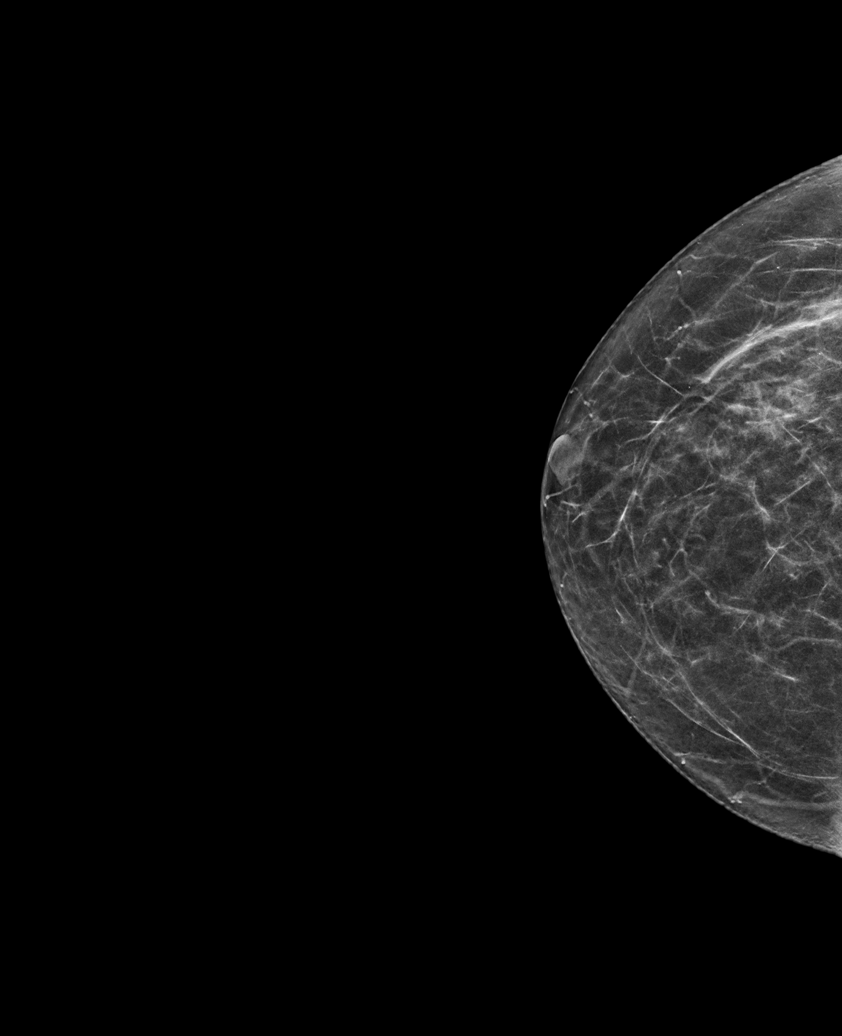

[R CC synth-2D (2 of 2)]
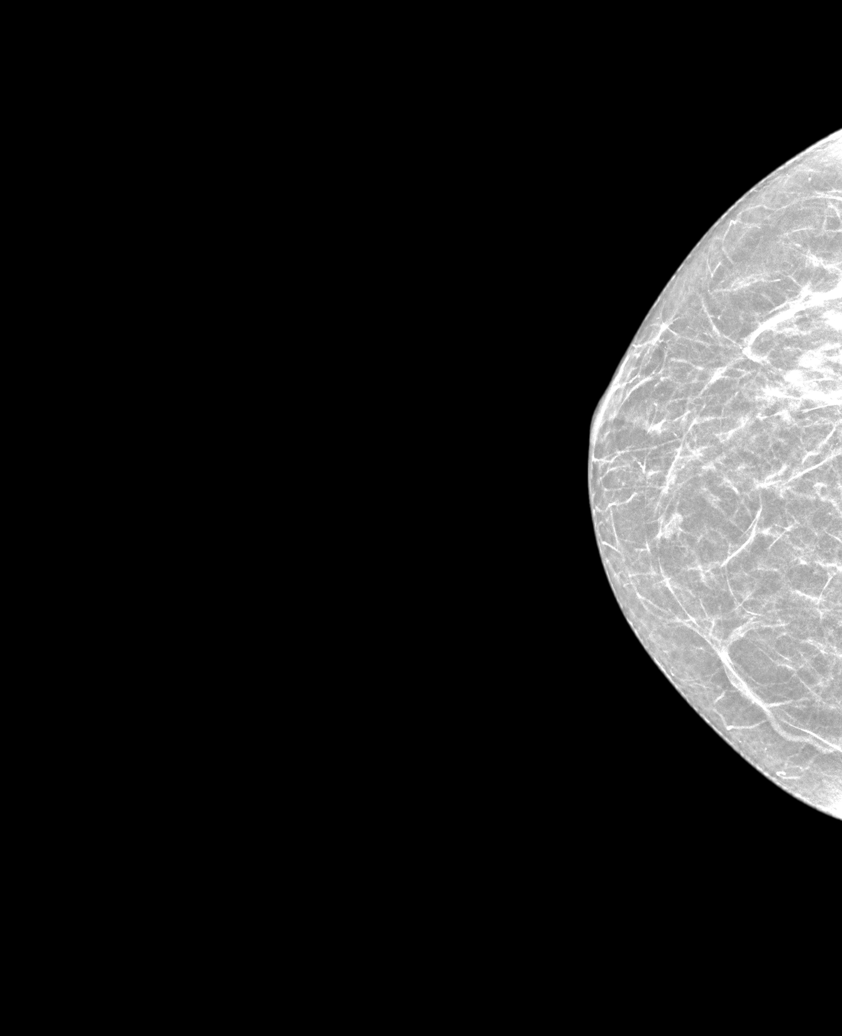

[L CC synth-2D (1 of 2)]
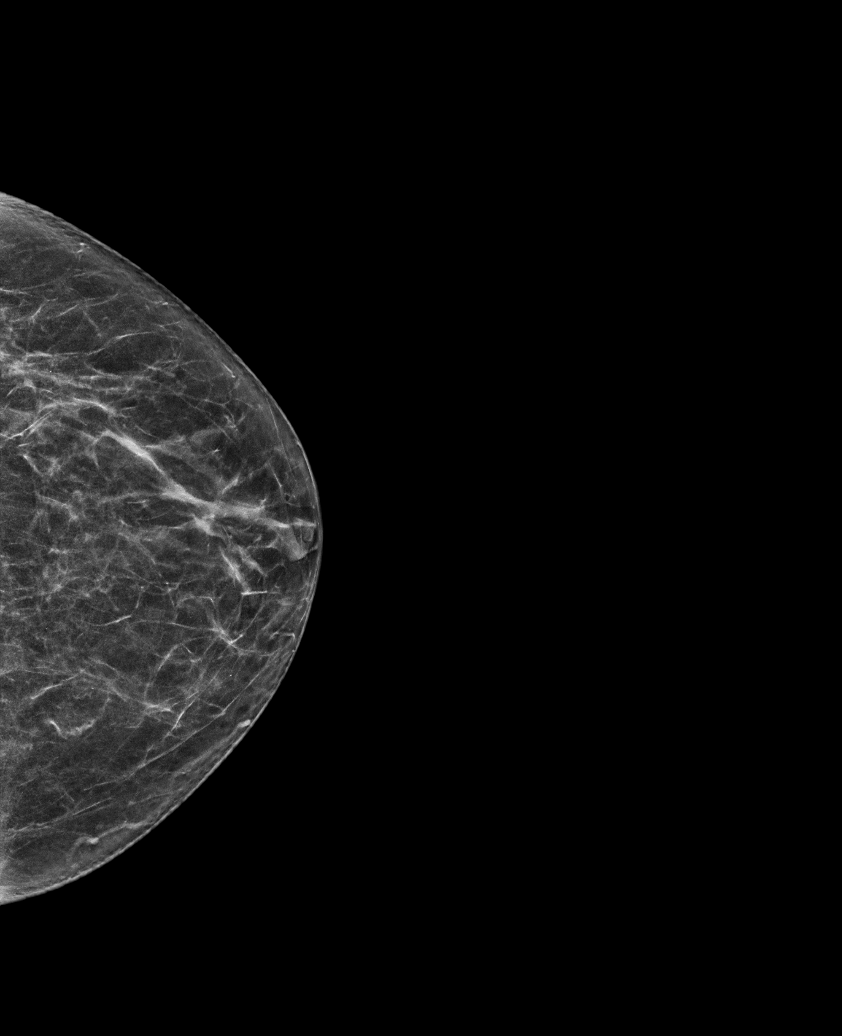

[L MLO synth-2D]
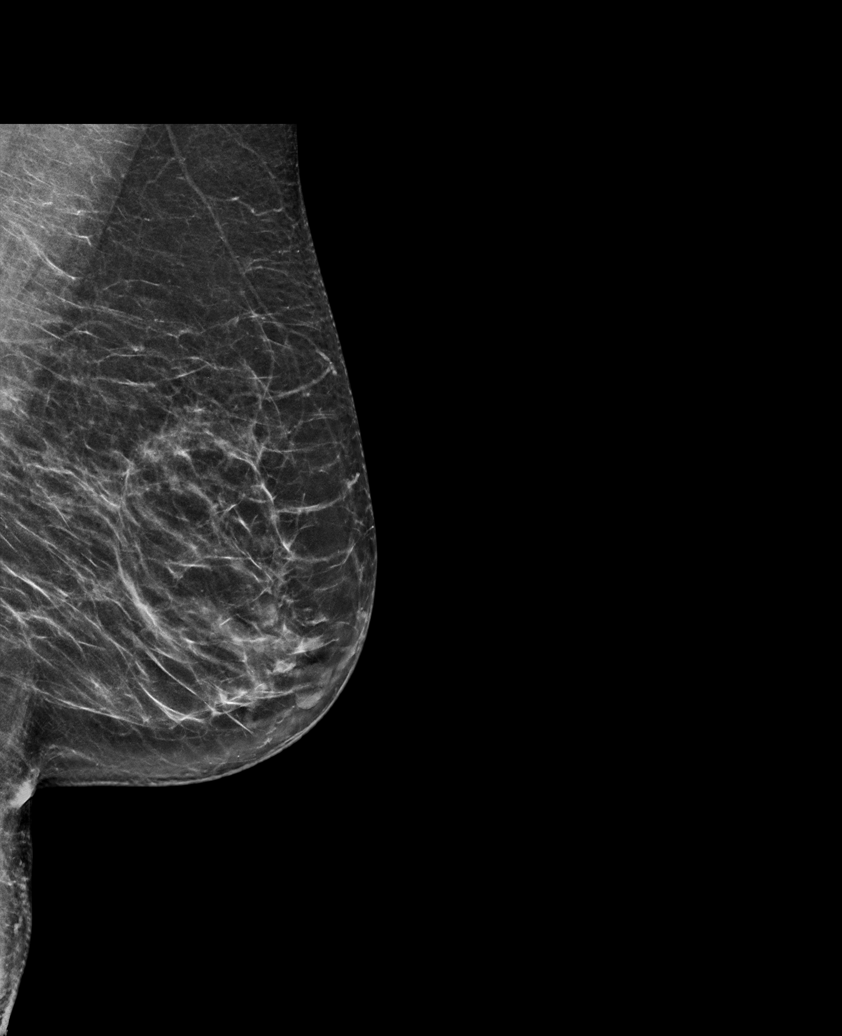

[R MLO synth-2D]
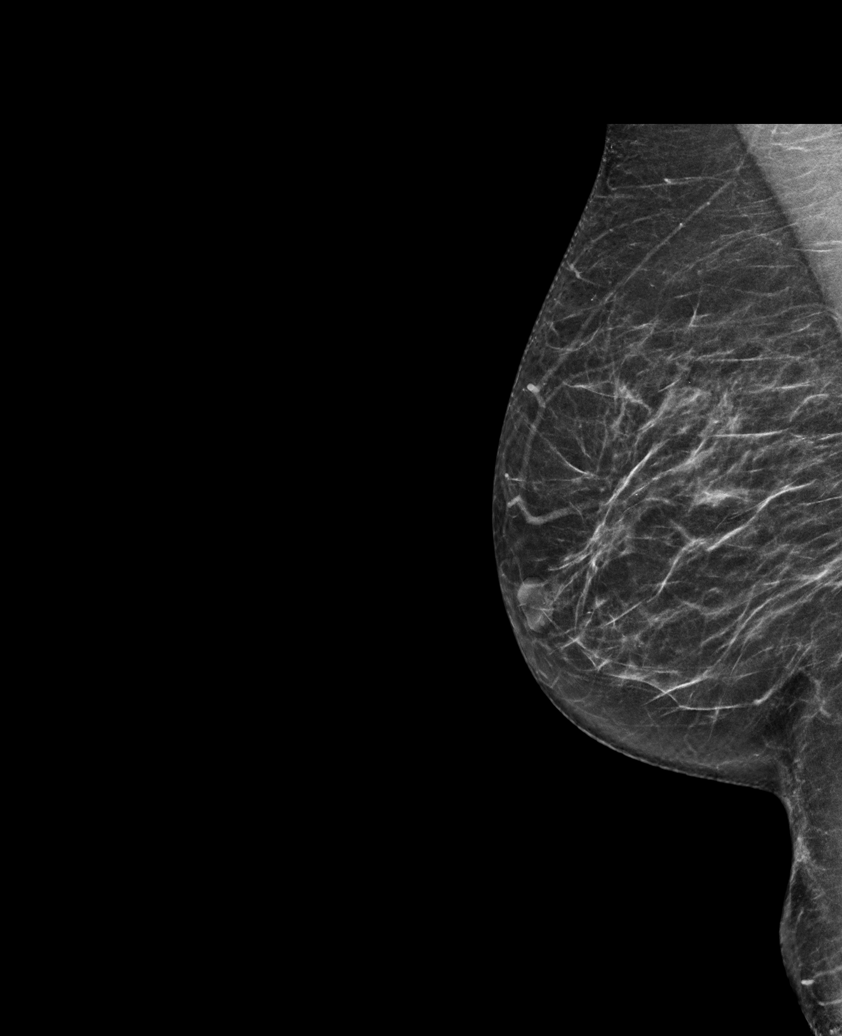

[L CC synth-2D (2 of 2)]
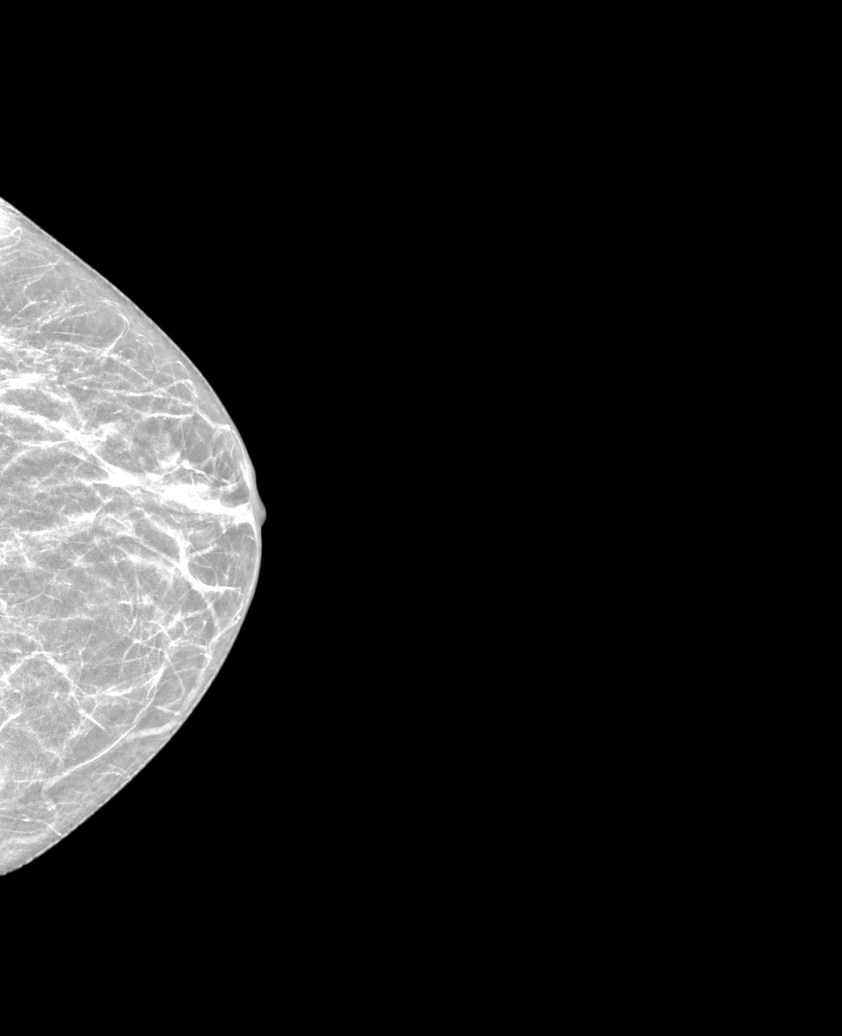

[6 of 36 positions shown; findings below may reference images not displayed]

ACR Breast Density Category b: There are scattered areas of
fibroglandular density.
FINDINGS: There are no findings suspicious for malignancy. Images were
processed with CAD.
IMPRESSION: No mammographic evidence of malignancy. A result letter of this
screening mammogram will be mailed directly to the patient.

RECOMMENDATION:
Screening mammogram in one year. (Code:[id number])

BI-RADS CATEGORY  1: Negative.

## 2019-10-19 ENCOUNTER — Other Ambulatory Visit: Payer: Self-pay | Admitting: Cardiology

## 2019-10-19 DIAGNOSIS — I10 Essential (primary) hypertension: Secondary | ICD-10-CM

## 2019-10-19 DIAGNOSIS — E7800 Pure hypercholesterolemia, unspecified: Secondary | ICD-10-CM

## 2019-10-19 DIAGNOSIS — I5031 Acute diastolic (congestive) heart failure: Secondary | ICD-10-CM

## 2019-10-21 ENCOUNTER — Other Ambulatory Visit: Payer: Self-pay | Admitting: Cardiology

## 2019-11-13 ENCOUNTER — Other Ambulatory Visit: Payer: Self-pay | Admitting: Cardiology

## 2019-11-13 DIAGNOSIS — I10 Essential (primary) hypertension: Secondary | ICD-10-CM

## 2019-11-13 DIAGNOSIS — I5031 Acute diastolic (congestive) heart failure: Secondary | ICD-10-CM

## 2019-11-13 DIAGNOSIS — E7800 Pure hypercholesterolemia, unspecified: Secondary | ICD-10-CM

## 2019-11-23 ENCOUNTER — Other Ambulatory Visit: Payer: Self-pay | Admitting: Cardiology

## 2019-11-23 DIAGNOSIS — I5031 Acute diastolic (congestive) heart failure: Secondary | ICD-10-CM

## 2019-11-23 DIAGNOSIS — E7800 Pure hypercholesterolemia, unspecified: Secondary | ICD-10-CM

## 2019-11-23 DIAGNOSIS — I10 Essential (primary) hypertension: Secondary | ICD-10-CM
